# Patient Record
Sex: Male | Born: 1965
Health system: Southern US, Community
[De-identification: ages and names within clinical notes are randomized; demographics above are authoritative.]

## PROBLEM LIST (undated history)

## (undated) DIAGNOSIS — H7292 Unspecified perforation of tympanic membrane, left ear: Secondary | ICD-10-CM

## (undated) DIAGNOSIS — E8881 Metabolic syndrome: Secondary | ICD-10-CM

## (undated) DIAGNOSIS — E785 Hyperlipidemia, unspecified: Secondary | ICD-10-CM

## (undated) DIAGNOSIS — G4720 Circadian rhythm sleep disorder, unspecified type: Secondary | ICD-10-CM

## (undated) DIAGNOSIS — M1990 Unspecified osteoarthritis, unspecified site: Secondary | ICD-10-CM

## (undated) DIAGNOSIS — I10 Essential (primary) hypertension: Secondary | ICD-10-CM

## (undated) DIAGNOSIS — C4491 Basal cell carcinoma of skin, unspecified: Secondary | ICD-10-CM

## (undated) DIAGNOSIS — T7840XA Allergy, unspecified, initial encounter: Secondary | ICD-10-CM

## (undated) DIAGNOSIS — F419 Anxiety disorder, unspecified: Secondary | ICD-10-CM

## (undated) DIAGNOSIS — E669 Obesity, unspecified: Secondary | ICD-10-CM

## (undated) HISTORY — DX: Basal cell carcinoma of skin, unspecified: C44.91

## (undated) HISTORY — PX: PROSTATE BIOPSY: SHX241

## (undated) HISTORY — PX: TOTAL SHOULDER REPLACEMENT: SUR1217

## (undated) HISTORY — DX: Allergy, unspecified, initial encounter: T78.40XA

## (undated) HISTORY — DX: Metabolic syndrome: E88.810

## (undated) HISTORY — DX: Unspecified perforation of tympanic membrane, left ear: H72.92

## (undated) HISTORY — DX: Metabolic syndrome: E88.81

## (undated) HISTORY — DX: Essential (primary) hypertension: I10

## (undated) HISTORY — PX: SHOULDER ARTHROSCOPY: SHX128

## (undated) HISTORY — DX: Unspecified osteoarthritis, unspecified site: M19.90

## (undated) HISTORY — DX: Obesity, unspecified: E66.9

## (undated) HISTORY — DX: Hyperlipidemia, unspecified: E78.5

## (undated) HISTORY — DX: Circadian rhythm sleep disorder, unspecified type: G47.20

## (undated) HISTORY — DX: Anxiety disorder, unspecified: F41.9

---

## 2013-07-07 LAB — LIPID PANEL
CHOLESTEROL: 156 mg/dL (ref 0–200)
HDL: 32 mg/dL — AB (ref 35–70)
LDL CALC: 39 mg/dL
Triglycerides: 197 mg/dL — AB (ref 40–160)

## 2013-07-07 LAB — HEMOGLOBIN A1C: Hgb A1c MFr Bld: 5.8 % (ref 4.0–6.0)

## 2013-07-07 LAB — PSA: PSA: 1.8

## 2014-04-09 DIAGNOSIS — C4491 Basal cell carcinoma of skin, unspecified: Secondary | ICD-10-CM

## 2014-04-09 HISTORY — DX: Basal cell carcinoma of skin, unspecified: C44.91

## 2014-10-19 ENCOUNTER — Other Ambulatory Visit: Payer: Self-pay | Admitting: Family Medicine

## 2014-10-19 ENCOUNTER — Telehealth: Payer: Self-pay | Admitting: Family Medicine

## 2014-10-19 NOTE — Telephone Encounter (Signed)
Patient needs refills on Buspirone, Montelukast sodium, Lovastatin to be sent to [facility]. Patient is completely out and will be going out of town tomorrow for 3 wks.

## 2014-10-19 NOTE — Telephone Encounter (Signed)
Patient requesting refill. 

## 2014-10-19 NOTE — Addendum Note (Signed)
Addended by: Steele Sizer F on: 10/19/2014 10:12 PM   Modules accepted: Orders

## 2014-10-20 MED ORDER — MONTELUKAST SODIUM 10 MG PO TABS: 10.0000 mg | ORAL_TABLET | Freq: Every day | ORAL | Status: DC

## 2014-10-20 MED ORDER — ATORVASTATIN CALCIUM 40 MG PO TABS: 40.0000 mg | ORAL_TABLET | Freq: Every day | ORAL | Status: DC

## 2014-10-20 MED ORDER — BUSPIRONE HCL 15 MG PO TABS: 15.0000 mg | ORAL_TABLET | Freq: Two times a day (BID) | ORAL | Status: DC

## 2014-11-17 ENCOUNTER — Other Ambulatory Visit: Payer: Self-pay | Admitting: Family Medicine

## 2014-11-22 ENCOUNTER — Ambulatory Visit: Payer: Self-pay | Admitting: Family Medicine

## 2014-11-27 ENCOUNTER — Ambulatory Visit: Payer: Self-pay | Admitting: Family Medicine

## 2014-12-15 ENCOUNTER — Other Ambulatory Visit: Payer: Self-pay | Admitting: Family Medicine

## 2014-12-18 ENCOUNTER — Encounter: Payer: Self-pay | Admitting: Family Medicine

## 2014-12-18 DIAGNOSIS — H7290 Unspecified perforation of tympanic membrane, unspecified ear: Secondary | ICD-10-CM | POA: Insufficient documentation

## 2014-12-18 DIAGNOSIS — J302 Other seasonal allergic rhinitis: Secondary | ICD-10-CM | POA: Insufficient documentation

## 2014-12-18 DIAGNOSIS — C4491 Basal cell carcinoma of skin, unspecified: Secondary | ICD-10-CM | POA: Insufficient documentation

## 2014-12-18 DIAGNOSIS — T753XXA Motion sickness, initial encounter: Secondary | ICD-10-CM | POA: Insufficient documentation

## 2014-12-18 DIAGNOSIS — F411 Generalized anxiety disorder: Secondary | ICD-10-CM | POA: Insufficient documentation

## 2014-12-18 DIAGNOSIS — R739 Hyperglycemia, unspecified: Secondary | ICD-10-CM | POA: Insufficient documentation

## 2014-12-18 DIAGNOSIS — Z789 Other specified health status: Secondary | ICD-10-CM | POA: Insufficient documentation

## 2014-12-18 DIAGNOSIS — G4720 Circadian rhythm sleep disorder, unspecified type: Secondary | ICD-10-CM | POA: Insufficient documentation

## 2014-12-18 DIAGNOSIS — E785 Hyperlipidemia, unspecified: Secondary | ICD-10-CM | POA: Insufficient documentation

## 2014-12-18 DIAGNOSIS — Z9889 Other specified postprocedural states: Secondary | ICD-10-CM | POA: Insufficient documentation

## 2014-12-18 DIAGNOSIS — J3089 Other allergic rhinitis: Secondary | ICD-10-CM

## 2014-12-18 DIAGNOSIS — E669 Obesity, unspecified: Secondary | ICD-10-CM | POA: Insufficient documentation

## 2014-12-18 DIAGNOSIS — I10 Essential (primary) hypertension: Secondary | ICD-10-CM | POA: Insufficient documentation

## 2014-12-18 DIAGNOSIS — E8881 Metabolic syndrome: Secondary | ICD-10-CM | POA: Insufficient documentation

## 2014-12-22 ENCOUNTER — Other Ambulatory Visit: Payer: Self-pay | Admitting: Family Medicine

## 2014-12-26 LAB — LIPID PANEL
CHOLESTEROL: 150 mg/dL (ref 0–200)
HDL: 29 mg/dL — AB (ref 35–70)
LDL Cholesterol: 53 mg/dL
Triglycerides: 263 mg/dL — AB (ref 40–160)

## 2014-12-26 LAB — PSA: PSA: 2.3

## 2014-12-26 LAB — HEMOGLOBIN A1C: HEMOGLOBIN A1C: 6.1 % — AB (ref 4.0–6.0)

## 2015-01-04 ENCOUNTER — Encounter: Payer: Self-pay | Admitting: Family Medicine

## 2015-01-04 ENCOUNTER — Ambulatory Visit (INDEPENDENT_AMBULATORY_CARE_PROVIDER_SITE_OTHER): Payer: 59 | Admitting: Family Medicine

## 2015-01-04 VITALS — BP 128/68 | HR 69 | Temp 97.9°F | Resp 16 | Ht 74.0 in | Wt 238.5 lb

## 2015-01-04 DIAGNOSIS — E8881 Metabolic syndrome: Secondary | ICD-10-CM

## 2015-01-04 DIAGNOSIS — E785 Hyperlipidemia, unspecified: Secondary | ICD-10-CM | POA: Diagnosis not present

## 2015-01-04 DIAGNOSIS — R300 Dysuria: Secondary | ICD-10-CM

## 2015-01-04 DIAGNOSIS — F329 Major depressive disorder, single episode, unspecified: Secondary | ICD-10-CM

## 2015-01-04 DIAGNOSIS — Z23 Encounter for immunization: Secondary | ICD-10-CM | POA: Diagnosis not present

## 2015-01-04 DIAGNOSIS — F32A Depression, unspecified: Secondary | ICD-10-CM

## 2015-01-04 DIAGNOSIS — E669 Obesity, unspecified: Secondary | ICD-10-CM

## 2015-01-04 DIAGNOSIS — F520 Hypoactive sexual desire disorder: Secondary | ICD-10-CM | POA: Diagnosis not present

## 2015-01-04 DIAGNOSIS — F411 Generalized anxiety disorder: Secondary | ICD-10-CM | POA: Diagnosis not present

## 2015-01-04 DIAGNOSIS — J3089 Other allergic rhinitis: Secondary | ICD-10-CM

## 2015-01-04 DIAGNOSIS — I10 Essential (primary) hypertension: Secondary | ICD-10-CM | POA: Diagnosis not present

## 2015-01-04 DIAGNOSIS — J309 Allergic rhinitis, unspecified: Secondary | ICD-10-CM | POA: Diagnosis not present

## 2015-01-04 MED ORDER — BUSPIRONE HCL 15 MG PO TABS: 15.0000 mg | ORAL_TABLET | Freq: Two times a day (BID) | ORAL | Status: DC

## 2015-01-04 MED ORDER — ATORVASTATIN CALCIUM 40 MG PO TABS: 40.0000 mg | ORAL_TABLET | Freq: Every day | ORAL | Status: DC

## 2015-01-04 MED ORDER — MONTELUKAST SODIUM 10 MG PO TABS: 10.0000 mg | ORAL_TABLET | Freq: Every day | ORAL | Status: DC

## 2015-01-04 MED ORDER — TELMISARTAN-HCTZ 80-25 MG PO TABS
1.0000 | ORAL_TABLET | Freq: Every day | ORAL | Status: DC
Start: 2015-01-04 — End: 2015-08-19

## 2015-01-04 NOTE — Progress Notes (Signed)
Name: Gavin Scott   MRN: [id number]    DOB: Jul 01, 1965   Date:01/04/2015       Progress Note  Subjective  Chief Complaint  Chief Complaint  Patient presents with  . Medication Refill    3 month F/U  . Hypertension    Edema-bilateral feet and ankles(maybe due to flying for work), Headache, Checks BP at home and gets 120/80's  . Depression    Still taking Buspar, but could not tolerate Celexa due to making him extremely tired and sleepy-only could tolerate for a month and quit   . Hyperlipidemia  . Allergic Rhinitis     Still sniffing alot due to traveling and climate changes    HPI   HTN: taking medication , denies side effects of medication. BP has been at goal at home.  Depression/GAD: still very stressed, placing father in a long term memory care facility. He also has more responsibilities at work, son is going to [employer] and misses him. Worries about 56 yo daughter. He could not tolerate Celexa because it was causing him to get sleepy. Has occasional crying spells. Tolerating Buspar. He does not want to try any SSRI at this time. Denies suicidal thoughts or ideation  Dyslipidemia: LDL is at goal, HDL is very low.   AR: doing well on current medication   Dysuria: states occasionally has a burning sensation when he void. No hematuria, no groin pain, no flank pain  Obesity: travelling more for work, gaining weight, eating out on a regular basis  Decrease in Libido: he states he rather go to sleep than to have sex.    Patient Active Problem List   Diagnosis Date Noted  . Anxiety 12/18/2014  . Basal cell carcinoma 12/18/2014  . Benign essential HTN 12/18/2014  . Circadian rhythm disorder 12/18/2014  . History of prostate biopsy 12/18/2014  . Dyslipidemia 12/18/2014  . Blood glucose elevated 12/18/2014  . Obesity (BMI 30.0-34.9) 12/18/2014  . Allergic rhinitis 12/18/2014  . Ear drum perforation 12/18/2014  . Motion sickness 12/18/2014  . Engages in travel abroad  12/18/2014  . Dysmetabolic syndrome 96/22/2979    Past Surgical History  Procedure Laterality Date  . Prostate biopsy      [facility]-Negative   . Total shoulder replacement Right     Outpatient     Family History  Problem Relation Age of Onset  . Hyperlipidemia Mother   . Alcohol abuse Father   . Stroke Father   . Hyperlipidemia Father     Social History   Social History  . Marital Status: Married    Spouse Name: N/A  . Number of Children: N/A  . Years of Education: N/A   Occupational History  . Not on file.   Social History Main Topics  . Smoking status: Never Smoker   . Smokeless tobacco: Former Systems developer    Types: Snuff, Chew  . Alcohol Use: 0.0 oz/week    0 Standard drinks or equivalent per week     Comment: occasionally  . Drug Use: No  . Sexual Activity:    Partners: Female   Other Topics Concern  . Not on file   Social History Narrative     Current outpatient prescriptions:  .  aspirin 81 MG tablet, Take by mouth., Disp: , Rfl:  .  atorvastatin (LIPITOR) 40 MG tablet, Take 1 tablet (40 mg total) by mouth daily., Disp: 30 tablet, Rfl: 5 .  busPIRone (BUSPAR) 15 MG tablet, Take 1 tablet (15 mg  total) by mouth 2 (two) times daily., Disp: 60 tablet, Rfl: 5 .  fexofenadine (ALLEGRA ALLERGY) 180 MG tablet, Take by mouth., Disp: , Rfl:  .  fluticasone (FLONASE) 50 MCG/ACT nasal spray, Place into the nose., Disp: , Rfl:  .  montelukast (SINGULAIR) 10 MG tablet, Take 1 tablet (10 mg total) by mouth at bedtime., Disp: 30 tablet, Rfl: 5 .  telmisartan-hydrochlorothiazide (MICARDIS HCT) 80-25 MG per tablet, Take 1 tablet by mouth daily., Disp: 30 tablet, Rfl: 5 .  zolpidem (AMBIEN) 5 MG tablet, Take by mouth., Disp: , Rfl:   Allergies  Allergen Reactions  . Ace Inhibitors Cough  . Codeine Hives and Itching     ROS  Constitutional: Negative for fever or weight change.  Respiratory: Negative for cough and shortness of breath.   Cardiovascular: Negative  for chest pain or palpitations.  Gastrointestinal: Negative for abdominal pain, no bowel changes.  Musculoskeletal: Negative for gait problem or joint swelling.  Skin: Negative for rash.  Neurological: Negative for dizziness or headache. Occasional right hand tremors with movement, seldom - no balance problems or any other changes No other specific complaints in a complete review of systems (except as listed in HPI above).  Objective  Filed Vitals:   01/04/15 1109  BP: 128/68  Pulse: 69  Temp: 97.9 F (36.6 C)  TempSrc: Oral  Resp: 16  Height: 6\' 2"  (1.88 m)  Weight: 238 lb 8 oz (108.183 kg)  SpO2: 98%    Body mass index is 30.61 kg/(m^2).  Physical Exam  Constitutional: Patient appears well-developed and well-nourished. Obese  No distress.  HEENT: head atraumatic, normocephalic, pupils equal and reactive to light, neck supple, throat within normal limits Cardiovascular: Normal rate, regular rhythm and normal heart sounds.  No murmur heard. No BLE edema. Pulmonary/Chest: Effort normal and breath sounds normal. No respiratory distress. Abdominal: Soft.  There is no tenderness. Psychiatric: Patient has a normal mood and affect. behavior is normal. Judgment and thought content normal.  Recent Results (from the past 2160 hour(s))  Lipid panel     Status: Abnormal   Collection Time: 12/26/14 12:00 AM  Result Value Ref Range   Triglycerides 263 (A) 40 - 160 mg/dL   Cholesterol 150 0 - 200 mg/dL   HDL 29 (A) 35 - 70 mg/dL   LDL Cholesterol 53 mg/dL  Hemoglobin A1c     Status: Abnormal   Collection Time: 12/26/14 12:00 AM  Result Value Ref Range   Hgb A1c MFr Bld 6.1 (A) 4.0 - 6.0 %  PSA     Status: None   Collection Time: 12/26/14 12:00 AM  Result Value Ref Range   PSA 2.3     PHQ2/9: Depression screen PHQ 2/9 01/04/2015  Decreased Interest 2  Down, Depressed, Hopeless 2  PHQ - 2 Score 4  Altered sleeping 0  Tired, decreased energy 2  Change in appetite 2  Feeling  bad or failure about yourself  2  Trouble concentrating 0  Moving slowly or fidgety/restless 0  Suicidal thoughts 0  PHQ-9 Score 10  Difficult doing work/chores Somewhat difficult     Fall Risk: Fall Risk  01/04/2015  Falls in the past year? No      Functional Status Survey: Is the patient deaf or have difficulty hearing?: No Does the patient have difficulty seeing, even when wearing glasses/contacts?: No Does the patient have difficulty concentrating, remembering, or making decisions?: No Does the patient have difficulty walking or climbing stairs?: No Does the  patient have difficulty dressing or bathing?: No Does the patient have difficulty doing errands alone such as visiting a doctor's office or shopping?: No   Assessment & Plan  1. Benign essential HTN  continue medication  - telmisartan-hydrochlorothiazide (MICARDIS HCT) 80-25 MG per tablet; Take 1 tablet by mouth daily.  Dispense: 30 tablet; Refill: 5  2. Needs flu shot  - Flu Vaccine QUAD 36+ mos PF IM (Fluarix & Fluzone Quad PF)  3. Dysmetabolic syndrome  I will give him some dietary information   4. Obesity (BMI 30.0-34.9)  Discussed with the patient the risk posed by an increased BMI. Discussed importance of portion control, calorie counting and at least 150 minutes of physical activity weekly. Avoid sweet beverages and drink more water. Eat at least 6 servings of fruit and vegetables daily   5. GAD (generalized anxiety disorder)  - busPIRone (BUSPAR) 15 MG tablet; Take 1 tablet (15 mg total) by mouth 2 (two) times daily.  Dispense: 60 tablet; Refill: 5  6. Perennial allergic rhinitis  - montelukast (SINGULAIR) 10 MG tablet; Take 1 tablet (10 mg total) by mouth at bedtime.  Dispense: 30 tablet; Refill: 5  7. Dyslipidemia  Lipid panel shows low HDL : to improve HDL patient  needs to eat tree nuts ( pecans/pistachios/almonds ) four times weekly, eat fish two times weekly  and exercise  at least 150 minutes  per week - atorvastatin (LIPITOR) 40 MG tablet; Take 1 tablet (40 mg total) by mouth daily.  Dispense: 30 tablet; Refill: 5  8. Depression   likely the cause of lack of libido, but he wants hold off on medication. Advised a weekend get away with his wife to see if libido improves  9. Dysuria   advised to drink more water - Urine culture  10. Lack of libido  Likely from depression, we will hold off on labs

## 2015-01-05 LAB — URINE CULTURE: Organism ID, Bacteria: NO GROWTH

## 2015-01-07 NOTE — Progress Notes (Signed)
Pt.notified

## 2015-04-02 ENCOUNTER — Ambulatory Visit: Payer: 59 | Admitting: Family Medicine

## 2015-04-29 ENCOUNTER — Ambulatory Visit (INDEPENDENT_AMBULATORY_CARE_PROVIDER_SITE_OTHER): Payer: 59 | Admitting: Family Medicine

## 2015-04-29 ENCOUNTER — Encounter: Payer: Self-pay | Admitting: Family Medicine

## 2015-04-29 VITALS — BP 130/69 | HR 115 | Temp 99.8°F | Resp 19 | Ht 74.0 in | Wt 240.3 lb

## 2015-04-29 DIAGNOSIS — J0100 Acute maxillary sinusitis, unspecified: Secondary | ICD-10-CM

## 2015-04-29 DIAGNOSIS — H65116 Acute and subacute allergic otitis media (mucoid) (sanguinous) (serous), recurrent, bilateral: Secondary | ICD-10-CM

## 2015-04-29 DIAGNOSIS — R053 Chronic cough: Secondary | ICD-10-CM

## 2015-04-29 DIAGNOSIS — H6503 Acute serous otitis media, bilateral: Secondary | ICD-10-CM | POA: Diagnosis not present

## 2015-04-29 DIAGNOSIS — R05 Cough: Secondary | ICD-10-CM

## 2015-04-29 DIAGNOSIS — J3089 Other allergic rhinitis: Secondary | ICD-10-CM

## 2015-04-29 DIAGNOSIS — J40 Bronchitis, not specified as acute or chronic: Secondary | ICD-10-CM | POA: Diagnosis not present

## 2015-04-29 MED ORDER — DEXTROMETHORPHAN-GUAIFENESIN 5-100 MG/5ML PO LIQD: 2.0000 | Freq: Two times a day (BID) | ORAL | Status: DC

## 2015-04-29 MED ORDER — CEFTRIAXONE SODIUM 1 G IJ SOLR: 1.0000 g | Freq: Once | INTRAMUSCULAR | Status: DC

## 2015-04-29 MED ORDER — PREDNISONE 20 MG PO TABS: 20.0000 mg | ORAL_TABLET | Freq: Two times a day (BID) | ORAL | Status: DC

## 2015-04-29 MED ORDER — TRAMADOL HCL 50 MG PO TABS: 50.0000 mg | ORAL_TABLET | Freq: Three times a day (TID) | ORAL | Status: DC | PRN

## 2015-04-29 MED ORDER — LEVOFLOXACIN 500 MG PO TABS: 500.0000 mg | ORAL_TABLET | Freq: Every day | ORAL | Status: DC

## 2015-04-29 NOTE — Progress Notes (Signed)
Name: Gavin Scott   MRN: [id number]    DOB: 12/15/1965   Date:04/29/2015       Progress Note  Subjective  Chief Complaint  Chief Complaint  Patient presents with  . Acute Visit    Sinus    HPI  Sinusitis  Patient presents with greater than 7 day history of nasal congestion and drainage which is purulent in color. There is tenderness over the sinuses. There has been fever to 101 along with some associated chills on occasion. Usage of over-the-counter medications is not been affected. There is also accompanying cough productive of purulent sputum.  Bronchitis  Patient presents with a greater than 1 week history of cough productive of purulent sputum. The cough is irritating and keep the patient awake at night. There has no fever or chills.  Over-the-counter meds And completely effective.   Persistent cough. Patient has had a cough now for 3 or 4 weeks. It is productive of purulent sputum and has occasional skin blood tinge was very severe he's had chest wallpain associated.    Past Medical History  Diagnosis Date  . Hyperlipidemia   . Hypertension   . Obesity   . Perforation of left tympanic membrane   . Allergy   . Circadian rhythm disorder   . Metabolic syndrome   . Anxiety   . Basal cell carcinoma     Dr. Tyler Deis    Social History  Substance Use Topics  . Smoking status: Never Smoker   . Smokeless tobacco: Former Systems developer    Types: Snuff, Chew  . Alcohol Use: 0.0 oz/week    0 Standard drinks or equivalent per week     Comment: occasionally     Current outpatient prescriptions:  .  aspirin 81 MG tablet, Take by mouth., Disp: , Rfl:  .  atorvastatin (LIPITOR) 40 MG tablet, Take 1 tablet (40 mg total) by mouth daily., Disp: 30 tablet, Rfl: 5 .  busPIRone (BUSPAR) 15 MG tablet, Take 1 tablet (15 mg total) by mouth 2 (two) times daily., Disp: 60 tablet, Rfl: 5 .  fexofenadine (ALLEGRA ALLERGY) 180 MG tablet, Take by mouth., Disp: , Rfl:  .  fluticasone (FLONASE) 50  MCG/ACT nasal spray, Place into the nose., Disp: , Rfl:  .  telmisartan-hydrochlorothiazide (MICARDIS HCT) 80-25 MG per tablet, Take 1 tablet by mouth daily., Disp: 30 tablet, Rfl: 5 .  zolpidem (AMBIEN) 5 MG tablet, Take by mouth., Disp: , Rfl:   Allergies  Allergen Reactions  . Ace Inhibitors Cough  . Codeine Hives and Itching    Review of Systems  Constitutional: Positive for fever, chills and malaise/fatigue. Negative for weight loss.  HENT: Positive for congestion. Negative for hearing loss, sore throat and tinnitus.   Eyes: Negative for blurred vision, double vision and redness.  Respiratory: Positive for cough and sputum production. Negative for hemoptysis and shortness of breath.   Cardiovascular: Positive for chest pain. Negative for palpitations, orthopnea, claudication and leg swelling.  Gastrointestinal: Negative for heartburn, nausea, vomiting, diarrhea, constipation and blood in stool.  Genitourinary: Negative for dysuria, urgency, frequency and hematuria.  Musculoskeletal: Positive for myalgias. Negative for back pain, joint pain, falls and neck pain.  Skin: Negative for itching.  Neurological: Negative for dizziness, tingling, tremors, focal weakness, seizures, loss of consciousness, weakness and headaches.  Endo/Heme/Allergies: Does not bruise/bleed easily.  Psychiatric/Behavioral: Negative for depression and substance abuse. The patient is not nervous/anxious and does not have insomnia.      Objective  Filed Vitals:  04/29/15 1327  BP: 130/69  Pulse: 115  Temp: 99.8 F (37.7 C)  TempSrc: Oral  Resp: 19  Height: 6\' 2"  (1.88 m)  Weight: 240 lb 4.8 oz (108.999 kg)  SpO2: 93%     Physical Exam  Constitutional: He is oriented to person, place, and time and well-developed, well-nourished, and in no distress.  HENT:  Head: Normocephalic.  Eyes: EOM are normal. Pupils are equal, round, and reactive to light.  Bilateral frontal and maxillary sinus tenderness.  The nasal turbinates red swollen with mucopurulent discharge pharynx is injected with modest amount of discharge. Cervical no fluid on the left is tender. Ears show bilateral dullness and decreased mobility.  Neck: Normal range of motion. Neck supple. No thyromegaly present.  Cardiovascular: Normal rate, regular rhythm and normal heart sounds.   No murmur heard. Pulmonary/Chest: Effort normal and breath sounds normal. No respiratory distress. He has no wheezes.  Abdominal: Soft. Bowel sounds are normal.  Musculoskeletal: Normal range of motion. He exhibits no edema.  Lymphadenopathy:    He has no cervical adenopathy.  Neurological: He is alert and oriented to person, place, and time. No cranial nerve deficit. Gait normal. Coordination normal.  Skin: Skin is warm and dry. No rash noted.  Psychiatric: Affect and judgment normal.       Assessment & Plan   1. Acute maxillary sinusitis, recurrence not specified Persistent - predniSONE (DELTASONE) 20 MG tablet; Take 1 tablet (20 mg total) by mouth 2 (two) times daily with a meal.  Dispense: 10 tablet; Refill: 0 - levofloxacin (LEVAQUIN) 500 MG tablet; Take 1 tablet (500 mg total) by mouth daily.  Dispense: 10 tablet; Refill: 0 - cefTRIAXone (ROCEPHIN) injection 1 g; Inject 1 g into the muscle once.  2. Bronchitis Persistent - predniSONE (DELTASONE) 20 MG tablet; Take 1 tablet (20 mg total) by mouth 2 (two) times daily with a meal.  Dispense: 10 tablet; Refill: 0 - levofloxacin (LEVAQUIN) 500 MG tablet; Take 1 tablet (500 mg total) by mouth daily.  Dispense: 10 tablet; Refill: 0 - cefTRIAXone (ROCEPHIN) injection 1 g; Inject 1 g into the muscle once.  3. Recurrent acute allergic otitis media of both ears   4. Persistent cough Persistent for several months - traMADol (ULTRAM) 50 MG tablet; Take 1 tablet (50 mg total) by mouth every 8 (eight) hours as needed.  Dispense: 30 tablet; Refill: 0 - predniSONE (DELTASONE) 20 MG tablet; Take 1  tablet (20 mg total) by mouth 2 (two) times daily with a meal.  Dispense: 10 tablet; Refill: 0 - Dextromethorphan-Guaifenesin (DELSYM [facility]/CHEST CONG DM CHILD) 5-100 MG/5ML LIQD; Take 2 Doses by mouth 2 (two) times daily.  Dispense: 1 Bottle; Refill: 0 - cefTRIAXone (ROCEPHIN) injection 1 g; Inject 1 g into the muscle once.  5. Other allergic rhinitis Moderate  6. Bilateral acute serous otitis media, recurrence not specified Moderate in severity - cefTRIAXone (ROCEPHIN) injection 1 g; Inject 1 g into the muscle once.

## 2015-04-30 ENCOUNTER — Ambulatory Visit: Payer: 59 | Admitting: Family Medicine

## 2015-05-02 ENCOUNTER — Encounter: Payer: Self-pay | Admitting: Family Medicine

## 2015-05-02 ENCOUNTER — Ambulatory Visit (INDEPENDENT_AMBULATORY_CARE_PROVIDER_SITE_OTHER): Payer: 59 | Admitting: Family Medicine

## 2015-05-02 VITALS — BP 120/70 | HR 84 | Temp 98.9°F | Resp 18 | Ht 74.0 in | Wt 240.4 lb

## 2015-05-02 DIAGNOSIS — J40 Bronchitis, not specified as acute or chronic: Secondary | ICD-10-CM

## 2015-05-02 DIAGNOSIS — R053 Chronic cough: Secondary | ICD-10-CM

## 2015-05-02 DIAGNOSIS — R05 Cough: Secondary | ICD-10-CM

## 2015-05-02 DIAGNOSIS — J0100 Acute maxillary sinusitis, unspecified: Secondary | ICD-10-CM

## 2015-05-02 MED ORDER — CEFTRIAXONE SODIUM 1 G IJ SOLR: 1.0000 g | Freq: Once | INTRAMUSCULAR | Status: DC

## 2015-05-02 NOTE — Progress Notes (Signed)
Name: Gavin Scott   MRN: [id number]    DOB: 1965/11/01   Date:05/02/2015       Progress Note  Subjective  Chief Complaint  Chief Complaint  Patient presents with  . Bronchitis     follow up/ feeling better  . Otalgia    HPI  Sinusitis and bronchitis  Since being seen 2 days ago and given a shot of Rocephin and start placed on Levaquin patient's noticed some improvement of his symptomatology. He does the lower having chills and fever and cough is been less productive though he still. Phlegm in his tracheal area. Sinus drainage is improved no shaking chills. There  Past Medical History  Diagnosis Date  . Hyperlipidemia   . Hypertension   . Obesity   . Perforation of left tympanic membrane   . Allergy   . Circadian rhythm disorder   . Metabolic syndrome   . Anxiety   . Basal cell carcinoma     Dr. Tyler Deis    Social History  Substance Use Topics  . Smoking status: Never Smoker   . Smokeless tobacco: Former Systems developer    Types: Snuff, Chew  . Alcohol Use: 0.0 oz/week    0 Standard drinks or equivalent per week     Comment: occasionally     Current outpatient prescriptions:  .  aspirin 81 MG tablet, Take by mouth., Disp: , Rfl:  .  atorvastatin (LIPITOR) 40 MG tablet, Take 1 tablet (40 mg total) by mouth daily., Disp: 30 tablet, Rfl: 5 .  busPIRone (BUSPAR) 15 MG tablet, Take 1 tablet (15 mg total) by mouth 2 (two) times daily., Disp: 60 tablet, Rfl: 5 .  Dextromethorphan-Guaifenesin (DELSYM [facility]/CHEST CONG DM CHILD) 5-100 MG/5ML LIQD, Take 2 Doses by mouth 2 (two) times daily., Disp: 1 Bottle, Rfl: 0 .  fexofenadine (ALLEGRA ALLERGY) 180 MG tablet, Take by mouth., Disp: , Rfl:  .  fluticasone (FLONASE) 50 MCG/ACT nasal spray, Place into the nose., Disp: , Rfl:  .  levofloxacin (LEVAQUIN) 500 MG tablet, Take 1 tablet (500 mg total) by mouth daily., Disp: 10 tablet, Rfl: 0 .  predniSONE (DELTASONE) 20 MG tablet, Take 1 tablet (20 mg total) by mouth 2 (two) times daily with a  meal., Disp: 10 tablet, Rfl: 0 .  telmisartan-hydrochlorothiazide (MICARDIS HCT) 80-25 MG per tablet, Take 1 tablet by mouth daily., Disp: 30 tablet, Rfl: 5 .  traMADol (ULTRAM) 50 MG tablet, Take 1 tablet (50 mg total) by mouth every 8 (eight) hours as needed., Disp: 30 tablet, Rfl: 0 .  zolpidem (AMBIEN) 5 MG tablet, Take by mouth., Disp: , Rfl:   Current facility-administered medications:  .  cefTRIAXone (ROCEPHIN) injection 1 g, 1 g, Intramuscular, Once, Ashok Norris, MD .  cefTRIAXone (ROCEPHIN) injection 1 g, 1 g, Intramuscular, Once, Ashok Norris, MD  Allergies  Allergen Reactions  . Ace Inhibitors Cough  . Codeine Hives and Itching    Review of Systems  Constitutional: Positive for fever. Negative for chills and weight loss.  HENT: Positive for congestion. Negative for hearing loss, sore throat and tinnitus.   Eyes: Negative for blurred vision, double vision and redness.  Respiratory: Positive for cough and sputum production. Negative for hemoptysis and shortness of breath.   Cardiovascular: Negative for chest pain, palpitations, orthopnea, claudication and leg swelling.  Gastrointestinal: Negative for heartburn, nausea, vomiting, diarrhea, constipation and blood in stool.  Genitourinary: Negative for dysuria, urgency, frequency and hematuria.  Musculoskeletal: Negative for myalgias, back pain, joint pain, falls  and neck pain.  Skin: Negative for itching.  Neurological: Positive for headaches. Negative for dizziness, tingling, tremors, focal weakness, seizures, loss of consciousness and weakness.  Endo/Heme/Allergies: Does not bruise/bleed easily.  Psychiatric/Behavioral: Negative for depression and substance abuse. The patient is not nervous/anxious and does not have insomnia.      Objective  Filed Vitals:   05/02/15 0728  BP: 120/70  Pulse: 84  Temp: 98.9 F (37.2 C)  TempSrc: Oral  Resp: 18  Height: 6\' 2"  (1.88 m)  Weight: 240 lb 6.4 oz (109.045 kg)  SpO2:  96%     Physical Exam  Constitutional: He is oriented to person, place, and time and well-developed, well-nourished, and in no distress.  HENT:  Head: Normocephalic.  Decrease in nasal turbinate swelling and drainage and erythema  Eyes: EOM are normal. Pupils are equal, round, and reactive to light.  Neck: Normal range of motion. Neck supple. No thyromegaly present.  Cardiovascular: Normal rate, regular rhythm and normal heart sounds.   No murmur heard. Pulmonary/Chest: Effort normal and breath sounds normal. No respiratory distress. He has no wheezes.  Abdominal: Soft. Bowel sounds are normal.  Musculoskeletal: Normal range of motion. He exhibits no edema.  Lymphadenopathy:    He has no cervical adenopathy.  Neurological: He is alert and oriented to person, place, and time. No cranial nerve deficit. Gait normal. Coordination normal.  Skin: Skin is warm and dry. No rash noted.  Psychiatric: Affect and judgment normal.      Assessment & Plan  1. Bronchitis Improving but not resolved. We'll give another gram of Rocephin in view of his out of town travel in a few days - cefTRIAXone (ROCEPHIN) injection 1 g; Inject 1 g into the muscle once.  2. Acute maxillary sinusitis, recurrence not specified Improvement resolved  3. Persistent cough Continue with present regimen

## 2015-07-05 ENCOUNTER — Ambulatory Visit: Payer: 59 | Admitting: Family Medicine

## 2015-07-12 ENCOUNTER — Ambulatory Visit: Payer: 59 | Admitting: Family Medicine

## 2015-08-11 ENCOUNTER — Other Ambulatory Visit: Payer: Self-pay | Admitting: Family Medicine

## 2015-08-12 NOTE — Telephone Encounter (Signed)
Left voice message informing patient prescription has been sent to pharmacy & we need to schedule appt

## 2015-08-19 ENCOUNTER — Ambulatory Visit (INDEPENDENT_AMBULATORY_CARE_PROVIDER_SITE_OTHER): Payer: 59 | Admitting: Family Medicine

## 2015-08-19 ENCOUNTER — Encounter: Payer: Self-pay | Admitting: Family Medicine

## 2015-08-19 VITALS — BP 128/86 | HR 66 | Temp 98.6°F | Resp 16 | Ht 74.0 in | Wt 240.3 lb

## 2015-08-19 DIAGNOSIS — E669 Obesity, unspecified: Secondary | ICD-10-CM | POA: Diagnosis not present

## 2015-08-19 DIAGNOSIS — E785 Hyperlipidemia, unspecified: Secondary | ICD-10-CM

## 2015-08-19 DIAGNOSIS — F411 Generalized anxiety disorder: Secondary | ICD-10-CM

## 2015-08-19 DIAGNOSIS — Z23 Encounter for immunization: Secondary | ICD-10-CM

## 2015-08-19 DIAGNOSIS — J3089 Other allergic rhinitis: Secondary | ICD-10-CM

## 2015-08-19 DIAGNOSIS — E8881 Metabolic syndrome: Secondary | ICD-10-CM

## 2015-08-19 DIAGNOSIS — K219 Gastro-esophageal reflux disease without esophagitis: Secondary | ICD-10-CM | POA: Diagnosis not present

## 2015-08-19 DIAGNOSIS — R351 Nocturia: Secondary | ICD-10-CM

## 2015-08-19 DIAGNOSIS — J309 Allergic rhinitis, unspecified: Secondary | ICD-10-CM | POA: Diagnosis not present

## 2015-08-19 DIAGNOSIS — C4491 Basal cell carcinoma of skin, unspecified: Secondary | ICD-10-CM | POA: Diagnosis not present

## 2015-08-19 DIAGNOSIS — I10 Essential (primary) hypertension: Secondary | ICD-10-CM

## 2015-08-19 DIAGNOSIS — J302 Other seasonal allergic rhinitis: Secondary | ICD-10-CM

## 2015-08-19 MED ORDER — MONTELUKAST SODIUM 10 MG PO TABS: 10.0000 mg | ORAL_TABLET | Freq: Every day | ORAL | Status: DC

## 2015-08-19 MED ORDER — AZELASTINE HCL 0.1 % NA SOLN: 1.0000 | Freq: Two times a day (BID) | NASAL | Status: DC

## 2015-08-19 MED ORDER — ATORVASTATIN CALCIUM 40 MG PO TABS: 40.0000 mg | ORAL_TABLET | Freq: Every day | ORAL | Status: DC

## 2015-08-19 MED ORDER — AZELASTINE HCL 0.1 % NA SOLN: 2.0000 | Freq: Two times a day (BID) | NASAL | Status: DC

## 2015-08-19 MED ORDER — TELMISARTAN-HCTZ 80-25 MG PO TABS: 1.0000 | ORAL_TABLET | Freq: Every day | ORAL | Status: DC

## 2015-08-19 MED ORDER — BUSPIRONE HCL 15 MG PO TABS: 15.0000 mg | ORAL_TABLET | Freq: Two times a day (BID) | ORAL | Status: DC

## 2015-08-19 NOTE — Patient Instructions (Signed)
Food Choices for Gastroesophageal Reflux Disease, Adult When you have gastroesophageal reflux disease (GERD), the foods you eat and your eating habits are very important. Choosing the right foods can help ease the discomfort of GERD. WHAT GENERAL GUIDELINES DO I NEED TO FOLLOW?  Choose fruits, vegetables, whole grains, low-fat dairy products, and low-fat meat, fish, and poultry.  Limit fats such as oils, salad dressings, butter, nuts, and avocado.  Keep a food diary to identify foods that cause symptoms.  Avoid foods that cause reflux. These may be different for different people.  Eat frequent small meals instead of three large meals each day.  Eat your meals slowly, in a relaxed setting.  Limit fried foods.  Cook foods using methods other than frying.  Avoid drinking alcohol.  Avoid drinking large amounts of liquids with your meals.  Avoid bending over or lying down until 2-3 hours after eating. WHAT FOODS ARE NOT RECOMMENDED? The following are some foods and drinks that may worsen your symptoms: Vegetables Tomatoes. Tomato juice. Tomato and spaghetti sauce. Chili peppers. Onion and garlic. Horseradish. Fruits Oranges, grapefruit, and lemon (fruit and juice). Meats High-fat meats, fish, and poultry. This includes hot dogs, ribs, ham, sausage, salami, and bacon. Dairy Whole milk and chocolate milk. Sour cream. Cream. Butter. Ice cream. Cream cheese.  Beverages Coffee and tea, with or without caffeine. Carbonated beverages or energy drinks. Condiments Hot sauce. Barbecue sauce.  Sweets/Desserts Chocolate and cocoa. Donuts. Peppermint and spearmint. Fats and Oils High-fat foods, including French fries and potato chips. Other Vinegar. Strong spices, such as black pepper, white pepper, red pepper, cayenne, curry powder, cloves, ginger, and chili powder. The items listed above may not be a complete list of foods and beverages to avoid. Contact your dietitian for more  information.   This information is not intended to replace advice given to you by your health care provider. Make sure you discuss any questions you have with your health care provider.   Document Released: 03/30/2005 Document Revised: 04/20/2014 Document Reviewed: 02/01/2013 Elsevier Interactive Patient Education 2016 [employer]  

## 2015-08-19 NOTE — Progress Notes (Signed)
Name: Gavin Scott   MRN: [id number]    DOB: 1966-01-15   Date:08/19/2015       Progress Note  Subjective  Chief Complaint  Chief Complaint  Patient presents with  . Medication Refill    6 month F/U  . Hypertension    Checks BP outside office and runs about 125/80's  . Hyperlipidemia  . Allergic Rhinitis     Well controlled with nasal spray and medication  . Insomnia    Takes only when needed when traveling for work to [place].    HPI  HTN: taking medication, denies chest pain or palpitation.   Hyperlipidemia: low HDL, advised to change diet and exercise more, LDL was at goal, taking Lipitor  AR: symptoms are worse this time of the year, using otc nasal spray also. He states he has worsening of nasal congestion, no current sneezing or cough  Insomnia: only takes medication prn  Depression/GAD: he is doing much better, his step-parents and father are all in a nursing home. His mother is coming a few weeks to spend time with him. He states depression has resolved, doing well on Buspirone.   Nocturia: he has noticed nocturia for the past 6 months, no dysuria, no hematuria. Once of twice per night, but does not want medication  GERD: he states over the past 6 months he noticed nocturnal regurgitation, but resolved with use of a special pillow. No change in appetite or weight loss.    Patient Active Problem List   Diagnosis Date Noted  . Depression 01/04/2015  . Generalized anxiety disorder 12/18/2014  . Basal cell carcinoma 12/18/2014  . Benign essential HTN 12/18/2014  . Circadian rhythm disorder 12/18/2014  . History of prostate biopsy 12/18/2014  . Dyslipidemia 12/18/2014  . Blood glucose elevated 12/18/2014  . Obesity (BMI 30.0-34.9) 12/18/2014  . Perennial allergic rhinitis with seasonal variation 12/18/2014  . Ear drum perforation 12/18/2014  . Motion sickness 12/18/2014  . Engages in travel abroad 12/18/2014  . Dysmetabolic syndrome 99991111    Past Surgical  History  Procedure Laterality Date  . Prostate biopsy      [facility]-Negative   . Total shoulder replacement Right     Outpatient     Family History  Problem Relation Age of Onset  . Hyperlipidemia Mother   . Alcohol abuse Father   . Stroke Father   . Hyperlipidemia Father     Social History   Social History  . Marital Status: Married    Spouse Name: N/A  . Number of Children: N/A  . Years of Education: N/A   Occupational History  . Not on file.   Social History Main Topics  . Smoking status: Never Smoker   . Smokeless tobacco: Former Systems developer    Types: Snuff, Chew  . Alcohol Use: 0.0 oz/week    0 Standard drinks or equivalent per week     Comment: occasionally  . Drug Use: No  . Sexual Activity:    Partners: Female   Other Topics Concern  . Not on file   Social History Narrative     Current outpatient prescriptions:  .  atorvastatin (LIPITOR) 40 MG tablet, Take 1 tablet (40 mg total) by mouth daily., Disp: 30 tablet, Rfl: 5 .  busPIRone (BUSPAR) 15 MG tablet, Take 1 tablet (15 mg total) by mouth 2 (two) times daily., Disp: 60 tablet, Rfl: 5 .  fexofenadine (ALLEGRA ALLERGY) 180 MG tablet, Take by mouth., Disp: , Rfl:  .  fluticasone (  FLONASE) 50 MCG/ACT nasal spray, Place into the nose., Disp: , Rfl:  .  montelukast (SINGULAIR) 10 MG tablet, Take 1 tablet (10 mg total) by mouth at bedtime., Disp: 30 tablet, Rfl: 5 .  RA ASPIRIN EC 81 MG EC tablet, , Disp: , Rfl: 1 .  telmisartan-hydrochlorothiazide (MICARDIS HCT) 80-25 MG tablet, Take 1 tablet by mouth daily., Disp: 30 tablet, Rfl: 5 .  zolpidem (AMBIEN) 5 MG tablet, Take by mouth., Disp: , Rfl:  .  azelastine (ASTELIN) 0.1 % nasal spray, Place 2 sprays into both nostrils 2 (two) times daily. Use in each nostril as directed, Disp: 30 mL, Rfl: 5  Current facility-administered medications:  .  cefTRIAXone (ROCEPHIN) injection 1 g, 1 g, Intramuscular, Once, Ashok Norris, MD .  cefTRIAXone (ROCEPHIN)  injection 1 g, 1 g, Intramuscular, Once, Ashok Norris, MD  Allergies  Allergen Reactions  . Ace Inhibitors Cough  . Codeine Hives and Itching     ROS  Constitutional: Negative for fever or weight change.  Respiratory: Negative for cough and shortness of breath.   Cardiovascular: Negative for chest pain or palpitations.  Gastrointestinal: Negative for abdominal pain, no bowel changes.  Musculoskeletal: Negative for gait problem or joint swelling.  Skin: Negative for rash.  Neurological: Negative for dizziness or headache.  No other specific complaints in a complete review of systems (except as listed in HPI above).  Objective  Filed Vitals:   08/19/15 0941  BP: 128/86  Pulse: 66  Temp: 98.6 F (37 C)  TempSrc: Oral  Resp: 16  Height: 6\' 2"  (1.88 m)  Weight: 240 lb 4.8 oz (108.999 kg)  SpO2: 94%    Body mass index is 30.84 kg/(m^2).  Physical Exam  Constitutional: Patient appears well-developed and well-nourished. Obese No distress.  HEENT: head atraumatic, normocephalic, pupils equal and reactive to light,  neck supple, throat within normal limits Cardiovascular: Normal rate, regular rhythm and normal heart sounds.  No murmur heard. No BLE edema. Pulmonary/Chest: Effort normal and breath sounds normal. No respiratory distress. Abdominal: Soft.  There is no tenderness. Psychiatric: Patient has a normal mood and affect. behavior is normal. Judgment and thought content normal.  PHQ2/9: Depression screen [facility] 2/9 08/19/2015 05/02/2015 04/29/2015 01/04/2015  Decreased Interest 0 0 0 2  Down, Depressed, Hopeless 0 0 0 2  PHQ - 2 Score 0 0 0 4  Altered sleeping - - - 0  Tired, decreased energy - - - 2  Change in appetite - - - 2  Feeling bad or failure about yourself  - - - 2  Trouble concentrating - - - 0  Moving slowly or fidgety/restless - - - 0  Suicidal thoughts - - - 0  PHQ-9 Score - - - 10  Difficult doing work/chores - - - Somewhat difficult     Fall  Risk: Fall Risk  08/19/2015 05/02/2015 04/29/2015 01/04/2015  Falls in the past year? No No No No     Functional Status Survey: Is the patient deaf or have difficulty hearing?: No Does the patient have difficulty seeing, even when wearing glasses/contacts?: No Does the patient have difficulty concentrating, remembering, or making decisions?: No Does the patient have difficulty walking or climbing stairs?: No Does the patient have difficulty dressing or bathing?: No Does the patient have difficulty doing errands alone such as visiting a doctor's office or shopping?: No    Assessment & Plan  1. Benign essential HTN  - telmisartan-hydrochlorothiazide (MICARDIS HCT) 80-25 MG tablet; Take 1 tablet  by mouth daily.  Dispense: 30 tablet; Refill: 5 - Comprehensive metabolic panel  2. Dysmetabolic syndrome  - Hemoglobin A1c  3. GAD (generalized anxiety disorder)  - busPIRone (BUSPAR) 15 MG tablet; Take 1 tablet (15 mg total) by mouth 2 (two) times daily.  Dispense: 60 tablet; Refill: 5  4. Obesity (BMI 30.0-34.9)  Discussed with the patient the risk posed by an increased BMI. Discussed importance of portion control, calorie counting and at least 150 minutes of physical activity weekly. Avoid sweet beverages and drink more water. Eat at least 6 servings of fruit and vegetables daily   5. Dyslipidemia  - atorvastatin (LIPITOR) 40 MG tablet; Take 1 tablet (40 mg total) by mouth daily.  Dispense: 30 tablet; Refill: 5 - Lipid panel  6. Basal cell carcinoma  Continue   7. Perennial allergic rhinitis with seasonal variation  - montelukast (SINGULAIR) 10 MG tablet; Take 1 tablet (10 mg total) by mouth at bedtime.  Dispense: 30 tablet; Refill: 5 - azelastine (ASTELIN) 0.1 % nasal spray; Place 2 sprays into both nostrils 2 (two) times daily. Use in each nostril as directed  Dispense: 30 mL; Refill: 5  8. Need for hepatitis B vaccination  - Hepatitis B vaccine adult IM  9. Nocturia  -  Urine culture

## 2015-08-20 LAB — URINE CULTURE: ORGANISM ID, BACTERIA: NO GROWTH

## 2015-09-16 ENCOUNTER — Other Ambulatory Visit: Payer: Self-pay | Admitting: Family Medicine

## 2015-09-17 NOTE — Telephone Encounter (Signed)
Patient requesting refill. 

## 2016-01-01 ENCOUNTER — Other Ambulatory Visit: Payer: Self-pay

## 2016-01-01 ENCOUNTER — Encounter: Payer: Self-pay | Admitting: Family Medicine

## 2016-01-01 ENCOUNTER — Ambulatory Visit (INDEPENDENT_AMBULATORY_CARE_PROVIDER_SITE_OTHER): Payer: 59 | Admitting: Family Medicine

## 2016-01-01 VITALS — BP 124/84 | HR 74 | Temp 98.3°F | Resp 18 | Wt 241.4 lb

## 2016-01-01 DIAGNOSIS — Z789 Other specified health status: Secondary | ICD-10-CM | POA: Diagnosis not present

## 2016-01-01 DIAGNOSIS — F411 Generalized anxiety disorder: Secondary | ICD-10-CM | POA: Diagnosis not present

## 2016-01-01 DIAGNOSIS — I10 Essential (primary) hypertension: Secondary | ICD-10-CM | POA: Diagnosis not present

## 2016-01-01 DIAGNOSIS — J3089 Other allergic rhinitis: Secondary | ICD-10-CM

## 2016-01-01 DIAGNOSIS — J329 Chronic sinusitis, unspecified: Secondary | ICD-10-CM

## 2016-01-01 DIAGNOSIS — Z125 Encounter for screening for malignant neoplasm of prostate: Secondary | ICD-10-CM | POA: Diagnosis not present

## 2016-01-01 DIAGNOSIS — Z23 Encounter for immunization: Secondary | ICD-10-CM

## 2016-01-01 DIAGNOSIS — E8881 Metabolic syndrome: Secondary | ICD-10-CM | POA: Diagnosis not present

## 2016-01-01 DIAGNOSIS — B349 Viral infection, unspecified: Secondary | ICD-10-CM

## 2016-01-01 DIAGNOSIS — J309 Allergic rhinitis, unspecified: Secondary | ICD-10-CM

## 2016-01-01 DIAGNOSIS — J302 Other seasonal allergic rhinitis: Secondary | ICD-10-CM

## 2016-01-01 DIAGNOSIS — E785 Hyperlipidemia, unspecified: Secondary | ICD-10-CM

## 2016-01-01 DIAGNOSIS — B9789 Other viral agents as the cause of diseases classified elsewhere: Secondary | ICD-10-CM

## 2016-01-01 MED ORDER — BENZONATATE 100 MG PO CAPS: 100.0000 mg | ORAL_CAPSULE | Freq: Two times a day (BID) | ORAL | 0 refills | Status: DC | PRN

## 2016-01-01 MED ORDER — TELMISARTAN-HCTZ 80-25 MG PO TABS: 1.0000 | ORAL_TABLET | Freq: Every day | ORAL | 5 refills | Status: DC

## 2016-01-01 MED ORDER — MONTELUKAST SODIUM 10 MG PO TABS: 10.0000 mg | ORAL_TABLET | Freq: Every day | ORAL | 5 refills | Status: DC

## 2016-01-01 MED ORDER — AMOXICILLIN-POT CLAVULANATE 875-125 MG PO TABS: 1.0000 | ORAL_TABLET | Freq: Two times a day (BID) | ORAL | 0 refills | Status: DC

## 2016-01-01 MED ORDER — BUSPIRONE HCL 15 MG PO TABS: 15.0000 mg | ORAL_TABLET | Freq: Two times a day (BID) | ORAL | 5 refills | Status: DC

## 2016-01-01 MED ORDER — ATORVASTATIN CALCIUM 40 MG PO TABS: 40.0000 mg | ORAL_TABLET | Freq: Every day | ORAL | 5 refills | Status: DC

## 2016-01-01 NOTE — Progress Notes (Signed)
Name: Gavin Scott   MRN: [id number]    DOB: Aug 27, 1965   Date:01/01/2016       Progress Note  Subjective  Chief Complaint  Chief Complaint  Patient presents with  . Sinusitis    Sinus infection started monday, congestion, lot of drainge, and low grade fever    HPI  HTN: taking medication, denies chest pain or palpitation. , bp is at goal   Hyperlipidemia: low HDL, advised to change diet and exercise more, LDL was at goal, taking Lipitor. He is due for labs  Insomnia: only takes medication prn , mostly when he travels  Depression/GAD: he is doing much better, his step-parents and father are all in a nursing home. Marland Kitchen He states depression has resolved, doing well on Buspirone. He states worrying is under control,  No problems sleeping and appetite is good  Nocturia: he has noticed nocturia for the past 6 months, no dysuria, no hematuria. Once per night  but does not want medication, we will recheck PSA, needs a CPE  Sinusitis: he states that over the past 5 days he developed nasal congestion and a fever two days ago, watery eyes, post-nasal drainage, initially green drainage but now clear and feeling better, he is concerned because he is going to [place] next week, advised to continue otc medication and only take antibiotics if needed.    Patient Active Problem List   Diagnosis Date Noted  . GERD (gastroesophageal reflux disease) 08/19/2015  . Depression 01/04/2015  . Generalized anxiety disorder 12/18/2014  . Basal cell carcinoma 12/18/2014  . Benign essential HTN 12/18/2014  . Circadian rhythm disorder 12/18/2014  . History of prostate biopsy 12/18/2014  . Dyslipidemia 12/18/2014  . Blood glucose elevated 12/18/2014  . Obesity (BMI 30.0-34.9) 12/18/2014  . Perennial allergic rhinitis with seasonal variation 12/18/2014  . Ear drum perforation 12/18/2014  . Motion sickness 12/18/2014  . Engages in travel abroad 12/18/2014  . Dysmetabolic syndrome 99991111    Past  Surgical History:  Procedure Laterality Date  . PROSTATE BIOPSY     [facility]-Negative   . TOTAL SHOULDER REPLACEMENT Right    Outpatient     Family History  Problem Relation Age of Onset  . Hyperlipidemia Mother   . Alcohol abuse Father   . Stroke Father   . Hyperlipidemia Father     Social History   Social History  . Marital status: Married    Spouse name: N/A  . Number of children: N/A  . Years of education: N/A   Occupational History  . Not on file.   Social History Main Topics  . Smoking status: Never Smoker  . Smokeless tobacco: Former Systems developer    Types: Snuff, Chew  . Alcohol use 0.0 oz/week     Comment: occasionally  . Drug use: No  . Sexual activity: Yes    Partners: Female   Other Topics Concern  . Not on file   Social History Narrative  . No narrative on file     Current Outpatient Prescriptions:  .  amoxicillin-clavulanate (AUGMENTIN) 875-125 MG tablet, Take 1 tablet by mouth 2 (two) times daily., Disp: 20 tablet, Rfl: 0 .  atorvastatin (LIPITOR) 40 MG tablet, Take 1 tablet (40 mg total) by mouth daily., Disp: 30 tablet, Rfl: 5 .  azelastine (ASTELIN) 0.1 % nasal spray, Place 2 sprays into both nostrils 2 (two) times daily. Use in each nostril as directed, Disp: 30 mL, Rfl: 5 .  benzonatate (TESSALON) 100 MG capsule, Take 1-2  capsules (100-200 mg total) by mouth 2 (two) times daily as needed., Disp: 40 capsule, Rfl: 0 .  busPIRone (BUSPAR) 15 MG tablet, Take 1 tablet (15 mg total) by mouth 2 (two) times daily., Disp: 60 tablet, Rfl: 5 .  fexofenadine (ALLEGRA ALLERGY) 180 MG tablet, Take by mouth., Disp: , Rfl:  .  fluticasone (FLONASE) 50 MCG/ACT nasal spray, Place into the nose., Disp: , Rfl:  .  montelukast (SINGULAIR) 10 MG tablet, Take 1 tablet (10 mg total) by mouth at bedtime., Disp: 30 tablet, Rfl: 5 .  RA ASPIRIN EC 81 MG EC tablet, take 1 tablet by mouth once daily, Disp: 30 tablet, Rfl: 12 .  telmisartan-hydrochlorothiazide (MICARDIS  HCT) 80-25 MG tablet, Take 1 tablet by mouth daily., Disp: 30 tablet, Rfl: 5 .  zolpidem (AMBIEN) 5 MG tablet, Take by mouth., Disp: , Rfl:   Allergies  Allergen Reactions  . Ace Inhibitors Cough  . Codeine Hives and Itching     ROS  Constitutional: Negative for fever or weight change.  Respiratory: Positive  For mild  cough no  shortness of breath.   Cardiovascular: Negative for chest pain or palpitations.  Gastrointestinal: Negative for abdominal pain, no bowel changes.  Musculoskeletal: Negative for gait problem or joint swelling.  Skin: Negative for rash.  Neurological: Negative for dizziness or headache, except for mild facial pressure  No other specific complaints in a complete review of systems (except as listed in HPI above).  Objective  Vitals:   01/01/16 1420  BP: 124/84  Pulse: 74  Resp: 18  Temp: 98.3 F (36.8 C)  TempSrc: Oral  SpO2: 96%  Weight: 241 lb 6 oz (109.5 kg)    Body mass index is 30.99 kg/m.  Physical Exam  Constitutional: Patient appears well-developed and well-nourished. Obese  No distress.  HEENT: head atraumatic, normocephalic, pupils equal and reactive to light, ears normal TM, boggy turbinates, mild clear post-nasal drainage neck supple, throat within normal limits Cardiovascular: Normal rate, regular rhythm and normal heart sounds.  No murmur heard. No BLE edema. Pulmonary/Chest: Effort normal and breath sounds normal. No respiratory distress. Abdominal: Soft.  There is no tenderness. Psychiatric: Patient has a normal mood and affect. behavior is normal. Judgment and thought content normal.  PHQ2/9: Depression screen [facility] 2/9 01/01/2016 08/19/2015 05/02/2015 04/29/2015 01/04/2015  Decreased Interest 0 0 0 0 2  Down, Depressed, Hopeless 0 0 0 0 2  PHQ - 2 Score 0 0 0 0 4  Altered sleeping - - - - 0  Tired, decreased energy - - - - 2  Change in appetite - - - - 2  Feeling bad or failure about yourself  - - - - 2  Trouble concentrating - -  - - 0  Moving slowly or fidgety/restless - - - - 0  Suicidal thoughts - - - - 0  PHQ-9 Score - - - - 10  Difficult doing work/chores - - - - Somewhat difficult    Fall Risk: Fall Risk  01/01/2016 08/19/2015 05/02/2015 04/29/2015 01/04/2015  Falls in the past year? No No No No No      Functional Status Survey: Is the patient deaf or have difficulty hearing?: No Does the patient have difficulty seeing, even when wearing glasses/contacts?: Yes Does the patient have difficulty concentrating, remembering, or making decisions?: No Does the patient have difficulty walking or climbing stairs?: No Does the patient have difficulty dressing or bathing?: No Does the patient have difficulty doing errands alone such  as visiting a doctor's office or shopping?: No    Assessment & Plan  1. Benign essential HTN  - telmisartan-hydrochlorothiazide (MICARDIS HCT) 80-25 MG tablet; Take 1 tablet by mouth daily.  Dispense: 30 tablet; Refill: 5 - COMPLETE METABOLIC PANEL WITH GFR  2. Needs flu shot  - Flu Vaccine QUAD 36+ mos PF IM (Fluarix & Fluzone Quad PF)  3. Perennial allergic rhinitis with seasonal variation  - montelukast (SINGULAIR) 10 MG tablet; Take 1 tablet (10 mg total) by mouth at bedtime.  Dispense: 30 tablet; Refill: 5  4. GAD (generalized anxiety disorder)  - busPIRone (BUSPAR) 15 MG tablet; Take 1 tablet (15 mg total) by mouth 2 (two) times daily.  Dispense: 60 tablet; Refill: 5  5. Dyslipidemia  - atorvastatin (LIPITOR) 40 MG tablet; Take 1 tablet (40 mg total) by mouth daily.  Dispense: 30 tablet; Refill: 5 - Lipid panel  6. Dysmetabolic syndrome  - Hemoglobin A1c  7. Prostate cancer screening  - PSA  8. Engages in travel abroad  - amoxicillin-clavulanate (AUGMENTIN) 875-125 MG tablet; Take 1 tablet by mouth 2 (two) times daily.  Dispense: 20 tablet; Refill: 0  9. Viral sinusitis  - benzonatate (TESSALON) 100 MG capsule; Take 1-2 capsules (100-200 mg total) by mouth  2 (two) times daily as needed.  Dispense: 40 capsule; Refill: 0

## 2016-01-30 ENCOUNTER — Other Ambulatory Visit: Payer: Self-pay | Admitting: Family Medicine

## 2016-01-31 LAB — COMPREHENSIVE METABOLIC PANEL
A/G RATIO: 1.9 (ref 1.2–2.2)
ALK PHOS: 74 IU/L (ref 39–117)
ALT: 38 IU/L (ref 0–44)
AST: 20 IU/L (ref 0–40)
Albumin: 4.6 g/dL (ref 3.5–5.5)
BUN/Creatinine Ratio: 13 (ref 9–20)
BUN: 14 mg/dL (ref 6–24)
Bilirubin Total: 0.6 mg/dL (ref 0.0–1.2)
CO2: 27 mmol/L (ref 18–29)
Calcium: 9.5 mg/dL (ref 8.7–10.2)
Chloride: 96 mmol/L (ref 96–106)
Creatinine, Ser: 1.07 mg/dL (ref 0.76–1.27)
GFR calc Af Amer: 93 mL/min/{1.73_m2} (ref >59)
GFR calc non Af Amer: 81 mL/min/{1.73_m2} (ref >59)
GLOBULIN, TOTAL: 2.4 g/dL (ref 1.5–4.5)
Glucose: 102 mg/dL — ABNORMAL HIGH (ref 65–99)
POTASSIUM: 4.5 mmol/L (ref 3.5–5.2)
SODIUM: 138 mmol/L (ref 134–144)
Total Protein: 7 g/dL (ref 6.0–8.5)

## 2016-01-31 LAB — LIPID PANEL W/O CHOL/HDL RATIO
CHOLESTEROL TOTAL: 143 mg/dL (ref 100–199)
HDL: 30 mg/dL — ABNORMAL LOW (ref >39)
LDL Calculated: 80 mg/dL (ref 0–99)
TRIGLYCERIDES: 165 mg/dL — AB (ref 0–149)
VLDL Cholesterol Cal: 33 mg/dL (ref 5–40)

## 2016-01-31 LAB — PSA: Prostate Specific Ag, Serum: 4.1 ng/mL — ABNORMAL HIGH (ref 0.0–4.0)

## 2016-01-31 LAB — HGB A1C W/O EAG: HEMOGLOBIN A1C: 5.7 % — AB (ref 4.8–5.6)

## 2016-02-05 ENCOUNTER — Other Ambulatory Visit: Payer: Self-pay | Admitting: Family Medicine

## 2016-02-05 DIAGNOSIS — R972 Elevated prostate specific antigen [PSA]: Secondary | ICD-10-CM

## 2016-02-25 ENCOUNTER — Ambulatory Visit: Payer: 59

## 2016-03-04 ENCOUNTER — Ambulatory Visit: Payer: 59 | Admitting: Urology

## 2016-03-04 ENCOUNTER — Encounter: Payer: Self-pay | Admitting: Urology

## 2016-03-04 VITALS — BP 112/74 | HR 69 | Ht 74.0 in | Wt 235.0 lb

## 2016-03-04 DIAGNOSIS — R972 Elevated prostate specific antigen [PSA]: Secondary | ICD-10-CM | POA: Diagnosis not present

## 2016-03-04 DIAGNOSIS — N400 Enlarged prostate without lower urinary tract symptoms: Secondary | ICD-10-CM | POA: Diagnosis not present

## 2016-03-04 MED ORDER — TAMSULOSIN HCL 0.4 MG PO CAPS: 0.4000 mg | ORAL_CAPSULE | Freq: Every day | ORAL | 11 refills | Status: DC

## 2016-03-04 NOTE — Progress Notes (Signed)
03/04/2016 10:16 AM   Gavin Scott 1966-03-18 [id number]  Referring provider: Steele Sizer, MD [address] [place], [place] [address]  Chief Complaint  Patient presents with  . New Patient (Initial Visit)    elevated PSA    HPI: The patient is a 50 year old gentleman who presents today for an elevated PSA.  1. Elevated PSA  PSA was 4 in October 2017. It was 2.3 in September 2016.  He has no family history of prostate cancer. He underwent 2 prostate biopsies at the age of 31 for an abnormal digital rectal exam. His PSA was normal that time.  2. BPH The patient has nocturia 2-3. He also has urinary frequency during the day. He feels he empties his bladder has a good stream. He is very bothered by his nocturia.   PMH: Past Medical History:  Diagnosis Date  . Allergy   . Anxiety   . Basal cell carcinoma    Dr. Tyler Deis  . Circadian rhythm disorder   . Hyperlipidemia   . Hypertension   . Metabolic syndrome   . Obesity   . Perforation of left tympanic membrane     Surgical History: Past Surgical History:  Procedure Laterality Date  . PROSTATE BIOPSY     [facility]-Negative   . TOTAL SHOULDER REPLACEMENT Right    Outpatient     Home Medications:    Medication List       Accurate as of 03/04/16 10:16 AM. Always use your most recent med list.          ALLEGRA ALLERGY 180 MG tablet Generic drug:  fexofenadine Take by mouth.   amoxicillin-clavulanate 875-125 MG tablet Commonly known as:  AUGMENTIN Take 1 tablet by mouth 2 (two) times daily.   atorvastatin 40 MG tablet Commonly known as:  LIPITOR Take 1 tablet (40 mg total) by mouth daily.   busPIRone 15 MG tablet Commonly known as:  BUSPAR Take 1 tablet (15 mg total) by mouth 2 (two) times daily.   fluticasone 50 MCG/ACT nasal spray Commonly known as:  FLONASE Place into the nose.   montelukast 10 MG tablet Commonly known as:  SINGULAIR Take 1 tablet (10 mg total) by mouth at  bedtime.   RA ASPIRIN EC 81 MG EC tablet Generic drug:  aspirin take 1 tablet by mouth once daily   telmisartan-hydrochlorothiazide 80-25 MG tablet Commonly known as:  MICARDIS HCT Take 1 tablet by mouth daily.   zolpidem 5 MG tablet Commonly known as:  AMBIEN Take by mouth.       Allergies:  Allergies  Allergen Reactions  . Ace Inhibitors Cough  . Codeine Hives and Itching    Family History: Family History  Problem Relation Age of Onset  . Hyperlipidemia Mother   . Alcohol abuse Father   . Stroke Father   . Hyperlipidemia Father     Social History:  reports that he has never smoked. He has quit using smokeless tobacco. His smokeless tobacco use included Snuff and Chew. He reports that he drinks alcohol. He reports that he does not use drugs.  ROS: UROLOGY Frequent Urination?: Yes Hard to postpone urination?: No Burning/pain with urination?: No Get up at night to urinate?: Yes Leakage of urine?: No Urine stream starts and stops?: No Trouble starting stream?: No Do you have to strain to urinate?: No Blood in urine?: No Urinary tract infection?: No Sexually transmitted disease?: No Injury to kidneys or bladder?: No Painful intercourse?: No Weak stream?: No  Erection problems?: No Penile pain?: No  Gastrointestinal Nausea?: No Vomiting?: No Indigestion/heartburn?: No Diarrhea?: No Constipation?: No  Constitutional Fever: No Night sweats?: No Weight loss?: No Fatigue?: No  Skin Skin rash/lesions?: No Itching?: No  Eyes Blurred vision?: No Double vision?: No  Ears/Nose/Throat Sore throat?: No Sinus problems?: No  Hematologic/Lymphatic Swollen glands?: No Easy bruising?: No  Cardiovascular Leg swelling?: No Chest pain?: No  Respiratory Cough?: No Shortness of breath?: No  Endocrine Excessive thirst?: No  Musculoskeletal Back pain?: No Joint pain?: No  Neurological Headaches?: No Dizziness?: No  Psychologic Depression?:  No Anxiety?: Yes  Physical Exam: There were no vitals taken for this visit.  Constitutional:  Alert and oriented, No acute distress. HEENT: [place] AT, moist mucus membranes.  Trachea midline, no masses. Cardiovascular: No clubbing, cyanosis, or edema. Respiratory: Normal respiratory effort, no increased work of breathing. GI: Abdomen is soft, nontender, nondistended, no abdominal masses GU: No CVA tenderness. Normal phallus. Testicles are bilaterally. Benign. He does have a epididymal cyst on the right. Skin: No rashes, bruises or suspicious lesions. Lymph: No cervical or inguinal adenopathy. Neurologic: Grossly intact, no focal deficits, moving all 4 extremities. Psychiatric: Normal mood and affect.  Laboratory Data: No results found for: WBC, HGB, HCT, MCV, PLT  Lab Results  Component Value Date   CREATININE 1.07 01/30/2016    Lab Results  Component Value Date   PSA 2.3 12/26/2014   PSA 1.8 07/07/2013    No results found for: TESTOSTERONE  Lab Results  Component Value Date   HGBA1C 5.7 (H) 01/30/2016    Urinalysis No results found for: COLORURINE, APPEARANCEUR, LABSPEC, PHURINE, GLUCOSEU, HGBUR, BILIRUBINUR, KETONESUR, PROTEINUR, UROBILINOGEN, NITRITE, LEUKOCYTESUR   Assessment & Plan:    1. Elevated PSA I had a long discussion with the patient regarding the role of PSA testing and prostate cancer screening. We also discussed the natural history of prostate cancer. We discussed that the next step for some of elevated PSAs to confirm that this is a true elevation with a repeat PSA test. We also discussed with that we typically schedule prostate biopsy pending the results of this repeat PSA. He understands the risks, benefits, indications for prostate biopsy. He understands the risks include bleeding and infection. He understands that he will see blood in his urine, stool, and semen. The blood in his semen may persist for up to 6 weeks. He also understands the risk of  infection of about 1% would require inpatient hospitalization for IV antibiotics. All questions were answered. The patient has elected to proceed with prostate biopsy pending the results of his repeat PSA.  2. BPH We'll start on Flomax. The patient was warned of the risk of orthostatic hypotension and retrograde ejaculation.  Return for prostate biopsy.  Nickie Retort, MD  [facility] [address], [place], [place] [address] [phone]

## 2016-03-05 LAB — PSA TOTAL (REFLEX TO FREE): PROSTATE SPECIFIC AG, SERUM: 4.3 ng/mL — AB (ref 0.0–4.0)

## 2016-03-05 LAB — FPSA% REFLEX
% FREE PSA: 8.6 %
PSA, FREE: 0.37 ng/mL

## 2016-03-19 ENCOUNTER — Ambulatory Visit (INDEPENDENT_AMBULATORY_CARE_PROVIDER_SITE_OTHER): Payer: 59 | Admitting: Urology

## 2016-03-19 ENCOUNTER — Encounter: Payer: Self-pay | Admitting: Urology

## 2016-03-19 ENCOUNTER — Other Ambulatory Visit: Payer: Self-pay | Admitting: Urology

## 2016-03-19 VITALS — BP 124/85 | HR 78 | Ht 74.0 in | Wt 237.2 lb

## 2016-03-19 DIAGNOSIS — R972 Elevated prostate specific antigen [PSA]: Secondary | ICD-10-CM | POA: Diagnosis not present

## 2016-03-19 MED ORDER — GENTAMICIN SULFATE 40 MG/ML IJ SOLN
80.0000 mg | Freq: Once | INTRAMUSCULAR | Status: AC
  Administered 2016-03-19: 80 mg via INTRAMUSCULAR

## 2016-03-19 MED ORDER — LEVOFLOXACIN 500 MG PO TABS
500.0000 mg | ORAL_TABLET | Freq: Once | ORAL | Status: AC
  Administered 2016-03-19: 500 mg via ORAL

## 2016-03-19 NOTE — Progress Notes (Signed)
Prostate Biopsy Procedure   Informed consent was obtained after discussing risks/benefits of the procedure.  A time out was performed to ensure correct patient identity.  Pre-Procedure: - Last PSA Level: 4.3 and 4.1  - Gentamicin given prophylactically - Levaquin 500 mg administered PO -Transrectal Ultrasound performed revealing a 40.99 gm prostate -No significant hypoechoic or median lobe noted  Procedure: - Prostate block performed using 10 cc 1% lidocaine and biopsies taken from sextant areas, a total of 12 under ultrasound guidance.  Post-Procedure: - Patient tolerated the procedure well - He was counseled to seek immediate medical attention if experiences any severe pain, significant bleeding, or fevers - Return in one week to discuss biopsy results

## 2016-03-23 LAB — PATHOLOGY REPORT

## 2016-03-25 ENCOUNTER — Telehealth: Payer: Self-pay

## 2016-03-25 NOTE — Telephone Encounter (Signed)
The pt was notified. He wanted to know if he needed a f/u appt.? I told him he probably needed a f/u in 76mths with a PSA prior, but I would message you and contact him back.

## 2016-03-25 NOTE — Telephone Encounter (Signed)
Attempted to contact pt, per  Dr. Pilar Jarvis to inform him of his negative biopsy. Also to let him know he doesn't need to come in on Friday for his results.

## 2016-03-26 ENCOUNTER — Other Ambulatory Visit: Payer: Self-pay | Admitting: Urology

## 2016-03-27 ENCOUNTER — Ambulatory Visit: Payer: 59

## 2016-04-17 ENCOUNTER — Telehealth: Payer: Self-pay

## 2016-04-17 NOTE — Telephone Encounter (Signed)
I don't call in antibiotics, he needs to be seen or go to Urgent Care

## 2016-04-17 NOTE — Telephone Encounter (Signed)
Patient called and states his symptoms started Tuesday: Sinus Drainage, Low grade fever, chest congestion, post nasal drainage, off color mucus, and coughing. Patient states he gets these sinus infections twice a year and usually Dr. Ancil Boozer will call in Augmentin works well for patient. Can you please call in antibiotic due to not having any openings. Patient feels like it will be full blast this weekend without medication. Thanks.

## 2016-04-17 NOTE — Telephone Encounter (Signed)
Pt informed and would like to be on the cancellation list for Monday.

## 2016-05-14 ENCOUNTER — Encounter: Payer: Self-pay | Admitting: Family Medicine

## 2016-05-14 ENCOUNTER — Ambulatory Visit (INDEPENDENT_AMBULATORY_CARE_PROVIDER_SITE_OTHER): Payer: 59 | Admitting: Family Medicine

## 2016-05-14 DIAGNOSIS — R05 Cough: Secondary | ICD-10-CM | POA: Insufficient documentation

## 2016-05-14 DIAGNOSIS — J0100 Acute maxillary sinusitis, unspecified: Secondary | ICD-10-CM | POA: Diagnosis not present

## 2016-05-14 DIAGNOSIS — R058 Other specified cough: Secondary | ICD-10-CM

## 2016-05-14 MED ORDER — HYDROCOD POLST-CPM POLST ER 10-8 MG/5ML PO SUER: 5.0000 mL | Freq: Two times a day (BID) | ORAL | 0 refills | Status: DC | PRN

## 2016-05-14 MED ORDER — AZITHROMYCIN 250 MG PO TABS: ORAL_TABLET | ORAL | 0 refills | Status: DC

## 2016-05-14 NOTE — Progress Notes (Signed)
Name: Gavin Scott   MRN: [id number]    DOB: Aug 28, 1965   Date:05/14/2016       Progress Note  Subjective  Chief Complaint  Chief Complaint  Patient presents with  . Acute Visit    possible sinus infection   . Fever    low grade fever this morning with a temp of 100.1, pt has taken tylenol and fever broke     Sinusitis  This is a new problem. The current episode started more than 1 month ago (started with a cough, then post nasal drainage, now having thick greenish mucus out of the nose). The problem is unchanged. There has been no fever (elevated temperature of 100.58F this morning). The pain is moderate. Associated symptoms include congestion, coughing, headaches, sinus pressure and a sore throat. Past treatments include oral decongestants (Claritin D). The treatment provided mild relief.    Past Medical History:  Diagnosis Date  . Allergy   . Anxiety   . Basal cell carcinoma    Dr. Tyler Deis  . Circadian rhythm disorder   . Hyperlipidemia   . Hypertension   . Metabolic syndrome   . Obesity   . Perforation of left tympanic membrane     Past Surgical History:  Procedure Laterality Date  . PROSTATE BIOPSY     [facility]-Negative   . TOTAL SHOULDER REPLACEMENT Right    Outpatient     Family History  Problem Relation Age of Onset  . Hyperlipidemia Mother   . Alcohol abuse Father   . Stroke Father   . Hyperlipidemia Father     Social History   Social History  . Marital status: Married    Spouse name: N/A  . Number of children: N/A  . Years of education: N/A   Occupational History  . Not on file.   Social History Main Topics  . Smoking status: Never Smoker  . Smokeless tobacco: Former Systems developer    Types: Snuff, Chew  . Alcohol use 0.0 oz/week     Comment: occasionally  . Drug use: No  . Sexual activity: Yes    Partners: Female   Other Topics Concern  . Not on file   Social History Narrative  . No narrative on file     Current Outpatient  Prescriptions:  .  atorvastatin (LIPITOR) 40 MG tablet, Take 1 tablet (40 mg total) by mouth daily., Disp: 30 tablet, Rfl: 5 .  busPIRone (BUSPAR) 15 MG tablet, Take 1 tablet (15 mg total) by mouth 2 (two) times daily., Disp: 60 tablet, Rfl: 5 .  fexofenadine (ALLEGRA ALLERGY) 180 MG tablet, Take by mouth., Disp: , Rfl:  .  fluticasone (FLONASE) 50 MCG/ACT nasal spray, Place into the nose., Disp: , Rfl:  .  montelukast (SINGULAIR) 10 MG tablet, Take 1 tablet (10 mg total) by mouth at bedtime., Disp: 30 tablet, Rfl: 5 .  RA ASPIRIN EC 81 MG EC tablet, take 1 tablet by mouth once daily, Disp: 30 tablet, Rfl: 12 .  tamsulosin (FLOMAX) 0.4 MG CAPS capsule, Take 1 capsule (0.4 mg total) by mouth daily., Disp: 30 capsule, Rfl: 11 .  telmisartan-hydrochlorothiazide (MICARDIS HCT) 80-25 MG tablet, Take 1 tablet by mouth daily., Disp: 30 tablet, Rfl: 5 .  zolpidem (AMBIEN) 5 MG tablet, Take by mouth., Disp: , Rfl:  .  amoxicillin-clavulanate (AUGMENTIN) 875-125 MG tablet, Take 1 tablet by mouth 2 (two) times daily. (Patient not taking: Reported on 03/19/2016), Disp: 20 tablet, Rfl: 0  Allergies  Allergen Reactions  .  Ace Inhibitors Cough  . Codeine Hives and Itching     Review of Systems  HENT: Positive for congestion, sinus pressure and sore throat.   Respiratory: Positive for cough.   Neurological: Positive for headaches.    Objective  Vitals:   05/14/16 1441  BP: 126/68  Pulse: (!) 106  Resp: 17  Temp: 98.4 F (36.9 C)  TempSrc: Oral  SpO2: 97%  Weight: 242 lb 6.4 oz (110 kg)  Height: 6\' 2"  (1.88 m)    Physical Exam  Constitutional: He is well-developed, well-nourished, and in no distress.  HENT:  Head: Normocephalic and atraumatic.  Right Ear: Tympanic membrane and ear canal normal. No drainage, swelling or tenderness.  Left Ear: Tympanic membrane and ear canal normal. No drainage, swelling or tenderness.  Nose: Right sinus exhibits maxillary sinus tenderness. Left sinus  exhibits maxillary sinus tenderness.  Mouth/Throat: Posterior oropharyngeal erythema present. No oropharyngeal exudate.  Nasal turbinates hypertrophied, inflamed,  Cardiovascular: Normal rate, regular rhythm, S1 normal, S2 normal and normal heart sounds.   No murmur heard. Pulmonary/Chest: Effort normal and breath sounds normal. He has no wheezes.  Nursing note and vitals reviewed.    Assessment & Plan  1. Acute non-recurrent maxillary sinusitis  - azithromycin (ZITHROMAX) 250 MG tablet; 2 tabs po day 1, then 1 tab po q day x 4 days  Dispense: 6 each; Refill: 0  2. Productive cough  - chlorpheniramine-HYDROcodone (TUSSIONEX PENNKINETIC ER) 10-8 MG/5ML SUER; Take 5 mLs by mouth every 12 (twelve) hours as needed for cough.  Dispense: 140 mL; Refill: 0   Clemmie Buelna [facility] 05/14/2016 2:58 PM

## 2016-05-15 ENCOUNTER — Other Ambulatory Visit: Payer: Self-pay | Admitting: Family Medicine

## 2016-05-15 DIAGNOSIS — R05 Cough: Secondary | ICD-10-CM

## 2016-05-15 DIAGNOSIS — R058 Other specified cough: Secondary | ICD-10-CM

## 2016-05-15 MED ORDER — BENZONATATE 200 MG PO CAPS: 200.0000 mg | ORAL_CAPSULE | Freq: Two times a day (BID) | ORAL | 0 refills | Status: DC | PRN

## 2016-07-14 ENCOUNTER — Other Ambulatory Visit: Payer: Self-pay | Admitting: Family Medicine

## 2016-07-14 DIAGNOSIS — R972 Elevated prostate specific antigen [PSA]: Secondary | ICD-10-CM

## 2016-07-14 DIAGNOSIS — F411 Generalized anxiety disorder: Secondary | ICD-10-CM

## 2016-07-14 NOTE — Telephone Encounter (Signed)
Refills have been sent.  

## 2016-07-14 NOTE — Telephone Encounter (Signed)
Pt has not seen Sowles since 12/2015 he must make an appointment and we can send enough to last until appointment but nothing more

## 2016-07-14 NOTE — Telephone Encounter (Signed)
Pt verbally informed and thanks you

## 2016-07-14 NOTE — Telephone Encounter (Signed)
He has scheduled an appointment with Dr Ancil Boozer for 08-12-16

## 2016-07-14 NOTE — Telephone Encounter (Signed)
Pt has appointment with Dr Manuella Ghazi on this coming Monday for sinus infection (due to Dr Ancil Boozer being completely booked). He is going out of the country (on Tuesday) and is asking for a refill on flomax and buspar. Asking that you [facility] [phone]

## 2016-07-17 ENCOUNTER — Other Ambulatory Visit: Payer: Self-pay | Admitting: Family Medicine

## 2016-07-17 DIAGNOSIS — F411 Generalized anxiety disorder: Secondary | ICD-10-CM

## 2016-07-20 ENCOUNTER — Ambulatory Visit: Payer: 59 | Admitting: Family Medicine

## 2016-07-20 NOTE — Telephone Encounter (Signed)
Patient requesting refill of Buspar to [facility].

## 2016-07-22 NOTE — Telephone Encounter (Signed)
Please close

## 2016-07-23 MED ORDER — BUSPIRONE HCL 15 MG PO TABS: 15.0000 mg | ORAL_TABLET | Freq: Two times a day (BID) | ORAL | 0 refills | Status: DC

## 2016-08-08 ENCOUNTER — Other Ambulatory Visit: Payer: Self-pay | Admitting: Family Medicine

## 2016-08-08 DIAGNOSIS — E785 Hyperlipidemia, unspecified: Secondary | ICD-10-CM

## 2016-08-08 DIAGNOSIS — I10 Essential (primary) hypertension: Secondary | ICD-10-CM

## 2016-08-12 ENCOUNTER — Ambulatory Visit (INDEPENDENT_AMBULATORY_CARE_PROVIDER_SITE_OTHER): Payer: 59 | Admitting: Family Medicine

## 2016-08-12 ENCOUNTER — Encounter: Payer: Self-pay | Admitting: Family Medicine

## 2016-08-12 VITALS — BP 116/78 | HR 87 | Temp 98.2°F | Resp 16 | Ht 74.0 in | Wt 229.1 lb

## 2016-08-12 DIAGNOSIS — R61 Generalized hyperhidrosis: Secondary | ICD-10-CM | POA: Diagnosis not present

## 2016-08-12 DIAGNOSIS — J302 Other seasonal allergic rhinitis: Secondary | ICD-10-CM

## 2016-08-12 DIAGNOSIS — F411 Generalized anxiety disorder: Secondary | ICD-10-CM

## 2016-08-12 DIAGNOSIS — E785 Hyperlipidemia, unspecified: Secondary | ICD-10-CM | POA: Diagnosis not present

## 2016-08-12 DIAGNOSIS — J3089 Other allergic rhinitis: Secondary | ICD-10-CM | POA: Diagnosis not present

## 2016-08-12 DIAGNOSIS — I10 Essential (primary) hypertension: Secondary | ICD-10-CM

## 2016-08-12 DIAGNOSIS — Z1211 Encounter for screening for malignant neoplasm of colon: Secondary | ICD-10-CM | POA: Diagnosis not present

## 2016-08-12 MED ORDER — MONTELUKAST SODIUM 10 MG PO TABS: 10.0000 mg | ORAL_TABLET | Freq: Every day | ORAL | 5 refills | Status: DC

## 2016-08-12 MED ORDER — ASPIRIN 81 MG PO TBEC: 81.0000 mg | DELAYED_RELEASE_TABLET | Freq: Every day | ORAL | 12 refills | Status: DC

## 2016-08-12 MED ORDER — ATORVASTATIN CALCIUM 40 MG PO TABS: 40.0000 mg | ORAL_TABLET | Freq: Every day | ORAL | 5 refills | Status: DC

## 2016-08-12 MED ORDER — BUSPIRONE HCL 15 MG PO TABS: 15.0000 mg | ORAL_TABLET | Freq: Two times a day (BID) | ORAL | 5 refills | Status: DC

## 2016-08-12 MED ORDER — TELMISARTAN-HCTZ 80-12.5 MG PO TABS: 1.0000 | ORAL_TABLET | Freq: Every day | ORAL | 5 refills | Status: DC

## 2016-08-12 NOTE — Progress Notes (Signed)
Name: Gavin Scott   MRN: [id number]    DOB: 01/04/1966   Date:08/12/2016       Progress Note  Subjective  Chief Complaint  Chief Complaint  Patient presents with  . Medication Refill  . Hypertension  . Hyperlipidemia  . Depression  . Anxiety    HPI  HTN: taking medication, denies chest pain or palpitation. , bp is towards to low end of normal, since he has worsening of nocturia we will decrease dose of HCTZ from 25 to 12.5 mg  Hyperlipidemia: LDL was at goal, taking Lipitor. Last HDL was 30, but he has increase tree nuts and more fruit and vegetables on his diet  Obesity: he lost 16 lbs during recent trip to [place] by eating healthier, explained now BMI is below 30 and it would be great if he can keep it there.   Insomnia: only takes medication prn , mostly when he travels, but has been taking Melatonin   Depression/GAD: he is doing much better, his step-parents and father are all in a nursing home. Marland Kitchen He states depression has resolved, doing well on Buspirone. He states worrying is under control,  No problems sleeping and appetite is good  Nocturia: seen by Urologist and on Flomax , but still getting up 3 times per night, he will contact them again  Diaphoresis: he states he had a few episodes over the past 5 years, most recent happened 3 weeks ago while on a business trip to [place]. He states he woke up with difficulty catching his breath, had breakfast and within 30 minutes broke out in a sweat, felt dizzy, some chest tightness, no SOB or nausea. Symptoms resolved within 30 minutes and felt well after that. He did not seek medication attention. Explained it may have been a panic attack, hypoglycemia , but because of risk factors we can refer him to Cardiologist for possible stress test. He denies calf pain or SOB since.     Patient Active Problem List   Diagnosis Date Noted  . Acute non-recurrent maxillary sinusitis 05/14/2016  . GERD (gastroesophageal reflux disease) 08/19/2015   . Depression 01/04/2015  . Generalized anxiety disorder 12/18/2014  . Basal cell carcinoma 12/18/2014  . Benign essential HTN 12/18/2014  . Circadian rhythm disorder 12/18/2014  . History of prostate biopsy 12/18/2014  . Dyslipidemia 12/18/2014  . Blood glucose elevated 12/18/2014  . Obesity (BMI 30.0-34.9) 12/18/2014  . Perennial allergic rhinitis with seasonal variation 12/18/2014  . Ear drum perforation 12/18/2014  . Motion sickness 12/18/2014  . Engages in travel abroad 12/18/2014  . Dysmetabolic syndrome 71/09/2692    Past Surgical History:  Procedure Laterality Date  . PROSTATE BIOPSY     [facility]-Negative   . TOTAL SHOULDER REPLACEMENT Right    Outpatient     Family History  Problem Relation Age of Onset  . Hyperlipidemia Mother   . Alcohol abuse Father   . Stroke Father   . Hyperlipidemia Father     Social History   Social History  . Marital status: Married    Spouse name: N/A  . Number of children: N/A  . Years of education: N/A   Occupational History  . Not on file.   Social History Main Topics  . Smoking status: Never Smoker  . Smokeless tobacco: Former Systems developer    Types: Snuff, Chew  . Alcohol use 0.0 oz/week     Comment: occasionally  . Drug use: No  . Sexual activity: Yes    Partners: Female  Other Topics Concern  . Not on file   Social History Narrative  . No narrative on file     Current Outpatient Prescriptions:  .  aspirin (RA ASPIRIN EC) 81 MG EC tablet, Take 1 tablet (81 mg total) by mouth daily. Swallow whole., Disp: 30 tablet, Rfl: 12 .  atorvastatin (LIPITOR) 40 MG tablet, Take 1 tablet (40 mg total) by mouth daily., Disp: 30 tablet, Rfl: 5 .  azelastine (ASTELIN) 0.1 % nasal spray, , Disp: , Rfl: 0 .  busPIRone (BUSPAR) 15 MG tablet, Take 1 tablet (15 mg total) by mouth 2 (two) times daily., Disp: 60 tablet, Rfl: 5 .  fexofenadine (ALLEGRA ALLERGY) 180 MG tablet, Take by mouth., Disp: , Rfl:  .  fluticasone (FLONASE) 50  MCG/ACT nasal spray, Place into the nose., Disp: , Rfl:  .  montelukast (SINGULAIR) 10 MG tablet, Take 1 tablet (10 mg total) by mouth at bedtime., Disp: 30 tablet, Rfl: 5 .  tamsulosin (FLOMAX) 0.4 MG CAPS capsule, Take 1 capsule (0.4 mg total) by mouth daily., Disp: 30 capsule, Rfl: 11 .  telmisartan-hydrochlorothiazide (MICARDIS HCT) 80-12.5 MG tablet, Take 1 tablet by mouth daily., Disp: 30 tablet, Rfl: 5  Allergies  Allergen Reactions  . Ace Inhibitors Cough  . Codeine Hives and Itching     ROS  Constitutional: Negative for fever or weight change.  Respiratory: Negative for cough and shortness of breath.   Cardiovascular: Negative for chest pain or palpitations.  Gastrointestinal: Negative for abdominal pain, no bowel changes.  Musculoskeletal: Negative for gait problem or joint swelling.  Skin: Negative for rash.  Neurological: Negative for dizziness or headache.  No other specific complaints in a complete review of systems (except as listed in HPI above).   Objective  Vitals:   08/12/16 1146  BP: 116/78  Pulse: 87  Resp: 16  Temp: 98.2 F (36.8 C)  SpO2: 97%  Weight: 229 lb 2 oz (103.9 kg)  Height: 6\' 2"  (1.88 m)    Body mass index is 29.42 kg/m.  Physical Exam  Constitutional: Patient appears well-developed and well-nourished. Overweight. No distress.  HEENT: head atraumatic, normocephalic, pupils equal and reactive to light, neck supple, throat within normal limits Cardiovascular: Normal rate, regular rhythm and normal heart sounds.  No murmur heard. No BLE edema. Pulmonary/Chest: Effort normal and breath sounds normal. No respiratory distress. Abdominal: Soft.  There is no tenderness. Psychiatric: Patient has a normal mood and affect. behavior is normal. Judgment and thought content normal.  PHQ2/9: Depression screen [facility] 2/9 05/14/2016 01/01/2016 08/19/2015 05/02/2015 04/29/2015  Decreased Interest 0 0 0 0 0  Down, Depressed, Hopeless 0 0 0 0 0  PHQ - 2 Score  0 0 0 0 0  Altered sleeping - - - - -  Tired, decreased energy - - - - -  Change in appetite - - - - -  Feeling bad or failure about yourself  - - - - -  Trouble concentrating - - - - -  Moving slowly or fidgety/restless - - - - -  Suicidal thoughts - - - - -  PHQ-9 Score - - - - -  Difficult doing work/chores - - - - -     Fall Risk: Fall Risk  05/14/2016 01/01/2016 08/19/2015 05/02/2015 04/29/2015  Falls in the past year? No No No No No    GAD 7 : Generalized Anxiety Score 08/12/2016  Nervous, Anxious, on Edge 1  Control/stop worrying 1  Worry too much -  different things 3  Trouble relaxing 0  Restless 0  Easily annoyed or irritable 1  Afraid - awful might happen 1  Total GAD 7 Score 7  Anxiety Difficulty Somewhat difficult     Assessment & Plan  1. Dyslipidemia  - aspirin (RA ASPIRIN EC) 81 MG EC tablet; Take 1 tablet (81 mg total) by mouth daily. Swallow whole.  Dispense: 30 tablet; Refill: 12 - atorvastatin (LIPITOR) 40 MG tablet; Take 1 tablet (40 mg total) by mouth daily.  Dispense: 30 tablet; Refill: 5  2. Perennial allergic rhinitis with seasonal variation  - montelukast (SINGULAIR) 10 MG tablet; Take 1 tablet (10 mg total) by mouth at bedtime.  Dispense: 30 tablet; Refill: 5  3. GAD (generalized anxiety disorder)  Under more stress because trying to sale his father's house - busPIRone (BUSPAR) 15 MG tablet; Take 1 tablet (15 mg total) by mouth 2 (two) times daily.  Dispense: 60 tablet; Refill: 5  4. Benign essential HTN  - aspirin (RA ASPIRIN EC) 81 MG EC tablet; Take 1 tablet (81 mg total) by mouth daily. Swallow whole.  Dispense: 30 tablet; Refill: 12 - telmisartan-hydrochlorothiazide (MICARDIS HCT) 80-12.5 MG tablet; Take 1 tablet by mouth daily.  Dispense: 30 tablet; Refill: 5   5. Diaphoresis  He wants to hold off on referral at this time, feeling well, no decrease in exercise tolerance  6. Colon cancer screening  - Ambulatory referral to  Gastroenterology

## 2016-08-13 ENCOUNTER — Telehealth: Payer: Self-pay | Admitting: Gastroenterology

## 2016-08-13 ENCOUNTER — Telehealth: Payer: Self-pay

## 2016-08-13 ENCOUNTER — Other Ambulatory Visit: Payer: Self-pay

## 2016-08-13 DIAGNOSIS — Z1211 Encounter for screening for malignant neoplasm of colon: Secondary | ICD-10-CM

## 2016-08-13 NOTE — Telephone Encounter (Signed)
Gastroenterology Pre-Procedure Review  Request Date: 09/08/16 Tue Requesting Physician: Dr. Allen Norris  PATIENT REVIEW QUESTIONS: The patient responded to the following health history questions as indicated:    1. Are you having any GI issues? no 2. Do you have a personal history of Polyps? no 3. Do you have a family history of Colon Cancer or Polyps? no 4. Diabetes Mellitus? no 5. Joint replacements in the past 12 months?no 6. Major health problems in the past 3 months?no 7. Any artificial heart valves, MVP, or defibrillator?no    MEDICATIONS & ALLERGIES:    Patient reports the following regarding taking any anticoagulation/antiplatelet therapy:   Plavix, Coumadin, Eliquis, Xarelto, Lovenox, Pradaxa, Brilinta, or Effient? no Aspirin? no  Patient confirms/reports the following medications:  Current Outpatient Prescriptions  Medication Sig Dispense Refill  . aspirin (RA ASPIRIN EC) 81 MG EC tablet Take 1 tablet (81 mg total) by mouth daily. Swallow whole. 30 tablet 12  . atorvastatin (LIPITOR) 40 MG tablet Take 1 tablet (40 mg total) by mouth daily. 30 tablet 5  . azelastine (ASTELIN) 0.1 % nasal spray   0  . busPIRone (BUSPAR) 15 MG tablet Take 1 tablet (15 mg total) by mouth 2 (two) times daily. 60 tablet 5  . fexofenadine (ALLEGRA ALLERGY) 180 MG tablet Take by mouth.    . fluticasone (FLONASE) 50 MCG/ACT nasal spray Place into the nose.    . montelukast (SINGULAIR) 10 MG tablet Take 1 tablet (10 mg total) by mouth at bedtime. 30 tablet 5  . tamsulosin (FLOMAX) 0.4 MG CAPS capsule Take 1 capsule (0.4 mg total) by mouth daily. 30 capsule 11  . telmisartan-hydrochlorothiazide (MICARDIS HCT) 80-12.5 MG tablet Take 1 tablet by mouth daily. 30 tablet 5   No current facility-administered medications for this visit.     Patient confirms/reports the following allergies:  Allergies  Allergen Reactions  . Ace Inhibitors Cough  . Codeine Hives and Itching    No orders of the defined types  were placed in this encounter.   AUTHORIZATION INFORMATION Primary Insurance: 1D#: Group #:  Secondary Insurance: 1D#: Group #:  SCHEDULE INFORMATION: Date:  Time: Location:

## 2016-08-13 NOTE — Telephone Encounter (Signed)
08/13/16 [employer] website Authorization Ref #: [id number] for Screening Colonoscopy 35825 / Z12.11

## 2016-08-24 ENCOUNTER — Telehealth: Payer: Self-pay | Admitting: Gastroenterology

## 2016-08-24 NOTE — Telephone Encounter (Signed)
Patient left a voice message that he needs to reschedule his procedure. Please call

## 2016-08-25 NOTE — Telephone Encounter (Signed)
Pt has requested reschedule colonoscopy from May 29h to 09/29/16 due to his wifes schedule.  Mark in Endo has moved appt as requested.

## 2016-09-29 ENCOUNTER — Ambulatory Visit
Admission: RE | Admit: 2016-09-29 | Discharge: 2016-09-29 | Disposition: A | Discharge status: Home / Self Care | Payer: 59 | Source: Ambulatory Visit | Attending: Gastroenterology | Admitting: Gastroenterology

## 2016-09-29 ENCOUNTER — Ambulatory Visit: Payer: 59 | Admitting: Registered Nurse

## 2016-09-29 ENCOUNTER — Encounter
Admission: RE | Disposition: A | Discharge status: Home / Self Care | Payer: Self-pay | Source: Ambulatory Visit | Attending: Gastroenterology

## 2016-09-29 DIAGNOSIS — I10 Essential (primary) hypertension: Secondary | ICD-10-CM | POA: Diagnosis not present

## 2016-09-29 DIAGNOSIS — F419 Anxiety disorder, unspecified: Secondary | ICD-10-CM | POA: Insufficient documentation

## 2016-09-29 DIAGNOSIS — E669 Obesity, unspecified: Secondary | ICD-10-CM | POA: Insufficient documentation

## 2016-09-29 DIAGNOSIS — K621 Rectal polyp: Secondary | ICD-10-CM | POA: Diagnosis not present

## 2016-09-29 DIAGNOSIS — Z7951 Long term (current) use of inhaled steroids: Secondary | ICD-10-CM | POA: Diagnosis not present

## 2016-09-29 DIAGNOSIS — Z96611 Presence of right artificial shoulder joint: Secondary | ICD-10-CM | POA: Insufficient documentation

## 2016-09-29 DIAGNOSIS — Z6829 Body mass index (BMI) 29.0-29.9, adult: Secondary | ICD-10-CM | POA: Diagnosis not present

## 2016-09-29 DIAGNOSIS — D122 Benign neoplasm of ascending colon: Secondary | ICD-10-CM | POA: Diagnosis not present

## 2016-09-29 DIAGNOSIS — Z85828 Personal history of other malignant neoplasm of skin: Secondary | ICD-10-CM | POA: Diagnosis not present

## 2016-09-29 DIAGNOSIS — D128 Benign neoplasm of rectum: Secondary | ICD-10-CM | POA: Diagnosis not present

## 2016-09-29 DIAGNOSIS — K635 Polyp of colon: Secondary | ICD-10-CM | POA: Diagnosis not present

## 2016-09-29 DIAGNOSIS — Z7982 Long term (current) use of aspirin: Secondary | ICD-10-CM | POA: Diagnosis not present

## 2016-09-29 DIAGNOSIS — Z1211 Encounter for screening for malignant neoplasm of colon: Secondary | ICD-10-CM | POA: Insufficient documentation

## 2016-09-29 DIAGNOSIS — E785 Hyperlipidemia, unspecified: Secondary | ICD-10-CM | POA: Insufficient documentation

## 2016-09-29 DIAGNOSIS — K640 First degree hemorrhoids: Secondary | ICD-10-CM | POA: Diagnosis not present

## 2016-09-29 HISTORY — PX: COLONOSCOPY WITH PROPOFOL: SHX5780

## 2016-09-29 SURGERY — COLONOSCOPY WITH PROPOFOL
Anesthesia: General

## 2016-09-29 MED ORDER — PROPOFOL 10 MG/ML IV BOLUS
INTRAVENOUS | Status: DC | PRN
  Administered 2016-09-29: 70 mg via INTRAVENOUS

## 2016-09-29 MED ORDER — MIDAZOLAM HCL 2 MG/2ML IJ SOLN
INTRAMUSCULAR | Status: DC | PRN
  Administered 2016-09-29: 1 mg via INTRAVENOUS

## 2016-09-29 MED ORDER — PROPOFOL 500 MG/50ML IV EMUL
INTRAVENOUS | Status: DC | PRN
  Administered 2016-09-29: 160 ug/kg/min via INTRAVENOUS

## 2016-09-29 MED ORDER — PROPOFOL 500 MG/50ML IV EMUL
INTRAVENOUS | Status: AC
  Filled 2016-09-29: qty 50

## 2016-09-29 MED ORDER — MIDAZOLAM HCL 2 MG/2ML IJ SOLN
INTRAMUSCULAR | Status: AC
  Filled 2016-09-29: qty 2

## 2016-09-29 MED ORDER — SODIUM CHLORIDE 0.9 % IV SOLN
INTRAVENOUS | Status: DC
  Administered 2016-09-29: 08:00:00 via INTRAVENOUS

## 2016-09-29 NOTE — Transfer of Care (Signed)
Immediate Anesthesia Transfer of Care Note  Patient: Gavin Scott  Procedure(s) Performed: Procedure(s): COLONOSCOPY WITH PROPOFOL (N/A)  Patient Location: PACU  Anesthesia Type:General  Level of Consciousness: awake, alert  and oriented  Airway & Oxygen Therapy: Patient Spontanous Breathing and Patient connected to nasal cannula oxygen  Post-op Assessment: Report given to RN and Post -op Vital signs reviewed and stable  Post vital signs: Reviewed and stable  Last Vitals:  Vitals:   09/29/16 0803 09/29/16 0907  BP: (!) 146/85 91/64  Pulse: 66 67  Resp: 16 14  Temp: 36.4 C (!) 35.8 C    Last Pain:  Vitals:   09/29/16 0907  TempSrc: Tympanic         Complications: No apparent anesthesia complications

## 2016-09-29 NOTE — Anesthesia Preprocedure Evaluation (Signed)
Anesthesia Evaluation  Patient identified by MRN, date of birth, ID band Patient awake    Reviewed: Allergy & Precautions, H&P , NPO status , Patient's Chart, lab work & pertinent test results, reviewed documented beta blocker date and time   Airway Mallampati: III  TM Distance: >3 FB Neck ROM: full    Dental  (+) Caps, Dental Advidsory Given, Teeth Intact   Pulmonary neg pulmonary ROS,           Cardiovascular Exercise Tolerance: Good hypertension, (-) angina(-) CAD, (-) Past MI, (-) Cardiac Stents and (-) CABG (-) dysrhythmias (-) Valvular Problems/Murmurs     Neuro/Psych PSYCHIATRIC DISORDERS (Anxiety and depression) negative neurological ROS     GI/Hepatic Neg liver ROS, GERD  ,  Endo/Other  negative endocrine ROS  Renal/GU negative Renal ROS  negative genitourinary   Musculoskeletal   Abdominal   Peds  Hematology negative hematology ROS (+)   Anesthesia Other Findings Past Medical History: No date: Allergy No date: Anxiety No date: Basal cell carcinoma     Comment: Dr. Tyler Deis No date: Circadian rhythm disorder No date: Hyperlipidemia No date: Hypertension No date: Metabolic syndrome No date: Obesity No date: Perforation of left tympanic membrane   Reproductive/Obstetrics negative OB ROS                             Anesthesia Physical Anesthesia Plan  ASA: II  Anesthesia Plan: General   Post-op Pain Management:    Induction:   PONV Risk Score and Plan: 2 and Propofol  Airway Management Planned: Natural Airway  Additional Equipment:   Intra-op Plan:   Post-operative Plan:   Informed Consent: I have reviewed the patients History and Physical, chart, labs and discussed the procedure including the risks, benefits and alternatives for the proposed anesthesia with the patient or authorized representative who has indicated his/her understanding and acceptance.   Dental  Advisory Given  Plan Discussed with: Anesthesiologist, CRNA and Surgeon  Anesthesia Plan Comments:         Anesthesia Quick Evaluation

## 2016-09-29 NOTE — Anesthesia Post-op Follow-up Note (Cosign Needed)
Anesthesia QCDR form completed.        

## 2016-09-29 NOTE — Op Note (Addendum)
[facility] Gastroenterology Patient Name: Gavin Scott Procedure Date: 09/29/2016 8:41 AM MRN: [id number] Account #: [id number] Date of Birth: 06-24-1965 Admit Type: Outpatient Age: 51 Room: [facility] ENDO ROOM 4 Gender: Male Note Status: Finalized Procedure:            Colonoscopy Indications:          Screening for colorectal malignant neoplasm Providers:            [name] MD, MD Referring MD:         Bethena Roys. Sowles, MD (Referring MD) Medicines:            Propofol per Anesthesia Complications:        No immediate complications. Procedure:            Pre-Anesthesia Assessment:                       - Prior to the procedure, a History and Physical was                        performed, and patient medications and allergies were                        reviewed. The patient's tolerance of previous                        anesthesia was also reviewed. The risks and benefits of                        the procedure and the sedation options and risks were                        discussed with the patient. All questions were                        answered, and informed consent was obtained. Prior                        Anticoagulants: The patient has taken no previous                        anticoagulant or antiplatelet agents. ASA Grade                        Assessment: II - A patient with mild systemic disease.                        After reviewing the risks and benefits, the patient was                        deemed in satisfactory condition to undergo the                        procedure.                       After obtaining informed consent, the colonoscope was                        passed under direct vision. Throughout the procedure,  the patient's blood pressure, pulse, and oxygen                        saturations were monitored continuously. The                        Colonoscope was introduced through the anus and        advanced to the the cecum, identified by appendiceal                        orifice and ileocecal valve. The colonoscopy was                        performed without difficulty. The patient tolerated the                        procedure well. The quality of the bowel preparation                        was excellent. Findings:      The perianal and digital rectal examinations were normal.      A 2 mm polyp was found in the rectum. The polyp was sessile. The polyp       was removed with a cold biopsy forceps. Resection and retrieval were       complete.      Two sessile polyps were found in the ascending colon. The polyps were 2       to 3 mm in size. These polyps were removed with a cold biopsy forceps.       Resection and retrieval were complete.      Non-bleeding internal hemorrhoids were found during retroflexion. The       hemorrhoids were Grade I (internal hemorrhoids that do not prolapse). Impression:           - One 2 mm polyp in the rectum, removed with a cold                        biopsy forceps. Resected and retrieved.                       - Two 2 to 3 mm polyps in the ascending colon, removed                        with a cold biopsy forceps. Resected and retrieved.                       - Non-bleeding internal hemorrhoids. Recommendation:       - Discharge patient to home.                       - Resume previous diet.                       - Continue present medications.                       - Await pathology results.                       - Repeat colonoscopy in 5 years if polyp adenoma and 10  years if hyperplastic Procedure Code(s):    --- Professional ---                       [phone], Colonoscopy, flexible; with biopsy, single or                        multiple Diagnosis Code(s):    --- Professional ---                       Z12.11, Encounter for screening for malignant neoplasm                        of colon                       K62.1,  Rectal polyp                       D12.2, Benign neoplasm of ascending colon CPT copyright 2016 [employer]. All rights reserved. The codes documented in this report are preliminary and upon coder review may  be revised to meet current compliance requirements. [name] MD, MD 09/29/2016 9:05:37 AM This report has been signed electronically. Number of Addenda: 0 Note Initiated On: 09/29/2016 8:41 AM Scope Withdrawal Time: 0 hours 7 minutes 29 seconds  Total Procedure Duration: 0 hours 11 minutes 1 second       [facility]

## 2016-09-29 NOTE — Anesthesia Postprocedure Evaluation (Signed)
Anesthesia Post Note  Patient: Gavin Scott  Procedure(s) Performed: Procedure(s) (LRB): COLONOSCOPY WITH PROPOFOL (N/A)  Patient location during evaluation: Endoscopy Anesthesia Type: General Level of consciousness: awake and alert Pain management: pain level controlled Vital Signs Assessment: post-procedure vital signs reviewed and stable Respiratory status: spontaneous breathing, nonlabored ventilation, respiratory function stable and patient connected to nasal cannula oxygen Cardiovascular status: blood pressure returned to baseline and stable Postop Assessment: no signs of nausea or vomiting Anesthetic complications: no     Last Vitals:  Vitals:   09/29/16 0907 09/29/16 0930  BP: 91/64 125/86  Pulse: 67 62  Resp: 14 (!) 21  Temp: (!) 35.8 C     Last Pain:  Vitals:   09/29/16 0907  TempSrc: Tympanic                 Martha Clan

## 2016-09-29 NOTE — H&P (Signed)
Gavin Lame, MD [address] [place],  [address] Phone: [phone] Fax : [phone]  Primary Care Physician:  Steele Sizer, MD Primary Gastroenterologist:  Dr. Allen Norris  Pre-Procedure History & Physical: HPI:  Gavin Scott is a 51 y.o. male is here for a screening colonoscopy.   Past Medical History:  Diagnosis Date  . Allergy   . Anxiety   . Basal cell carcinoma    Dr. Tyler Deis  . Circadian rhythm disorder   . Hyperlipidemia   . Hypertension   . Metabolic syndrome   . Obesity   . Perforation of left tympanic membrane     Past Surgical History:  Procedure Laterality Date  . PROSTATE BIOPSY     [facility]-Negative   . TOTAL SHOULDER REPLACEMENT Right    Outpatient     Prior to Admission medications   Medication Sig Start Date End Date Taking? Authorizing Provider  azelastine (ASTELIN) 0.1 % nasal spray  06/21/16  Yes [provider]  busPIRone (BUSPAR) 15 MG tablet Take 1 tablet (15 mg total) by mouth 2 (two) times daily. 08/12/16  Yes Sowles, Drue Stager, MD  fexofenadine [facility] ALLERGY) 180 MG tablet Take by mouth.   Yes [provider]  fluticasone (FLONASE) 50 MCG/ACT nasal spray Place into the nose. 07/16/14  Yes [provider]  tamsulosin (FLOMAX) 0.4 MG CAPS capsule Take 1 capsule (0.4 mg total) by mouth daily. 03/04/16  Yes Nickie Retort, MD  telmisartan-hydrochlorothiazide (MICARDIS HCT) 80-12.5 MG tablet Take 1 tablet by mouth daily. 08/12/16  Yes Sowles, Drue Stager, MD  aspirin (RA ASPIRIN EC) 81 MG EC tablet Take 1 tablet (81 mg total) by mouth daily. Swallow whole. 08/12/16   Steele Sizer, MD  atorvastatin (LIPITOR) 40 MG tablet Take 1 tablet (40 mg total) by mouth daily. 08/12/16   Steele Sizer, MD  montelukast (SINGULAIR) 10 MG tablet Take 1 tablet (10 mg total) by mouth at bedtime. 08/12/16   Steele Sizer, MD    Allergies as of 08/13/2016 - Review Complete 08/12/2016  Allergen Reaction Noted  . Ace  inhibitors Cough 11/07/2014  . Codeine Hives and Itching 01/04/2015    Family History  Problem Relation Age of Onset  . Hyperlipidemia Mother   . Alcohol abuse Father   . Stroke Father   . Hyperlipidemia Father     Social History   Social History  . Marital status: Married    Spouse name: N/A  . Number of children: N/A  . Years of education: N/A   Occupational History  . Not on file.   Social History Main Topics  . Smoking status: Never Smoker  . Smokeless tobacco: Former Systems developer    Types: Snuff, Chew  . Alcohol use 0.0 oz/week     Comment: occasionally  . Drug use: No  . Sexual activity: Yes    Partners: Female   Other Topics Concern  . Not on file   Social History Narrative  . No narrative on file    Review of Systems: See HPI, otherwise negative ROS  Physical Exam: BP (!) 146/85   Pulse 66   Temp 97.5 F (36.4 C) (Tympanic)   Resp 16   Ht 6\' 1"  (1.854 m)   Wt 225 lb (102.1 kg)   SpO2 97%   BMI 29.69 kg/m  General:   Alert,  pleasant and cooperative in NAD Head:  Normocephalic and atraumatic. Neck:  Supple; no masses or thyromegaly. Lungs:  Clear throughout to auscultation.  Heart:  Regular rate and rhythm. Abdomen:  Soft, nontender and nondistended. Normal bowel sounds, without guarding, and without rebound.   Neurologic:  Alert and  oriented x4;  grossly normal neurologically.  Impression/Plan: Gavin Scott is now here to undergo a screening colonoscopy.  Risks, benefits, and alternatives regarding colonoscopy have been reviewed with the patient.  Questions have been answered.  All parties agreeable.

## 2016-09-30 ENCOUNTER — Encounter: Payer: Self-pay | Admitting: Gastroenterology

## 2016-09-30 LAB — SURGICAL PATHOLOGY

## 2017-01-09 DIAGNOSIS — Z23 Encounter for immunization: Secondary | ICD-10-CM | POA: Diagnosis not present

## 2017-03-04 ENCOUNTER — Other Ambulatory Visit: Payer: Self-pay | Admitting: Family Medicine

## 2017-03-04 DIAGNOSIS — E785 Hyperlipidemia, unspecified: Secondary | ICD-10-CM

## 2017-03-04 DIAGNOSIS — I10 Essential (primary) hypertension: Secondary | ICD-10-CM

## 2017-03-05 ENCOUNTER — Other Ambulatory Visit: Payer: Self-pay | Admitting: Family Medicine

## 2017-03-05 NOTE — Progress Notes (Unsigned)
Please schedule a follow up, sent a 30 day supply

## 2017-03-29 ENCOUNTER — Encounter: Payer: Self-pay | Admitting: Family Medicine

## 2017-03-29 ENCOUNTER — Ambulatory Visit: Payer: 59 | Admitting: Family Medicine

## 2017-03-29 VITALS — BP 110/70 | HR 80 | Resp 14 | Ht 73.0 in | Wt 224.7 lb

## 2017-03-29 DIAGNOSIS — R739 Hyperglycemia, unspecified: Secondary | ICD-10-CM | POA: Diagnosis not present

## 2017-03-29 DIAGNOSIS — R2991 Unspecified symptoms and signs involving the musculoskeletal system: Secondary | ICD-10-CM | POA: Diagnosis not present

## 2017-03-29 DIAGNOSIS — G4720 Circadian rhythm sleep disorder, unspecified type: Secondary | ICD-10-CM | POA: Diagnosis not present

## 2017-03-29 DIAGNOSIS — E785 Hyperlipidemia, unspecified: Secondary | ICD-10-CM

## 2017-03-29 DIAGNOSIS — J3089 Other allergic rhinitis: Secondary | ICD-10-CM

## 2017-03-29 DIAGNOSIS — F411 Generalized anxiety disorder: Secondary | ICD-10-CM | POA: Diagnosis not present

## 2017-03-29 DIAGNOSIS — J302 Other seasonal allergic rhinitis: Secondary | ICD-10-CM

## 2017-03-29 DIAGNOSIS — I10 Essential (primary) hypertension: Secondary | ICD-10-CM | POA: Diagnosis not present

## 2017-03-29 MED ORDER — BUSPIRONE HCL 15 MG PO TABS: 15.0000 mg | ORAL_TABLET | Freq: Two times a day (BID) | ORAL | 5 refills | Status: DC

## 2017-03-29 MED ORDER — ATORVASTATIN CALCIUM 40 MG PO TABS: 40.0000 mg | ORAL_TABLET | Freq: Every day | ORAL | 5 refills | Status: DC

## 2017-03-29 MED ORDER — ZOLPIDEM TARTRATE 10 MG PO TABS: 10.0000 mg | ORAL_TABLET | Freq: Every evening | ORAL | 0 refills | Status: DC | PRN

## 2017-03-29 MED ORDER — AZELASTINE HCL 0.1 % NA SOLN: 1.0000 | Freq: Two times a day (BID) | NASAL | 5 refills | Status: DC

## 2017-03-29 MED ORDER — MONTELUKAST SODIUM 10 MG PO TABS: 10.0000 mg | ORAL_TABLET | Freq: Every day | ORAL | 5 refills | Status: DC

## 2017-03-29 MED ORDER — TELMISARTAN 80 MG PO TABS: 80.0000 mg | ORAL_TABLET | Freq: Every day | ORAL | 5 refills | Status: DC

## 2017-03-29 NOTE — Progress Notes (Signed)
Name: Gavin Scott   MRN: [id number]    DOB: [birth year]   Date:03/29/2017       Progress Note  Subjective  Chief Complaint  Chief Complaint  Patient presents with  . Hypertension  . Hyperlipidemia  . Obesity  . Depression    HPI  HTN: taking medication, denies chest pain or palpitation. , bp is towards to low end of normal, we will stop HCTZ.   Hyperlipidemia: LDL was at goal, taking Lipitor. Last HDL was 30, but he has increase tree nuts and more fruit and vegetables on his diet.  Obesity: he had lost 16 lbs during a trip to [place] April 2018 but since than he has maintained his weight, not eating once he feels full. He is feeling well, losing inches.   Insomnia: only takes medication Ambien prn , mostly when he travels, but has been taking Melatonin   Depression/GAD: he is doing much better, his step-parents and father are all in a nursing home. Marland Kitchen He states depression has resolved, doing well on Buspirone. He states worrying is under control, No problems sleeping and appetite is good.   Nocturia: seen by Urologist and was taking Flomax but did not work, we will stop hctz. Prostate biopsies negative dec 2017   Hyperglycemia: he denies polyphagia, polydipsia or polyuria.   Joint swelling: noticed over the past 6 months that dIP joints are swollen , red and sometimes painful, no stiffness, he uses hands a lot of work  Patient Active Problem List   Diagnosis Date Noted  . Colon cancer screening   . Rectal polyp   . Benign neoplasm of ascending colon   . Acute non-recurrent maxillary sinusitis 05/14/2016  . GERD (gastroesophageal reflux disease) 08/19/2015  . Depression 01/04/2015  . Generalized anxiety disorder 12/18/2014  . Basal cell carcinoma 12/18/2014  . Benign essential HTN 12/18/2014  . Circadian rhythm disorder 12/18/2014  . History of prostate biopsy 12/18/2014  . Dyslipidemia 12/18/2014  . Blood glucose elevated 12/18/2014  . Obesity (BMI 30.0-34.9)  12/18/2014  . Perennial allergic rhinitis with seasonal variation 12/18/2014  . Ear drum perforation 12/18/2014  . Motion sickness 12/18/2014  . Engages in travel abroad 12/18/2014  . Dysmetabolic syndrome 75/64/3329    Past Surgical History:  Procedure Laterality Date  . COLONOSCOPY WITH PROPOFOL N/A 09/29/2016   Procedure: COLONOSCOPY WITH PROPOFOL;  Surgeon: [name], MD;  Location: [facility] ENDOSCOPY;  Service: Endoscopy;  Laterality: N/A;  . PROSTATE BIOPSY     [facility]-Negative   . TOTAL SHOULDER REPLACEMENT Right    Outpatient     Family History  Problem Relation Age of Onset  . Hyperlipidemia Mother   . Alcohol abuse Father   . Stroke Father   . Hyperlipidemia Father     Social History   Socioeconomic History  . Marital status: Married    Spouse name: Not on file  . Number of children: Not on file  . Years of education: Not on file  . Highest education level: Not on file  Social Needs  . Financial resource strain: Not on file  . Food insecurity - worry: Not on file  . Food insecurity - inability: Not on file  . Transportation needs - medical: Not on file  . Transportation needs - non-medical: Not on file  Occupational History  . Not on file  Tobacco Use  . Smoking status: Never Smoker  . Smokeless tobacco: Former Systems developer    Types: Snuff, Chew  Substance and Sexual Activity  .  Alcohol use: Yes    Alcohol/week: 0.0 oz    Comment: occasionally  . Drug use: No  . Sexual activity: Yes    Partners: Female  Other Topics Concern  . Not on file  Social History Narrative  . Not on file     Current Outpatient Medications:  .  aspirin (RA ASPIRIN EC) 81 MG EC tablet, Take 1 tablet (81 mg total) by mouth daily. Swallow whole., Disp: 30 tablet, Rfl: 12 .  atorvastatin (LIPITOR) 40 MG tablet, Take 1 tablet (40 mg total) by mouth daily., Disp: 30 tablet, Rfl: 5 .  azelastine (ASTELIN) 0.1 % nasal spray, Place 1 spray into both nostrils 2 (two) times daily.,  Disp: 30 mL, Rfl: 5 .  busPIRone (BUSPAR) 15 MG tablet, Take 1 tablet (15 mg total) by mouth 2 (two) times daily., Disp: 60 tablet, Rfl: 5 .  fexofenadine (ALLEGRA ALLERGY) 180 MG tablet, Take by mouth., Disp: , Rfl:  .  montelukast (SINGULAIR) 10 MG tablet, Take 1 tablet (10 mg total) by mouth at bedtime., Disp: 30 tablet, Rfl: 5 .  telmisartan (MICARDIS) 80 MG tablet, Take 1 tablet (80 mg total) by mouth daily., Disp: 30 tablet, Rfl: 5 .  zolpidem (AMBIEN) 10 MG tablet, Take 1 tablet (10 mg total) by mouth at bedtime as needed for sleep., Disp: 30 tablet, Rfl: 0  Allergies  Allergen Reactions  . Ace Inhibitors Cough  . Codeine Hives and Itching     ROS  Constitutional: Negative for fever or weight change.  Respiratory: Negative for cough and shortness of breath.   Cardiovascular: Negative for chest pain or palpitations.  Gastrointestinal: Negative for abdominal pain, no bowel changes.  Musculoskeletal: Negative for gait problem or joint swelling.  Skin: Negative for rash.  Neurological: Negative for dizziness or headache.  No other specific complaints in a complete review of systems (except as listed in HPI above).  Objective  Vitals:   03/29/17 1114  BP: 110/70  Pulse: 80  Resp: 14  SpO2: 97%  Weight: 224 lb 11.2 oz (101.9 kg)  Height: 6\' 1"  (1.854 m)    Body mass index is 29.65 kg/m.  Physical Exam  Constitutional: Patient appears well-developed and well-nourished. Obese No distress.  HEENT: head atraumatic, normocephalic, pupils equal and reactive to light,neck supple, throat within normal limits Cardiovascular: Normal rate, regular rhythm and normal heart sounds.  No murmur heard. No BLE edema. Pulmonary/Chest: Effort normal and breath sounds normal. No respiratory distress. Abdominal: Soft.  There is no tenderness. Psychiatric: Patient has a normal mood and affect. behavior is normal. Judgment and thought content normal. Muscular Skeletal: thick distal  interphalangeal joints some erythema, no increase in warmth, no change in function   PHQ2/9: Depression screen [facility] 2/9 05/14/2016 01/01/2016 08/19/2015 05/02/2015 04/29/2015  Decreased Interest 0 0 0 0 0  Down, Depressed, Hopeless 0 0 0 0 0  PHQ - 2 Score 0 0 0 0 0  Altered sleeping - - - - -  Tired, decreased energy - - - - -  Change in appetite - - - - -  Feeling bad or failure about yourself  - - - - -  Trouble concentrating - - - - -  Moving slowly or fidgety/restless - - - - -  Suicidal thoughts - - - - -  PHQ-9 Score - - - - -  Difficult doing work/chores - - - - -     Fall Risk: Fall Risk  03/29/2017 05/14/2016 01/01/2016 08/19/2015  05/02/2015  Falls in the past year? No No No No No      Functional Status Survey: Is the patient deaf or have difficulty hearing?: No Does the patient have difficulty seeing, even when wearing glasses/contacts?: No Does the patient have difficulty concentrating, remembering, or making decisions?: No Does the patient have difficulty walking or climbing stairs?: No Does the patient have difficulty dressing or bathing?: No Does the patient have difficulty doing errands alone such as visiting a doctor's office or shopping?: No    Assessment & Plan  1. Dyslipidemia  - atorvastatin (LIPITOR) 40 MG tablet; Take 1 tablet (40 mg total) by mouth daily.  Dispense: 30 tablet; Refill: 5 - EKG 12-Lead - Lipid panel  2. GAD (generalized anxiety disorder)  - busPIRone (BUSPAR) 15 MG tablet; Take 1 tablet (15 mg total) by mouth 2 (two) times daily.  Dispense: 60 tablet; Refill: 5  3. Perennial allergic rhinitis with seasonal variation  - montelukast (SINGULAIR) 10 MG tablet; Take 1 tablet (10 mg total) by mouth at bedtime.  Dispense: 30 tablet; Refill: 5  4. Hypertension, benign  Stop HCTZ, monitor bp at home - telmisartan (MICARDIS) 80 MG tablet; Take 1 tablet (80 mg total) by mouth daily.  Dispense: 30 tablet; Refill: 5 - EKG 12-Lead - normal  - CBC  with Differential/Platelet - COMPLETE METABOLIC PANEL WITH GFR  5. Circadian rhythm sleep disorder  - zolpidem (AMBIEN) 10 MG tablet; Take 1 tablet (10 mg total) by mouth at bedtime as needed for sleep.  Dispense: 30 tablet; Refill: 0  6. Hyperglycemia  - Hemoglobin A1c  7. Joint symptoms of hand  - Rheumatoid Factor - Sedimentation rate - C-reactive protein

## 2017-04-08 LAB — COMPLETE METABOLIC PANEL WITH GFR
AG Ratio: 2.3 (calc) (ref 1.0–2.5)
ALKALINE PHOSPHATASE (APISO): 65 U/L (ref 40–115)
ALT: 37 U/L (ref 9–46)
AST: 23 U/L (ref 10–35)
Albumin: 4.6 g/dL (ref 3.6–5.1)
BUN: 14 mg/dL (ref 7–25)
CALCIUM: 9.7 mg/dL (ref 8.6–10.3)
CO2: 30 mmol/L (ref 20–32)
CREATININE: 0.87 mg/dL (ref 0.70–1.33)
Chloride: 102 mmol/L (ref 98–110)
GFR, EST NON AFRICAN AMERICAN: 100 mL/min/{1.73_m2} (ref >=60)
GFR, Est African American: 116 mL/min/{1.73_m2} (ref >=60)
GLUCOSE: 99 mg/dL (ref 65–99)
Globulin: 2 g/dL (calc) (ref 1.9–3.7)
Potassium: 4.9 mmol/L (ref 3.5–5.3)
Sodium: 139 mmol/L (ref 135–146)
Total Bilirubin: 0.7 mg/dL (ref 0.2–1.2)
Total Protein: 6.6 g/dL (ref 6.1–8.1)

## 2017-04-08 LAB — CBC WITH DIFFERENTIAL/PLATELET
BASOS ABS: 31 {cells}/uL (ref 0–200)
Basophils Relative: 0.5 %
EOS PCT: 1.5 %
Eosinophils Absolute: 92 cells/uL (ref 15–500)
HCT: 47.2 % (ref 38.5–50.0)
Hemoglobin: 16 g/dL (ref 13.2–17.1)
LYMPHS ABS: 2092 {cells}/uL (ref 850–3900)
MCH: 29 pg (ref 27.0–33.0)
MCHC: 33.9 g/dL (ref 32.0–36.0)
MCV: 85.5 fL (ref 80.0–100.0)
MONOS PCT: 8.3 %
MPV: 10.6 fL (ref 7.5–12.5)
Neutro Abs: 3379 cells/uL (ref 1500–7800)
Neutrophils Relative %: 55.4 %
Platelets: 251 10*3/uL (ref 140–400)
RBC: 5.52 10*6/uL (ref 4.20–5.80)
RDW: 12.5 % (ref 11.0–15.0)
Total Lymphocyte: 34.3 %
WBC mixed population: 506 cells/uL (ref 200–950)
WBC: 6.1 10*3/uL (ref 3.8–10.8)

## 2017-04-08 LAB — C-REACTIVE PROTEIN: CRP: 2 mg/L (ref <8.0)

## 2017-04-08 LAB — SEDIMENTATION RATE: Sed Rate: 2 mm/h (ref 0–20)

## 2017-04-08 LAB — LIPID PANEL
CHOL/HDL RATIO: 3.8 (calc) (ref <5.0)
CHOLESTEROL: 127 mg/dL (ref <200)
HDL: 33 mg/dL — AB (ref >40)
LDL CHOLESTEROL (CALC): 69 mg/dL
Non-HDL Cholesterol (Calc): 94 mg/dL (calc) (ref <130)
TRIGLYCERIDES: 173 mg/dL — AB (ref <150)

## 2017-04-08 LAB — HEMOGLOBIN A1C
EAG (MMOL/L): 6.2 (calc)
Hgb A1c MFr Bld: 5.5 % of total Hgb (ref <5.7)
MEAN PLASMA GLUCOSE: 111 (calc)

## 2017-04-08 LAB — RHEUMATOID FACTOR: Rhuematoid fact SerPl-aCnc: <14 IU/mL (ref <14)

## 2017-08-03 DIAGNOSIS — Z1283 Encounter for screening for malignant neoplasm of skin: Secondary | ICD-10-CM | POA: Diagnosis not present

## 2017-08-03 DIAGNOSIS — L570 Actinic keratosis: Secondary | ICD-10-CM | POA: Diagnosis not present

## 2017-08-03 DIAGNOSIS — D225 Melanocytic nevi of trunk: Secondary | ICD-10-CM | POA: Diagnosis not present

## 2017-08-03 DIAGNOSIS — Z85828 Personal history of other malignant neoplasm of skin: Secondary | ICD-10-CM | POA: Diagnosis not present

## 2017-09-13 DIAGNOSIS — J0191 Acute recurrent sinusitis, unspecified: Secondary | ICD-10-CM | POA: Diagnosis not present

## 2017-09-13 DIAGNOSIS — R05 Cough: Secondary | ICD-10-CM | POA: Diagnosis not present

## 2017-09-14 ENCOUNTER — Ambulatory Visit: Payer: 59 | Admitting: Nurse Practitioner

## 2017-09-17 DIAGNOSIS — R05 Cough: Secondary | ICD-10-CM | POA: Diagnosis not present

## 2017-09-17 DIAGNOSIS — J40 Bronchitis, not specified as acute or chronic: Secondary | ICD-10-CM | POA: Diagnosis not present

## 2017-09-21 ENCOUNTER — Other Ambulatory Visit: Payer: Self-pay | Admitting: Family Medicine

## 2017-09-21 DIAGNOSIS — F411 Generalized anxiety disorder: Secondary | ICD-10-CM

## 2017-09-22 NOTE — Telephone Encounter (Signed)
Spoke with patient and he is not able to come in this Friday due to his daughter graduating. He will be in town for the rest of the month. Would like to know where else I can work him in because you are completely booked. He did state that he will be completely out of this medication by tomorrow. Please advise

## 2017-09-22 NOTE — Telephone Encounter (Signed)
Left voice message on [phone] @ 4:38 informing pt that a 30 day supply of his prescription has been sent to his pharmacy and that he will have to be seen in order to receive any more refills. If Dr Ancil Boozer is completely booked she did give the okay to put him in on a Thursday at 1pm to see her.

## 2017-09-22 NOTE — Telephone Encounter (Signed)
Must be seen Friday

## 2017-09-28 ENCOUNTER — Ambulatory Visit: Payer: 59 | Admitting: Family Medicine

## 2017-09-28 ENCOUNTER — Encounter: Payer: Self-pay | Admitting: Family Medicine

## 2017-09-28 ENCOUNTER — Other Ambulatory Visit: Payer: Self-pay | Admitting: Family Medicine

## 2017-09-28 VITALS — BP 110/90 | HR 53 | Resp 16 | Ht 73.0 in | Wt 220.7 lb

## 2017-09-28 DIAGNOSIS — I10 Essential (primary) hypertension: Secondary | ICD-10-CM | POA: Diagnosis not present

## 2017-09-28 DIAGNOSIS — F411 Generalized anxiety disorder: Secondary | ICD-10-CM | POA: Diagnosis not present

## 2017-09-28 DIAGNOSIS — G4720 Circadian rhythm sleep disorder, unspecified type: Secondary | ICD-10-CM

## 2017-09-28 DIAGNOSIS — J3089 Other allergic rhinitis: Secondary | ICD-10-CM | POA: Diagnosis not present

## 2017-09-28 DIAGNOSIS — J209 Acute bronchitis, unspecified: Secondary | ICD-10-CM | POA: Diagnosis not present

## 2017-09-28 DIAGNOSIS — E785 Hyperlipidemia, unspecified: Secondary | ICD-10-CM

## 2017-09-28 DIAGNOSIS — J302 Other seasonal allergic rhinitis: Secondary | ICD-10-CM | POA: Diagnosis not present

## 2017-09-28 MED ORDER — BUSPIRONE HCL 15 MG PO TABS: 15.0000 mg | ORAL_TABLET | Freq: Two times a day (BID) | ORAL | 1 refills | Status: DC

## 2017-09-28 MED ORDER — ZOLPIDEM TARTRATE 10 MG PO TABS: 10.0000 mg | ORAL_TABLET | Freq: Every evening | ORAL | 0 refills | Status: DC | PRN

## 2017-09-28 MED ORDER — ATORVASTATIN CALCIUM 40 MG PO TABS: 40.0000 mg | ORAL_TABLET | Freq: Every day | ORAL | 5 refills | Status: DC

## 2017-09-28 MED ORDER — MONTELUKAST SODIUM 10 MG PO TABS: 10.0000 mg | ORAL_TABLET | Freq: Every day | ORAL | 5 refills | Status: DC

## 2017-09-28 MED ORDER — TELMISARTAN 80 MG PO TABS: 80.0000 mg | ORAL_TABLET | Freq: Every day | ORAL | 5 refills | Status: DC

## 2017-09-28 MED ORDER — BENZONATATE 100 MG PO CAPS: 100.0000 mg | ORAL_CAPSULE | Freq: Three times a day (TID) | ORAL | 0 refills | Status: DC | PRN

## 2017-09-28 NOTE — Progress Notes (Signed)
Name: Gavin Scott   MRN: [id number]    DOB: 09/13/1965   Date:09/28/2017       Progress Note  Subjective  Chief Complaint  Chief Complaint  Patient presents with  . Hypertension  . Depression  . Anxiety  . Cough    He has had sinusitis and bronchits in the past. He reports that his symptoms have resolved but he has drainage that causes him to cough. He needs something to help with cough.    HPI   HTN: taking medication, denies chest pain or palpitation. , DBP today is slightly up at 90. Usually well controlled at home, and is tolerating micardis 80 without HcTZ we will continue to monitor.   Hyperlipidemia: LDL was at goal, taking Lipitor.Last HDL was 33, but he has  increased fish, tree nuts and more fruit and vegetables on his diet. Recheck next visit.   Overweight: he had lost 16 lbs during a trip to [place] April 2018 but since than he has maintained his weight, not eating once he feels full. He is feeling well, lhe is down 4 lbs in the past 6 months and feels good about it. He is no longer obese, just overweight.   Insomnia: only takes medication Ambien prn , mostly when he travels, but has been taking Melatonin. Needs a refill.   Depression/GAD: he has been doing well, his step-parents and father are all in a nursing home, now having to place mother in a nursing home also ( they all live in [place]), but coping well, feeling more tired and problems sleeping for the past two weeks because of recent sinusitis and bronchitis, but otherwise feels good.  Marland Kitchen He states depression has resolved, doing well on Buspirone. He states worrying is under control.  Nocturia:seen by Urologist and was taking Flomax but did not work, he is off HCTZ and he states nocturia improved since. Prostate biopsies negative dec 2017   Hyperglycemia: he denies polyphagia, polydipsia or polyuria.  Last HbgA1C improved, continue life style modification  Joint swelling: doing much better now, usually  triggered by activity. Tumeric has helped   Cough: treated with Zpack and prednisone and also cough suppressant. He is still has a cough, and severe post-nasal drainage  He took some mucinex but not currently , we will give him some samples.    Patient Active Problem List   Diagnosis Date Noted  . Colon cancer screening   . Rectal polyp   . Benign neoplasm of ascending colon   . Acute non-recurrent maxillary sinusitis 05/14/2016  . GERD (gastroesophageal reflux disease) 08/19/2015  . Depression 01/04/2015  . Generalized anxiety disorder 12/18/2014  . Basal cell carcinoma 12/18/2014  . Benign essential HTN 12/18/2014  . Circadian rhythm disorder 12/18/2014  . History of prostate biopsy 12/18/2014  . Dyslipidemia 12/18/2014  . Blood glucose elevated 12/18/2014  . Obesity (BMI 30.0-34.9) 12/18/2014  . Perennial allergic rhinitis with seasonal variation 12/18/2014  . Ear drum perforation 12/18/2014  . Motion sickness 12/18/2014  . Engages in travel abroad 12/18/2014  . Dysmetabolic syndrome 81/19/1478    Past Surgical History:  Procedure Laterality Date  . COLONOSCOPY WITH PROPOFOL N/A 09/29/2016   Procedure: COLONOSCOPY WITH PROPOFOL;  Surgeon: [name], MD;  Location: [facility] ENDOSCOPY;  Service: Endoscopy;  Laterality: N/A;  . PROSTATE BIOPSY     [facility]-Negative   . TOTAL SHOULDER REPLACEMENT Right    Outpatient     Family History  Problem Relation Age of Onset  .  Hyperlipidemia Mother   . Alcohol abuse Father   . Stroke Father   . Hyperlipidemia Father     Social History   Socioeconomic History  . Marital status: Married    Spouse name: Not on file  . Number of children: 2  . Years of education: Not on file  . Highest education level: Bachelor's degree (e.g., BA, AB, BS)  Occupational History  . Occupation: Customer service manager   Social Needs  . Financial resource strain: Not on file  . Food insecurity:    Worry: Not on file    Inability: Not  on file  . Transportation needs:    Medical: Not on file    Non-medical: Not on file  Tobacco Use  . Smoking status: Never Smoker  . Smokeless tobacco: Former Systems developer    Types: Snuff, Chew  Substance and Sexual Activity  . Alcohol use: Yes    Alcohol/week: 0.6 oz    Types: 1 Cans of beer per week    Comment: occasionally  . Drug use: No  . Sexual activity: Yes    Partners: Female  Lifestyle  . Physical activity:    Days per week: Not on file    Minutes per session: Not on file  . Stress: Not on file  Relationships  . Social connections:    Talks on phone: Not on file    Gets together: Not on file    Attends religious service: Not on file    Active member of club or organization: Not on file    Attends meetings of clubs or organizations: Not on file    Relationship status: Not on file  . Intimate partner violence:    Fear of current or ex partner: Not on file    Emotionally abused: Not on file    Physically abused: Not on file    Forced sexual activity: Not on file  Other Topics Concern  . Not on file  Social History Narrative  . Not on file     Current Outpatient Medications:  .  aspirin (RA ASPIRIN EC) 81 MG EC tablet, Take 1 tablet (81 mg total) by mouth daily. Swallow whole., Disp: 30 tablet, Rfl: 12 .  atorvastatin (LIPITOR) 40 MG tablet, Take 1 tablet (40 mg total) by mouth daily., Disp: 30 tablet, Rfl: 5 .  azelastine (ASTELIN) 0.1 % nasal spray, Place 1 spray into both nostrils 2 (two) times daily., Disp: 30 mL, Rfl: 5 .  busPIRone (BUSPAR) 15 MG tablet, Take 1 tablet (15 mg total) by mouth 2 (two) times daily., Disp: 60 tablet, Rfl: 1 .  fexofenadine (ALLEGRA ALLERGY) 180 MG tablet, Take by mouth., Disp: , Rfl:  .  montelukast (SINGULAIR) 10 MG tablet, Take 1 tablet (10 mg total) by mouth at bedtime., Disp: 30 tablet, Rfl: 5 .  telmisartan (MICARDIS) 80 MG tablet, Take 1 tablet (80 mg total) by mouth daily., Disp: 30 tablet, Rfl: 5 .  albuterol (PROVENTIL  HFA;VENTOLIN HFA) 108 (90 Base) MCG/ACT inhaler, Inhale 2 puffs into the lungs daily., Disp: , Rfl: 1 .  Ascorbic Acid (VITAMIN C) 1000 MG tablet, Take 1 tablet by mouth., Disp: , Rfl:  .  benzonatate (TESSALON) 100 MG capsule, Take 1-2 capsules (100-200 mg total) by mouth 3 (three) times daily as needed., Disp: 40 capsule, Rfl: 0 .  Omega-3 Fatty Acids (RA FISH OIL) 1000 MG CAPS, Take 1 capsule by mouth 2 (two) times daily., Disp: , Rfl:  .  zolpidem (AMBIEN)  10 MG tablet, Take 1 tablet (10 mg total) by mouth at bedtime as needed for sleep., Disp: 30 tablet, Rfl: 0  Allergies  Allergen Reactions  . Ace Inhibitors Cough  . Codeine Hives and Itching     ROS  Constitutional: Negative for fever or weight change.  Respiratory:positive for cough but no current  shortness of breath.   Cardiovascular: Negative for chest pain or palpitations.  Gastrointestinal: Negative for abdominal pain, no bowel changes.  Musculoskeletal: Negative for gait problem or joint swelling.  Skin: Negative for rash.  Neurological: Negative for dizziness or headache.  No other specific complaints in a complete review of systems (except as listed in HPI above).  Objective  Vitals:   09/28/17 1054  BP: 110/90  Pulse: (!) 53  Resp: 16  SpO2: 97%  Weight: 220 lb 11.2 oz (100.1 kg)  Height: 6\' 1"  (1.854 m)    Body mass index is 29.12 kg/m.  Physical Exam  Constitutional: Patient appears well-developed and well-nourished. Overweight.  No distress.  HEENT: head atraumatic, normocephalic, pupils equal and reactive to light, neck supple, throat within normal limits Cardiovascular: Normal rate, regular rhythm and normal heart sounds.  No murmur heard. No BLE edema. Pulmonary/Chest: Effort normal and breath sounds normal. No respiratory distress. Abdominal: Soft.  There is no tenderness. Psychiatric: Patient has a normal mood and affect. behavior is normal. Judgment and thought content  normal.  PHQ2/9: Depression screen [facility] 2/9 09/28/2017 05/14/2016 01/01/2016 08/19/2015 05/02/2015  Decreased Interest 0 0 0 0 0  Down, Depressed, Hopeless 1 0 0 0 0  PHQ - 2 Score 1 0 0 0 0  Altered sleeping 3 - - - -  Tired, decreased energy 2 - - - -  Change in appetite 0 - - - -  Feeling bad or failure about yourself  0 - - - -  Trouble concentrating 0 - - - -  Moving slowly or fidgety/restless 0 - - - -  Suicidal thoughts 0 - - - -  PHQ-9 Score 6 - - - -  Difficult doing work/chores Somewhat difficult - - - -   He has been sick over the past 2 weeks   GAD 7 : Generalized Anxiety Score 09/28/2017 08/12/2016  Nervous, Anxious, on Edge 0 1  Control/stop worrying 1 1  Worry too much - different things 1 3  Trouble relaxing 2 0  Restless 1 0  Easily annoyed or irritable 2 1  Afraid - awful might happen 1 1  Total GAD 7 Score 8 7  Anxiety Difficulty Somewhat difficult Somewhat difficult     Fall Risk: Fall Risk  09/28/2017 03/29/2017 05/14/2016 01/01/2016 08/19/2015  Falls in the past year? No No No No No     Functional Status Survey: Is the patient deaf or have difficulty hearing?: No Does the patient have difficulty seeing, even when wearing glasses/contacts?: No Does the patient have difficulty concentrating, remembering, or making decisions?: No Does the patient have difficulty walking or climbing stairs?: No Does the patient have difficulty dressing or bathing?: No Does the patient have difficulty doing errands alone such as visiting a doctor's office or shopping?: No    Assessment & Plan  1. GAD (generalized anxiety disorder)  - busPIRone (BUSPAR) 15 MG tablet; Take 1 tablet (15 mg total) by mouth 2 (two) times daily.  Dispense: 60 tablet; Refill: 1  2. Dyslipidemia  - atorvastatin (LIPITOR) 40 MG tablet; Take 1 tablet (40 mg total) by mouth daily.  Dispense: 30 tablet; Refill: 5  3. Perennial allergic rhinitis with seasonal variation  - montelukast (SINGULAIR) 10 MG  tablet; Take 1 tablet (10 mg total) by mouth at bedtime.  Dispense: 30 tablet; Refill: 5  4. Hypertension, benign  - telmisartan (MICARDIS) 80 MG tablet; Take 1 tablet (80 mg total) by mouth daily.  Dispense: 30 tablet; Refill: 5  5. Circadian rhythm sleep disorder  - zolpidem (AMBIEN) 10 MG tablet; Take 1 tablet (10 mg total) by mouth at bedtime as needed for sleep.  Dispense: 30 tablet; Refill: 0  6. Acute bronchitis, unspecified organism  - benzonatate (TESSALON) 100 MG capsule; Take 1-2 capsules (100-200 mg total) by mouth 3 (three) times daily as needed.  Dispense: 40 capsule; Refill: 0

## 2017-11-14 ENCOUNTER — Other Ambulatory Visit: Payer: Self-pay | Admitting: Family Medicine

## 2017-11-14 DIAGNOSIS — F411 Generalized anxiety disorder: Secondary | ICD-10-CM

## 2018-01-11 DIAGNOSIS — J0140 Acute pansinusitis, unspecified: Secondary | ICD-10-CM | POA: Diagnosis not present

## 2018-04-14 ENCOUNTER — Other Ambulatory Visit: Payer: Self-pay | Admitting: Family Medicine

## 2018-04-14 DIAGNOSIS — J3089 Other allergic rhinitis: Principal | ICD-10-CM

## 2018-04-14 DIAGNOSIS — J302 Other seasonal allergic rhinitis: Secondary | ICD-10-CM

## 2018-04-14 NOTE — Telephone Encounter (Signed)
Refill request for general medication: Singulair 10 mg  Last office visit: 09/28/2017  Last physical exam: None indicated  Follow-ups on file. 05/18/18

## 2018-05-14 ENCOUNTER — Other Ambulatory Visit: Payer: Self-pay | Admitting: Family Medicine

## 2018-05-14 DIAGNOSIS — I10 Essential (primary) hypertension: Secondary | ICD-10-CM

## 2018-05-14 DIAGNOSIS — E785 Hyperlipidemia, unspecified: Secondary | ICD-10-CM

## 2018-05-18 ENCOUNTER — Encounter: Payer: Self-pay | Admitting: Family Medicine

## 2018-05-18 ENCOUNTER — Ambulatory Visit: Payer: 59 | Admitting: Family Medicine

## 2018-05-18 VITALS — BP 100/70 | HR 92 | Temp 98.1°F | Resp 16 | Ht 73.0 in | Wt 222.1 lb

## 2018-05-18 DIAGNOSIS — N400 Enlarged prostate without lower urinary tract symptoms: Secondary | ICD-10-CM

## 2018-05-18 DIAGNOSIS — E785 Hyperlipidemia, unspecified: Secondary | ICD-10-CM

## 2018-05-18 DIAGNOSIS — J0101 Acute recurrent maxillary sinusitis: Secondary | ICD-10-CM

## 2018-05-18 DIAGNOSIS — I10 Essential (primary) hypertension: Secondary | ICD-10-CM

## 2018-05-18 DIAGNOSIS — J3089 Other allergic rhinitis: Secondary | ICD-10-CM | POA: Diagnosis not present

## 2018-05-18 DIAGNOSIS — G4720 Circadian rhythm sleep disorder, unspecified type: Secondary | ICD-10-CM

## 2018-05-18 DIAGNOSIS — Z1159 Encounter for screening for other viral diseases: Secondary | ICD-10-CM | POA: Diagnosis not present

## 2018-05-18 DIAGNOSIS — J302 Other seasonal allergic rhinitis: Secondary | ICD-10-CM

## 2018-05-18 DIAGNOSIS — R972 Elevated prostate specific antigen [PSA]: Secondary | ICD-10-CM

## 2018-05-18 DIAGNOSIS — M19041 Primary osteoarthritis, right hand: Secondary | ICD-10-CM

## 2018-05-18 DIAGNOSIS — F411 Generalized anxiety disorder: Secondary | ICD-10-CM

## 2018-05-18 DIAGNOSIS — R739 Hyperglycemia, unspecified: Secondary | ICD-10-CM

## 2018-05-18 DIAGNOSIS — M19042 Primary osteoarthritis, left hand: Secondary | ICD-10-CM

## 2018-05-18 LAB — HM HEPATITIS C SCREENING LAB: HM Hepatitis Screen: NEGATIVE

## 2018-05-18 MED ORDER — TELMISARTAN 80 MG PO TABS: 80.0000 mg | ORAL_TABLET | Freq: Every day | ORAL | 5 refills | Status: DC

## 2018-05-18 MED ORDER — CEFDINIR 300 MG PO CAPS: 300.0000 mg | ORAL_CAPSULE | Freq: Two times a day (BID) | ORAL | 0 refills | Status: DC

## 2018-05-18 MED ORDER — MELOXICAM 15 MG PO TABS: 15.0000 mg | ORAL_TABLET | Freq: Every day | ORAL | 1 refills | Status: DC

## 2018-05-18 MED ORDER — MONTELUKAST SODIUM 10 MG PO TABS: 10.0000 mg | ORAL_TABLET | Freq: Every day | ORAL | 5 refills | Status: DC

## 2018-05-18 MED ORDER — BUSPIRONE HCL 15 MG PO TABS: 15.0000 mg | ORAL_TABLET | Freq: Two times a day (BID) | ORAL | 5 refills | Status: DC

## 2018-05-18 NOTE — Progress Notes (Signed)
Name: Gavin Scott   MRN: [id number]    DOB: Aug 25, 1965   Date:05/18/2018       Progress Note  Subjective  Chief Complaint  Chief Complaint  Patient presents with  . Depression  . Anxiety  . Sinusitis  . Medication Refill    HPI  HTN: taking medication, denies chest pain or palpitation, SBP is slightly low. Compliant with medication. Denies dizziness.   Hyperlipidemia: LDL was at goal, taking Lipitor.Last HDL was 33, but he has  increased fish, tree nuts and more fruit and vegetables on his diet. He is due for repeat in labs   Insomnia: only takes medicationAmbienprn , mostly when he travels, but has been taking Melatonin. He still has medication at home   Depression/GAD: he recently lost his father 05/2018. Taking Buspar and is happy with results, feeling guilty because it was a relieve that his father died, he was in memory care and had multiple trips to the hospital before he died.   Nocturia:seen by Urologist andwas taking Flomax but did not work, he is off HCTZ and he states nocturia improved since. Prostate biopsies negative dec 2017 , but he lost to follow up, he is due for PSA and rectal exam, advised to return for CPE  Hyperglycemia: he denies polyphagia, polydipsia or polyuria.  We will recheck A1C today   He has pain and swelling on hands, , sometimes it gets red and swelling, he uses his hands at work, worse at the end of the day, he states otc medication does not work, but has tried meloxicam, discussed long term use and risk of nsaid's , advised to take prn   Recurrent sinusitis: he states felt sick 5 days ago, initially body aches, subjective fever, headache, nasal congestion, and yellow drainage. He states rhinorrhea improved, but having more post-nasal drainage, facial pressure and is flying for his father's funeral this weekend and would like to be treated now.    Patient Active Problem List   Diagnosis Date Noted  . Colon cancer screening   . Rectal  polyp   . Benign neoplasm of ascending colon   . Acute non-recurrent maxillary sinusitis 05/14/2016  . GERD (gastroesophageal reflux disease) 08/19/2015  . Depression 01/04/2015  . Generalized anxiety disorder 12/18/2014  . Basal cell carcinoma 12/18/2014  . Benign essential HTN 12/18/2014  . Circadian rhythm disorder 12/18/2014  . History of prostate biopsy 12/18/2014  . Dyslipidemia 12/18/2014  . Blood glucose elevated 12/18/2014  . Obesity (BMI 30.0-34.9) 12/18/2014  . Perennial allergic rhinitis with seasonal variation 12/18/2014  . Ear drum perforation 12/18/2014  . Motion sickness 12/18/2014  . Engages in travel abroad 12/18/2014  . Dysmetabolic syndrome 66/44/0347    Past Surgical History:  Procedure Laterality Date  . COLONOSCOPY WITH PROPOFOL N/A 09/29/2016   Procedure: COLONOSCOPY WITH PROPOFOL;  Surgeon: [name], MD;  Location: [facility] ENDOSCOPY;  Service: Endoscopy;  Laterality: N/A;  . PROSTATE BIOPSY     [facility]-Negative   . TOTAL SHOULDER REPLACEMENT Right    Outpatient     Family History  Problem Relation Age of Onset  . Hyperlipidemia Mother   . Alcohol abuse Father   . Stroke Father   . Hyperlipidemia Father   . Dementia Father     Social History   Socioeconomic History  . Marital status: Married    Spouse name: Not on file  . Number of children: 2  . Years of education: Not on file  . Highest education level: Bachelor's  degree (e.g., BA, AB, BS)  Occupational History  . Occupation: Customer service manager   Social Needs  . Financial resource strain: Not on file  . Food insecurity:    Worry: Not on file    Inability: Not on file  . Transportation needs:    Medical: Not on file    Non-medical: Not on file  Tobacco Use  . Smoking status: Never Smoker  . Smokeless tobacco: Former Systems developer    Types: Snuff, Chew  Substance and Sexual Activity  . Alcohol use: Yes    Alcohol/week: 1.0 standard drinks    Types: 1 Cans of beer per week     Comment: occasionally  . Drug use: No  . Sexual activity: Yes    Partners: Female  Lifestyle  . Physical activity:    Days per week: Not on file    Minutes per session: Not on file  . Stress: Not on file  Relationships  . Social connections:    Talks on phone: Not on file    Gets together: Not on file    Attends religious service: Not on file    Active member of club or organization: Not on file    Attends meetings of clubs or organizations: Not on file    Relationship status: Not on file  . Intimate partner violence:    Fear of current or ex partner: Not on file    Emotionally abused: Not on file    Physically abused: Not on file    Forced sexual activity: Not on file  Other Topics Concern  . Not on file  Social History Narrative  . Not on file     Current Outpatient Medications:  .  albuterol (PROVENTIL HFA;VENTOLIN HFA) 108 (90 Base) MCG/ACT inhaler, Inhale 2 puffs into the lungs daily., Disp: , Rfl: 1 .  Ascorbic Acid (VITAMIN C) 1000 MG tablet, Take 1 tablet by mouth., Disp: , Rfl:  .  aspirin (RA ASPIRIN EC) 81 MG EC tablet, Take 1 tablet (81 mg total) by mouth daily. Swallow whole., Disp: 30 tablet, Rfl: 12 .  atorvastatin (LIPITOR) 40 MG tablet, TAKE 1 TABLET(40 MG) BY MOUTH DAILY, Disp: 30 tablet, Rfl: 0 .  azelastine (ASTELIN) 0.1 % nasal spray, Place 1 spray into both nostrils 2 (two) times daily., Disp: 30 mL, Rfl: 5 .  busPIRone (BUSPAR) 15 MG tablet, Take 1 tablet (15 mg total) by mouth 2 (two) times daily., Disp: 60 tablet, Rfl: 5 .  fexofenadine (ALLEGRA ALLERGY) 180 MG tablet, Take by mouth., Disp: , Rfl:  .  montelukast (SINGULAIR) 10 MG tablet, Take 1 tablet (10 mg total) by mouth at bedtime., Disp: 30 tablet, Rfl: 5 .  Omega-3 Fatty Acids (RA FISH OIL) 1000 MG CAPS, Take 1 capsule by mouth 2 (two) times daily., Disp: , Rfl:  .  telmisartan (MICARDIS) 80 MG tablet, Take 1 tablet (80 mg total) by mouth daily., Disp: 30 tablet, Rfl: 5 .  meloxicam (MOBIC)  15 MG tablet, Take 1 tablet (15 mg total) by mouth daily., Disp: 30 tablet, Rfl: 1 .  zolpidem (AMBIEN) 10 MG tablet, Take 1 tablet (10 mg total) by mouth at bedtime as needed for sleep., Disp: 30 tablet, Rfl: 0  Allergies  Allergen Reactions  . Ace Inhibitors Cough  . Codeine Hives and Itching    I personally reviewed active problem list, medication list, allergies, family history with the patient/caregiver today.   ROS  Constitutional: Negative for fever or weight change.  Respiratory: Negative for cough and shortness of breath.   Cardiovascular: Negative for chest pain or palpitations.  Gastrointestinal: Negative for abdominal pain, no bowel changes.  Musculoskeletal: Negative for gait problem or joint swelling.  Skin: Negative for rash.  Neurological: Negative for dizziness or headache.  No other specific complaints in a complete review of systems (except as listed in HPI above).  Objective  Vitals:   05/18/18 1018  BP: 100/70  Pulse: 92  Resp: 16  Temp: 98.1 F (36.7 C)  TempSrc: Oral  SpO2: 96%  Weight: 222 lb 1.6 oz (100.7 kg)  Height: 6\' 1"  (1.854 m)    Body mass index is 29.3 kg/m.  Physical Exam  Constitutional: Patient appears well-developed and well-nourished. Overweight.  No distress.  HEENT: head atraumatic, normocephalic, pupils equal and reactive to light, ears normal, tender maxillary sinus neck supple, throat within normal limits Cardiovascular: Normal rate, regular rhythm and normal heart sounds.  No murmur heard. No BLE edema. Pulmonary/Chest: Effort normal and breath sounds normal. No respiratory distress. Abdominal: Soft.  There is no tenderness. Psychiatric: Patient has a normal mood and affect. behavior is normal. Judgment and thought content normal.  PHQ2/9: Depression screen [facility] 2/9 05/18/2018 09/28/2017 05/14/2016 01/01/2016 08/19/2015  Decreased Interest 0 0 0 0 0  Down, Depressed, Hopeless 0 1 0 0 0  PHQ - 2 Score 0 1 0 0 0  Altered sleeping  1 3 - - -  Tired, decreased energy 2 2 - - -  Change in appetite 1 0 - - -  Feeling bad or failure about yourself  0 0 - - -  Trouble concentrating 0 0 - - -  Moving slowly or fidgety/restless 0 0 - - -  Suicidal thoughts 0 0 - - -  PHQ-9 Score 4 6 - - -  Difficult doing work/chores Somewhat difficult Somewhat difficult - - -     Fall Risk: Fall Risk  05/18/2018 09/28/2017 03/29/2017 05/14/2016 01/01/2016  Falls in the past year? 0 No No No No  Number falls in past yr: 0 - - - -  Injury with Fall? 0 - - - -     Assessment & Plan  1. Hypertension, benign  - COMPLETE METABOLIC PANEL WITH GFR - telmisartan (MICARDIS) 80 MG tablet; Take 1 tablet (80 mg total) by mouth daily.  Dispense: 30 tablet; Refill: 5  2. Hyperglycemia  - Hemoglobin A1c  3. Dyslipidemia  - Lipid panel  4. Perennial allergic rhinitis with seasonal variation  - montelukast (SINGULAIR) 10 MG tablet; Take 1 tablet (10 mg total) by mouth at bedtime.  Dispense: 30 tablet; Refill: 5  5. GAD (generalized anxiety disorder)  - busPIRone (BUSPAR) 15 MG tablet; Take 1 tablet (15 mg total) by mouth 2 (two) times daily.  Dispense: 60 tablet; Refill: 5  6. Circadian rhythm sleep disorder  Taking medication prn   7. Acute recurrent maxillary sinusitis  Sending omnicef   8. BPH with elevated PSA  - PSA  9. Need for hepatitis C screening test  - Hepatitis C antibody  10. Osteoarthritis of fingers of both hands  - meloxicam (MOBIC) 15 MG tablet; Take 1 tablet (15 mg total) by mouth daily.  Dispense: 30 tablet; Refill: 1

## 2018-05-19 LAB — COMPLETE METABOLIC PANEL WITH GFR
AG Ratio: 1.8 (calc) (ref 1.0–2.5)
ALT: 38 U/L (ref 9–46)
AST: 23 U/L (ref 10–35)
Albumin: 4.2 g/dL (ref 3.6–5.1)
Alkaline phosphatase (APISO): 62 U/L (ref 35–144)
BUN: 20 mg/dL (ref 7–25)
CO2: 28 mmol/L (ref 20–32)
Calcium: 9 mg/dL (ref 8.6–10.3)
Chloride: 104 mmol/L (ref 98–110)
Creat: 0.83 mg/dL (ref 0.70–1.33)
GFR, Est African American: 117 mL/min/{1.73_m2} (ref >=60)
GFR, Est Non African American: 101 mL/min/{1.73_m2} (ref >=60)
Globulin: 2.3 g/dL (calc) (ref 1.9–3.7)
Glucose, Bld: 85 mg/dL (ref 65–99)
Potassium: 4.8 mmol/L (ref 3.5–5.3)
Sodium: 141 mmol/L (ref 135–146)
Total Bilirubin: 0.4 mg/dL (ref 0.2–1.2)
Total Protein: 6.5 g/dL (ref 6.1–8.1)

## 2018-05-19 LAB — HEMOGLOBIN A1C
Hgb A1c MFr Bld: 5.5 % of total Hgb (ref <5.7)
Mean Plasma Glucose: 111 (calc)
eAG (mmol/L): 6.2 (calc)

## 2018-05-19 LAB — HEPATITIS C ANTIBODY
Hepatitis C Ab: NONREACTIVE
SIGNAL TO CUT-OFF: 0.01 (ref <1.00)

## 2018-05-19 LAB — LIPID PANEL
Cholesterol: 96 mg/dL (ref <200)
HDL: 26 mg/dL — ABNORMAL LOW (ref >=40)
LDL Cholesterol (Calc): 41 mg/dL (calc)
NON-HDL CHOLESTEROL (CALC): 70 mg/dL (ref <130)
Total CHOL/HDL Ratio: 3.7 (calc) (ref <5.0)
Triglycerides: 217 mg/dL — ABNORMAL HIGH (ref <150)

## 2018-05-19 LAB — PSA: PSA: 5.5 ng/mL — AB (ref <=4.0)

## 2018-05-23 ENCOUNTER — Encounter: Payer: Self-pay | Admitting: Family Medicine

## 2018-05-23 ENCOUNTER — Other Ambulatory Visit: Payer: Self-pay | Admitting: Family Medicine

## 2018-05-23 DIAGNOSIS — N400 Enlarged prostate without lower urinary tract symptoms: Secondary | ICD-10-CM

## 2018-05-23 DIAGNOSIS — R972 Elevated prostate specific antigen [PSA]: Secondary | ICD-10-CM

## 2018-06-14 ENCOUNTER — Other Ambulatory Visit: Payer: Self-pay | Admitting: Family Medicine

## 2018-06-14 DIAGNOSIS — E785 Hyperlipidemia, unspecified: Secondary | ICD-10-CM

## 2018-06-17 ENCOUNTER — Ambulatory Visit: Payer: 59 | Admitting: Urology

## 2018-06-17 ENCOUNTER — Encounter: Payer: Self-pay | Admitting: Urology

## 2018-06-17 VITALS — BP 120/81 | HR 84 | Ht 73.0 in | Wt 226.0 lb

## 2018-06-17 DIAGNOSIS — R351 Nocturia: Secondary | ICD-10-CM

## 2018-06-17 DIAGNOSIS — N402 Nodular prostate without lower urinary tract symptoms: Secondary | ICD-10-CM | POA: Diagnosis not present

## 2018-06-17 DIAGNOSIS — N401 Enlarged prostate with lower urinary tract symptoms: Secondary | ICD-10-CM

## 2018-06-17 DIAGNOSIS — R972 Elevated prostate specific antigen [PSA]: Secondary | ICD-10-CM

## 2018-06-17 DIAGNOSIS — N138 Other obstructive and reflux uropathy: Secondary | ICD-10-CM

## 2018-06-17 DIAGNOSIS — N5201 Erectile dysfunction due to arterial insufficiency: Secondary | ICD-10-CM | POA: Diagnosis not present

## 2018-06-17 MED ORDER — TADALAFIL 5 MG PO TABS: 5.0000 mg | ORAL_TABLET | Freq: Every day | ORAL | 3 refills | Status: DC

## 2018-06-17 NOTE — Patient Instructions (Signed)

## 2018-06-17 NOTE — Progress Notes (Signed)
06/17/2018 8:43 AM   Gavin Scott 05-18-1965 [id number]  Referring provider: Steele Sizer, MD [address] [place], [place] [address]  Chief Complaint  Patient presents with  . Elevated PSA    HPI: Gavin Scott is a 53 yo WM referred for an elevated PSA.  PSA was 5.5 in February 2020.  PSA is rising: February 2016 PSA 1.22 May 2015 PSA 2.14 Feb 2016 PSA 4.3, prostate bx negative  February 2020 PSA 5.5  He has a h/o "BPH" at age 42. He recalls two prostate biopsies. He lived in [place]. Then in 2017 he underwent prostate biopsy with Dr. Ivory Broad for a PSA of 4.3 which was negative.  We could not find the procedure note or exam on the old system.    His AUASS = 6. He has noc x 2-3. He recalls use of Proscar in the past. He tried tamsulosin. No change. He doesn't snore.   He has trouble maintaining erection. He gets a pretty good erection.   Modifying factors: There are no other modifying factors  Associated signs and symptoms: There are no other associated signs and symptoms Aggravating and relieving factors: There are no other aggravating or relieving factors Severity: Moderate Duration: Persistent    PMH: Past Medical History:  Diagnosis Date  . Allergy   . Anxiety   . Basal cell carcinoma    Dr. Tyler Deis  . Circadian rhythm disorder   . Hyperlipidemia   . Hypertension   . Metabolic syndrome   . Obesity   . Perforation of left tympanic membrane     Surgical History: Past Surgical History:  Procedure Laterality Date  . COLONOSCOPY WITH PROPOFOL N/A 09/29/2016   Procedure: COLONOSCOPY WITH PROPOFOL;  Surgeon: [name], MD;  Location: [facility] ENDOSCOPY;  Service: Endoscopy;  Laterality: N/A;  . PROSTATE BIOPSY     [facility]-Negative   . TOTAL SHOULDER REPLACEMENT Right    Outpatient     Home Medications:  Allergies as of 06/17/2018      Reactions   Ace Inhibitors Cough   Codeine Hives, Itching      Medication List       Accurate as of  June 17, 2018  8:43 AM. Always use your most recent med list.        albuterol 108 (90 Base) MCG/ACT inhaler Commonly known as:  PROVENTIL HFA;VENTOLIN HFA Inhale 2 puffs into the lungs daily.   Allegra Allergy 180 MG tablet Generic drug:  fexofenadine Take by mouth.   aspirin 81 MG EC tablet Commonly known as:  RA Aspirin EC Take 1 tablet (81 mg total) by mouth daily. Swallow whole.   atorvastatin 40 MG tablet Commonly known as:  LIPITOR TAKE 1 TABLET(40 MG) BY MOUTH DAILY   azelastine 0.1 % nasal spray Commonly known as:  ASTELIN Place 1 spray into both nostrils 2 (two) times daily.   busPIRone 15 MG tablet Commonly known as:  BUSPAR Take 1 tablet (15 mg total) by mouth 2 (two) times daily.   meloxicam 15 MG tablet Commonly known as:  MOBIC Take 1 tablet (15 mg total) by mouth daily.   montelukast 10 MG tablet Commonly known as:  SINGULAIR Take 1 tablet (10 mg total) by mouth at bedtime.   RA Fish Oil 1000 MG Caps Take 1 capsule by mouth 2 (two) times daily.   telmisartan 80 MG tablet Commonly known as:  MICARDIS Take 1 tablet (80 mg total) by mouth daily.   vitamin C  1000 MG tablet Take 1 tablet by mouth.   zolpidem 10 MG tablet Commonly known as:  AMBIEN Take 1 tablet (10 mg total) by mouth at bedtime as needed for sleep.       Allergies:  Allergies  Allergen Reactions  . Ace Inhibitors Cough  . Codeine Hives and Itching    Family History: Family History  Problem Relation Age of Onset  . Hyperlipidemia Mother   . Alcohol abuse Father   . Stroke Father   . Hyperlipidemia Father   . Dementia Father     Social History:  reports that he has never smoked. He has quit using smokeless tobacco.  His smokeless tobacco use included snuff and chew. He reports current alcohol use of about 1.0 standard drinks of alcohol per week. He reports that he does not use drugs.  ROS: UROLOGY Frequent Urination?: Yes Hard to postpone urination?: No Burning/pain  with urination?: No Get up at night to urinate?: Yes Leakage of urine?: No Urine stream starts and stops?: No Trouble starting stream?: No Do you have to strain to urinate?: No Blood in urine?: No Urinary tract infection?: No Sexually transmitted disease?: No Injury to kidneys or bladder?: No Painful intercourse?: No Weak stream?: No Erection problems?: Yes Penile pain?: No  Gastrointestinal Nausea?: No Vomiting?: No Indigestion/heartburn?: No Diarrhea?: No Constipation?: No  Constitutional Fever: No Night sweats?: No Weight loss?: No Fatigue?: No  Skin Skin rash/lesions?: No Itching?: No  Eyes Blurred vision?: No Double vision?: No  Ears/Nose/Throat Sore throat?: No Sinus problems?: No  Hematologic/Lymphatic Swollen glands?: No Easy bruising?: No  Cardiovascular Leg swelling?: No Chest pain?: No  Respiratory Cough?: No Shortness of breath?: No  Endocrine Excessive thirst?: No  Musculoskeletal Back pain?: No Joint pain?: No  Neurological Headaches?: No Dizziness?: No  Psychologic Depression?: No Anxiety?: No  Physical Exam: BP 120/81 (BP Location: Left Arm, Patient Position: Sitting, Cuff Size: Normal)   Pulse 84   Ht 6\' 1"  (1.854 m)   Wt 102.5 kg   BMI 29.82 kg/m   Constitutional:  Alert and oriented, No acute distress. HEENT: [place] AT, moist mucus membranes.  Trachea midline, no masses. Cardiovascular: No clubbing, cyanosis, or edema. Respiratory: Normal respiratory effort, no increased work of breathing. GI: Abdomen is soft, nontender, nondistended, no abdominal masses GU: No CVA tenderness Lymph: No cervical or inguinal lymphadenopathy. Skin: No rashes, bruises or suspicious lesions. Neurologic: Grossly intact, no focal deficits, moving all 4 extremities. Psychiatric: Normal mood and affect. DRE: prostate ~30 g, 10 mm left apical nodule - smooth - all landmarks preserved.   Laboratory Data: Lab Results  Component Value Date    WBC 6.1 04/07/2017   HGB 16.0 04/07/2017   HCT 47.2 04/07/2017   MCV 85.5 04/07/2017   PLT 251 04/07/2017    Lab Results  Component Value Date   CREATININE 0.83 05/18/2018    Lab Results  Component Value Date   PSA 5.5 (H) 05/18/2018   PSA 2.3 12/26/2014   PSA 1.8 07/07/2013    No results found for: TESTOSTERONE  Lab Results  Component Value Date   HGBA1C 5.5 05/18/2018    Urinalysis No results found for: COLORURINE, APPEARANCEUR, LABSPEC, PHURINE, GLUCOSEU, HGBUR, BILIRUBINUR, KETONESUR, PROTEINUR, UROBILINOGEN, NITRITE, LEUKOCYTESUR  No results found for: LABMICR, [place], RBCUA, LABEPIT, MUCUS, BACTERIA  No results found for this or any previous visit. No results found for this or any previous visit. No results found for this or any previous visit. No results found  for this or any previous visit. No results found for this or any previous visit. No results found for this or any previous visit. No results found for this or any previous visit. No results found for this or any previous visit.  Assessment & Plan:    1) PSA elevation, prostate nodule - check MRI. Looking back I suspect the prostate nodule triggered his biopsies.   2) BPH, luts - he doesn't snore. Discussed tamsulosin and daily pde5i. 5ari would be counterproductive. He will start daily tadalafil.   3) ED - discussed the nature r/b of pde5i.   No follow-ups on file.  Festus Aloe, MD  [facility] [address] [place], [place] [address] [phone]

## 2018-06-23 ENCOUNTER — Telehealth: Payer: Self-pay | Admitting: Family Medicine

## 2018-06-23 NOTE — Telephone Encounter (Signed)
Tadalifil approved [id number]  06/23/2018-06/23/2019

## 2018-07-18 ENCOUNTER — Ambulatory Visit: Payer: 59 | Admitting: Urology

## 2018-07-20 ENCOUNTER — Other Ambulatory Visit: Payer: Self-pay | Admitting: Family Medicine

## 2018-07-20 DIAGNOSIS — I10 Essential (primary) hypertension: Secondary | ICD-10-CM

## 2018-07-20 DIAGNOSIS — F411 Generalized anxiety disorder: Secondary | ICD-10-CM

## 2018-08-03 ENCOUNTER — Ambulatory Visit: Payer: 59 | Admitting: Urology

## 2018-08-04 ENCOUNTER — Encounter: Payer: Self-pay | Admitting: Nurse Practitioner

## 2018-08-04 ENCOUNTER — Encounter: Payer: Self-pay | Admitting: Family Medicine

## 2018-08-05 ENCOUNTER — Other Ambulatory Visit: Payer: Self-pay

## 2018-08-05 ENCOUNTER — Ambulatory Visit
Admission: RE | Admit: 2018-08-05 | Discharge: 2018-08-05 | Disposition: A | Discharge status: Home / Self Care | Payer: 59 | Source: Ambulatory Visit | Attending: Urology | Admitting: Urology

## 2018-08-05 DIAGNOSIS — R972 Elevated prostate specific antigen [PSA]: Secondary | ICD-10-CM | POA: Diagnosis not present

## 2018-08-05 DIAGNOSIS — N402 Nodular prostate without lower urinary tract symptoms: Secondary | ICD-10-CM | POA: Diagnosis not present

## 2018-08-05 IMAGING — MR MR PROSTATE WO/W CM
56 series · 56 of 56 positions shown · IV contrast (Gadavist)
Comparison: None.

CLINICAL DATA: 62-year-old male with elevated PSA. Negative biopsy
[XE]. PSA equal [DATE].

EXAM:
MR PROSTATE WITHOUT AND WITH CONTRAST
TECHNIQUE: Multiplanar multisequence MRI images were obtained of the pelvis
centered about the prostate. Pre and post contrast images were
obtained.
CONTRAST:  10 mL Gadavist

[Series 3: T1 · axial · 8.0mm · 0.74mm/px · 1 of 25 slices shown (1 of 47)]
[im 1/25]
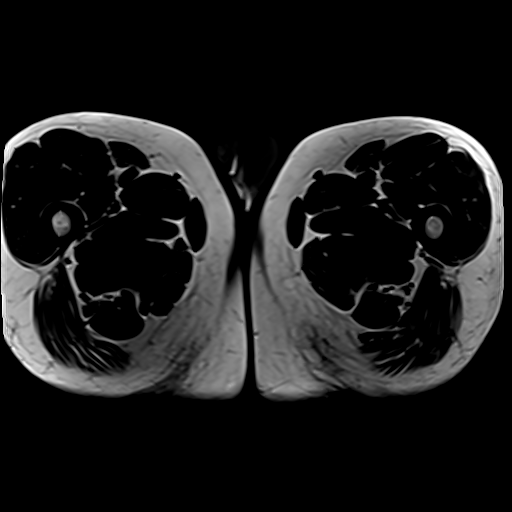

[Series 4: bSSFP fat-sat · axial · 8.0mm · 0.74mm/px · 1 of 25 slices shown]
[im 1/25]
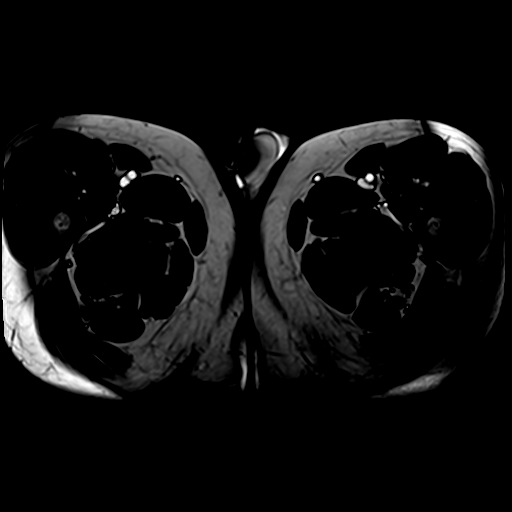

[Series 5: T2 · coronal · 3.0mm · 0.70mm/px · 1 of 30 slices shown (1 of 3)]
[im 1/30]
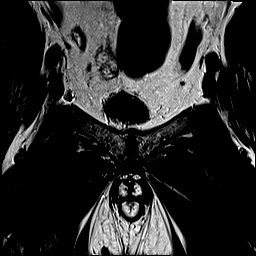

[Series 6: T2 · sagittal · 3.5mm · 0.62mm/px · 1 of 35 slices shown (2 of 3)]
[im 1/35]
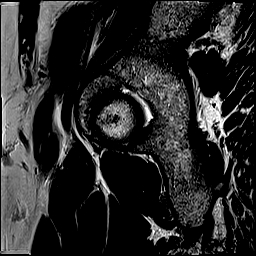

[Series 7: T1 · axial · 3.0mm · 0.35mm/px · 1 of 30 slices shown (2 of 47)]
[im 1/30]
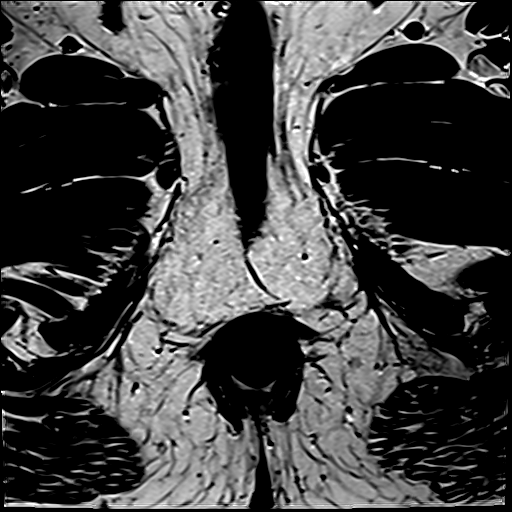

[Series 8: T2 · axial · 3.5mm · 0.56mm/px · 1 of 23 slices shown (3 of 3)]
[im 1/23]
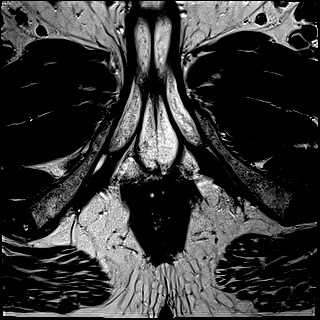

[Series 9: t2_space_tra (id) · axial · 1.0mm · 1.04mm/px · 1 of 80 slices shown]
[im 1/80]
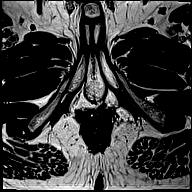

[Series 10: ax dwi_tracew · axial · 3.0mm · 0.78mm/px · 1 of 24 slices shown (1 of 3)]
[im 1/24]
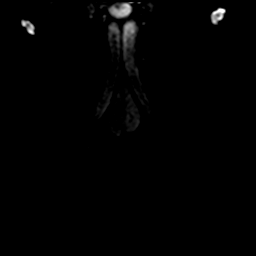

[Series 10: ax dwi_tracew · axial · 3.0mm · 0.78mm/px · 1 of 25 slices shown (2 of 3)]
[im 1/25]
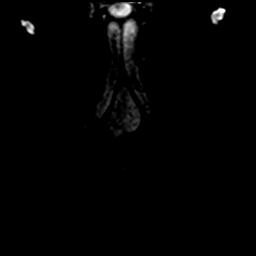

[Series 10: ax dwi_tracew · axial · 3.0mm · 0.78mm/px · 1 of 25 slices shown (3 of 3)]
[im 1/25]
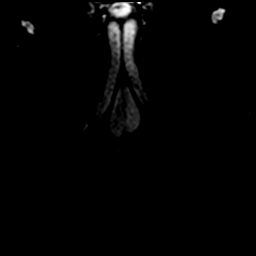

[Series 11: ax dwi_adc · axial · 3.0mm · 0.78mm/px · 1 of 25 slices shown]
[im 1/25]
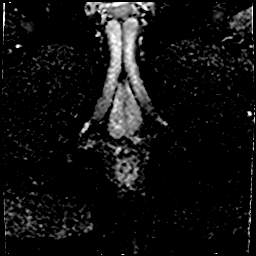

[Series 13: T1 · axial · 3.0mm · 1.15mm/px · 1 of 28 slices shown (3 of 47)]
[im 1/28]
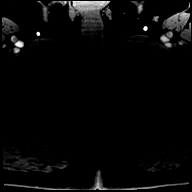

[Series 14: T1 · axial · 3.0mm · 1.15mm/px · 1 of 28 slices shown (4 of 47)]
[im 1/28]
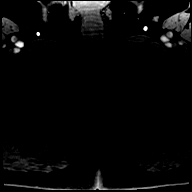

[Series 15: T1 · axial · 3.0mm · 1.15mm/px · 1 of 28 slices shown (5 of 47)]
[im 1/28]
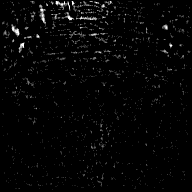

[Series 16: T1 · axial · 3.0mm · 1.15mm/px · 1 of 28 slices shown (6 of 47)]
[im 1/28]
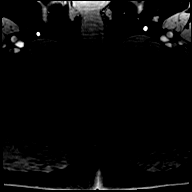

[Series 17: T1 · axial · 3.0mm · 1.15mm/px · 1 of 28 slices shown (7 of 47)]
[im 1/28]
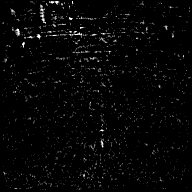

[Series 18: T1 · axial · 3.0mm · 1.15mm/px · 1 of 28 slices shown (8 of 47)]
[im 1/28]
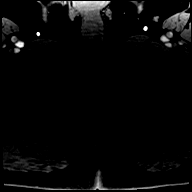

[Series 19: T1 · axial · 3.0mm · 1.15mm/px · 1 of 28 slices shown (9 of 47)]
[im 1/28]
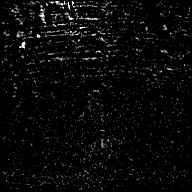

[Series 20: T1 · axial · 3.0mm · 1.15mm/px · 1 of 28 slices shown (10 of 47)]
[im 1/28]
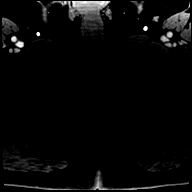

[Series 21: T1 · axial · 3.0mm · 1.15mm/px · 1 of 28 slices shown (11 of 47)]
[im 1/28]
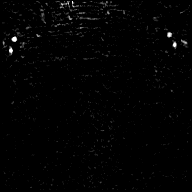

[Series 22: T1 · axial · 3.0mm · 1.15mm/px · 1 of 28 slices shown (12 of 47)]
[im 1/28]
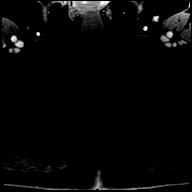

[Series 23: T1 · axial · 3.0mm · 1.15mm/px · 1 of 28 slices shown (13 of 47)]
[im 1/28]
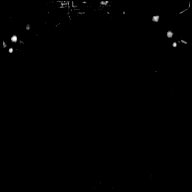

[Series 24: T1 · axial · 3.0mm · 1.15mm/px · 1 of 28 slices shown (14 of 47)]
[im 1/28]
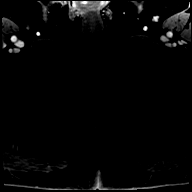

[Series 25: T1 · axial · 3.0mm · 1.15mm/px · 1 of 28 slices shown (15 of 47)]
[im 1/28]
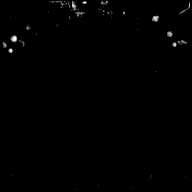

[Series 26: T1 · axial · 3.0mm · 1.15mm/px · 1 of 28 slices shown (16 of 47)]
[im 1/28]
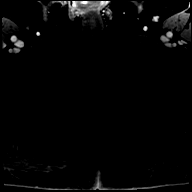

[Series 27: T1 · axial · 3.0mm · 1.15mm/px · 1 of 28 slices shown (17 of 47)]
[im 1/28]
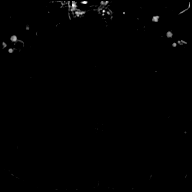

[Series 28: T1 · axial · 3.0mm · 1.15mm/px · 1 of 28 slices shown (18 of 47)]
[im 1/28]
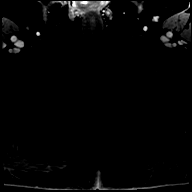

[Series 29: T1 · axial · 3.0mm · 1.15mm/px · 1 of 28 slices shown (19 of 47)]
[im 1/28]
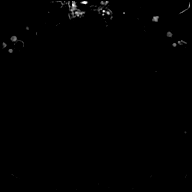

[Series 30: T1 · axial · 3.0mm · 1.15mm/px · 1 of 28 slices shown (20 of 47)]
[im 1/28]
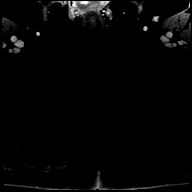

[Series 31: T1 · axial · 3.0mm · 1.15mm/px · 1 of 28 slices shown (21 of 47)]
[im 1/28]
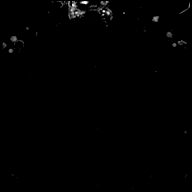

[Series 32: T1 · axial · 3.0mm · 1.15mm/px · 1 of 28 slices shown (22 of 47)]
[im 1/28]
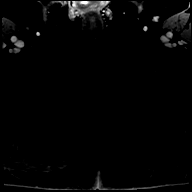

[Series 33: T1 · axial · 3.0mm · 1.15mm/px · 1 of 28 slices shown (23 of 47)]
[im 1/28]
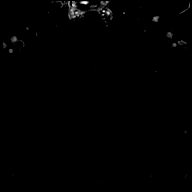

[Series 34: T1 · axial · 3.0mm · 1.15mm/px · 1 of 28 slices shown (24 of 47)]
[im 1/28]
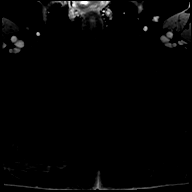

[Series 35: T1 · axial · 3.0mm · 1.15mm/px · 1 of 28 slices shown (25 of 47)]
[im 1/28]
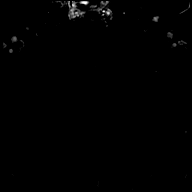

[Series 36: T1 · axial · 3.0mm · 1.15mm/px · 1 of 28 slices shown (26 of 47)]
[im 1/28]
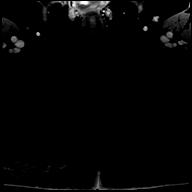

[Series 37: T1 · axial · 3.0mm · 1.15mm/px · 1 of 28 slices shown (27 of 47)]
[im 1/28]
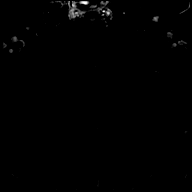

[Series 38: T1 · axial · 3.0mm · 1.15mm/px · 1 of 28 slices shown (28 of 47)]
[im 1/28]
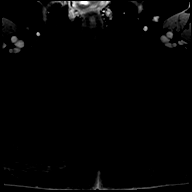

[Series 39: T1 · axial · 3.0mm · 1.15mm/px · 1 of 28 slices shown (29 of 47)]
[im 1/28]
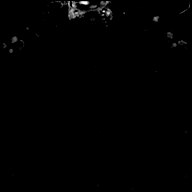

[Series 40: T1 · axial · 3.0mm · 1.15mm/px · 1 of 28 slices shown (30 of 47)]
[im 1/28]
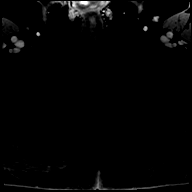

[Series 41: T1 · axial · 3.0mm · 1.15mm/px · 1 of 28 slices shown (31 of 47)]
[im 1/28]
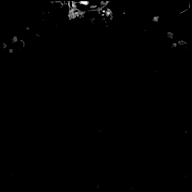

[Series 42: T1 · axial · 3.0mm · 1.15mm/px · 1 of 28 slices shown (32 of 47)]
[im 1/28]
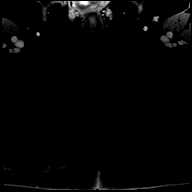

[Series 43: T1 · axial · 3.0mm · 1.15mm/px · 1 of 28 slices shown (33 of 47)]
[im 1/28]
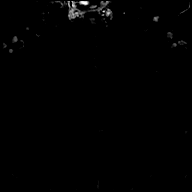

[Series 44: T1 · axial · 3.0mm · 1.15mm/px · 1 of 28 slices shown (34 of 47)]
[im 1/28]
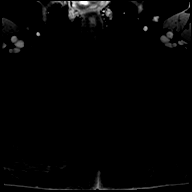

[Series 45: T1 · axial · 3.0mm · 1.15mm/px · 1 of 28 slices shown (35 of 47)]
[im 1/28]
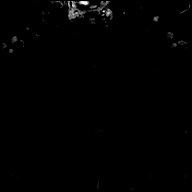

[Series 46: T1 · axial · 3.0mm · 1.15mm/px · 1 of 28 slices shown (36 of 47)]
[im 1/28]
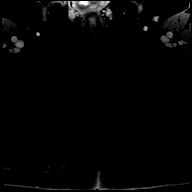

[Series 47: T1 · axial · 3.0mm · 1.15mm/px · 1 of 28 slices shown (37 of 47)]
[im 1/28]
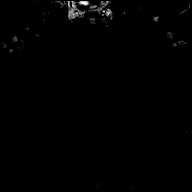

[Series 48: T1 · axial · 3.0mm · 1.15mm/px · 1 of 28 slices shown (38 of 47)]
[im 1/28]
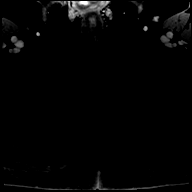

[Series 49: T1 · axial · 3.0mm · 1.15mm/px · 1 of 28 slices shown (39 of 47)]
[im 1/28]
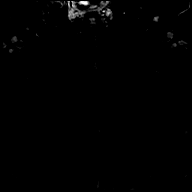

[Series 50: T1 · axial · 3.0mm · 1.15mm/px · 1 of 28 slices shown (40 of 47)]
[im 1/28]
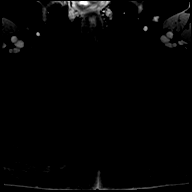

[Series 51: T1 · axial · 3.0mm · 1.15mm/px · 1 of 28 slices shown (41 of 47)]
[im 1/28]
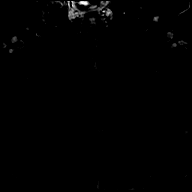

[Series 52: T1 · axial · 3.0mm · 1.15mm/px · 1 of 28 slices shown (42 of 47)]
[im 1/28]
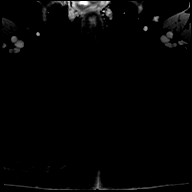

[Series 53: T1 · axial · 3.0mm · 1.15mm/px · 1 of 28 slices shown (43 of 47)]
[im 1/28]
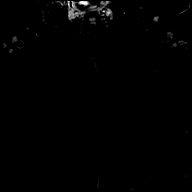

[Series 54: T1 · axial · 3.0mm · 1.15mm/px · 1 of 28 slices shown (44 of 47)]
[im 1/28]
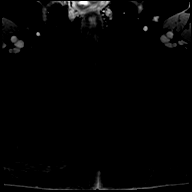

[Series 55: T1 · axial · 3.0mm · 1.15mm/px · 1 of 28 slices shown (45 of 47)]
[im 1/28]
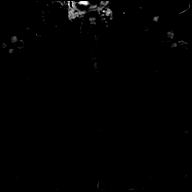

[Series 56: T1 · axial · 3.0mm · 1.15mm/px · 1 of 28 slices shown (46 of 47)]
[im 1/28]
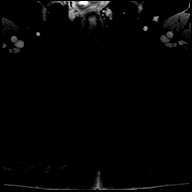

[Series 57: T1 · axial · 3.0mm · 1.15mm/px · 1 of 28 slices shown (47 of 47)]
[im 1/28]
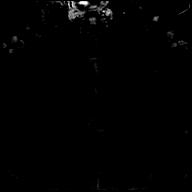

[56 of 56 positions shown; findings below may reference images not displayed]

FINDINGS: Prostate: Region of low signal intensity in the posterior aspect of
the LEFT apex on T2 weighted imaging noted on image [DATE]. This
leison corresponds to a focus of restricted diffusion measuring 15
mm x 13 mm (image [DATE]). Early post-contrast enhancement through
this same region (series 24).

The prostate capsule is intact.  Neurovascular bundles are normal.

The transitional zone is relatively small without suspicious lesion.

Volume: 4.7 by 3.6 by 3.1 cm (volume = 27 cm^3)

Transcapsular spread:  Absent

Seminal vesicle involvement: Absent

Neurovascular bundle involvement: Absent

Pelvic adenopathy: Absent

Bone metastasis: Absent

Other findings: None
IMPRESSION: 1. Concern for high-grade carcinoma in the posterior aspect of the
LEFT apex (PI-RADS 4). 3D post processing (Dynacad) performed.
2. Prostatic capsule intact. Normal seminal vesicles. No
lymphadenopathy.

## 2018-08-05 MED ORDER — GADOBUTROL 1 MMOL/ML IV SOLN
10.0000 mL | Freq: Once | INTRAVENOUS | Status: AC | PRN
  Administered 2018-08-05: 19:00:00 10 mL via INTRAVENOUS

## 2018-08-06 LAB — POCT I-STAT CREATININE: Creatinine, Ser: 0.9 mg/dL (ref 0.61–1.24)

## 2018-08-09 ENCOUNTER — Other Ambulatory Visit: Payer: Self-pay

## 2018-08-09 DIAGNOSIS — N402 Nodular prostate without lower urinary tract symptoms: Secondary | ICD-10-CM

## 2018-08-09 DIAGNOSIS — R972 Elevated prostate specific antigen [PSA]: Secondary | ICD-10-CM

## 2018-08-17 ENCOUNTER — Telehealth: Payer: Self-pay | Admitting: Family Medicine

## 2018-08-17 DIAGNOSIS — M19041 Primary osteoarthritis, right hand: Secondary | ICD-10-CM

## 2018-08-17 DIAGNOSIS — M19042 Primary osteoarthritis, left hand: Principal | ICD-10-CM

## 2018-08-17 NOTE — Telephone Encounter (Signed)
Refill request for general medication. Meloxicam to [facility].   Last office visit 05/18/2018   Follow up on 11/24/2018

## 2018-08-19 DIAGNOSIS — R972 Elevated prostate specific antigen [PSA]: Secondary | ICD-10-CM | POA: Diagnosis not present

## 2018-08-24 ENCOUNTER — Other Ambulatory Visit: Payer: Self-pay | Admitting: Urology

## 2018-08-25 NOTE — Telephone Encounter (Signed)
Pt called to follow up on denied rx for Meloxicam. He would like to know if Dr. Ancil Boozer plans to refill rx or if she needs to have a virtual appt first. Please reach out to pt. He was put on rx two months ago.

## 2018-08-26 DIAGNOSIS — C61 Malignant neoplasm of prostate: Secondary | ICD-10-CM | POA: Diagnosis not present

## 2018-08-26 MED ORDER — MELOXICAM 15 MG PO TABS: 15.0000 mg | ORAL_TABLET | Freq: Every day | ORAL | 0 refills | Status: DC

## 2018-08-26 NOTE — Addendum Note (Signed)
Addended by: Steele Sizer F on: 08/26/2018 04:51 PM   Modules accepted: Orders

## 2018-08-29 NOTE — Telephone Encounter (Signed)
Patient notified of Meloxicam being sent in and the warning of taking it more than absolutely needed to due to kidney and BP issues. Patient verbalized understanding.

## 2018-08-30 ENCOUNTER — Telehealth: Payer: Self-pay | Admitting: Urology

## 2018-08-30 NOTE — Telephone Encounter (Signed)
CHART UPDATE -- The patient was seen in [place] for fusion biopsy.  Pathology revealed Gleason 3+4 disease at the palpable and visible nodule on ultrasound and MRI at the left apex.    Intermediate Risk PCa  PSA 5.5  T2a - left apical nodule  Gleason 3+3 = 6  Gleason 3+4=7, five cores involving the ROI at left apex and left apex  Prostate 36 grams   I had a long discussion with him on treatment options and then a Telehealth with the patient and his wife. I recommended they see Dr. Erlene Quan. They had done some research and a neighbor had seen Dr. Voncille Lo at [facility] and they wanted to be seen there. I made a referral to Dr. Voncille Lo also through the [place] office.

## 2018-08-31 ENCOUNTER — Ambulatory Visit: Payer: 59 | Admitting: Urology

## 2018-09-24 ENCOUNTER — Other Ambulatory Visit: Payer: Self-pay | Admitting: Family Medicine

## 2018-10-05 ENCOUNTER — Other Ambulatory Visit: Payer: Self-pay | Admitting: Family Medicine

## 2018-10-05 MED ORDER — TADALAFIL 5 MG PO TABS: 5.0000 mg | ORAL_TABLET | Freq: Every day | ORAL | 3 refills | Status: DC

## 2018-10-17 HISTORY — PX: PROSTATECTOMY: SHX69

## 2018-10-18 MED ORDER — OXYBUTYNIN CHLORIDE 5 MG PO TABS
5.00 | ORAL_TABLET | ORAL | Status: DC
Start: ? — End: 2018-10-18

## 2018-10-18 MED ORDER — LACTATED RINGERS IV SOLN
10.00 | INTRAVENOUS | Status: DC
Start: ? — End: 2018-10-18

## 2018-10-18 MED ORDER — Medication
2.00 | Status: DC
Start: 2018-10-18 — End: 2018-10-18

## 2018-10-18 MED ORDER — KETOROLAC TROMETHAMINE 15 MG/ML IJ SOLN
15.00 | INTRAMUSCULAR | Status: DC
Start: 2018-10-18 — End: 2018-10-18

## 2018-10-18 MED ORDER — DOCUSATE SODIUM 100 MG PO CAPS
200.00 | ORAL_CAPSULE | ORAL | Status: DC
Start: 2018-10-18 — End: 2018-10-18

## 2018-10-18 MED ORDER — PCCA BIOPEPTIDE BASE EX CREA
300.00 | TOPICAL_CREAM | CUTANEOUS | Status: DC
Start: ? — End: 2018-10-18

## 2018-10-18 MED ORDER — Medication
5.00 | Status: DC
Start: ? — End: 2018-10-18

## 2018-10-18 MED ORDER — QUINERVA 260 MG PO TABS
975.00 | ORAL_TABLET | ORAL | Status: DC
Start: 2018-10-18 — End: 2018-10-18

## 2018-10-18 MED ORDER — Medication
10.00 | Status: DC
Start: 2018-10-18 — End: 2018-10-18

## 2018-11-02 ENCOUNTER — Other Ambulatory Visit: Payer: Self-pay | Admitting: Family Medicine

## 2018-11-02 DIAGNOSIS — F411 Generalized anxiety disorder: Secondary | ICD-10-CM

## 2018-11-24 ENCOUNTER — Encounter: Payer: 59 | Admitting: Family Medicine

## 2018-11-29 ENCOUNTER — Other Ambulatory Visit: Payer: Self-pay | Admitting: Family Medicine

## 2018-11-29 DIAGNOSIS — J302 Other seasonal allergic rhinitis: Secondary | ICD-10-CM

## 2018-11-29 DIAGNOSIS — I10 Essential (primary) hypertension: Secondary | ICD-10-CM

## 2018-12-06 ENCOUNTER — Other Ambulatory Visit: Payer: Self-pay | Admitting: Family Medicine

## 2018-12-06 DIAGNOSIS — F411 Generalized anxiety disorder: Secondary | ICD-10-CM

## 2018-12-06 NOTE — Telephone Encounter (Signed)
Requested medication (s) are due for refill today: yes  Requested medication (s) are on the active medication list: yes Last refill:  11/02/18  Future visit scheduled: yes  Notes to clinic:  Per protocol unable to fill.    Requested Prescriptions  Pending Prescriptions Disp Refills   busPIRone (BUSPAR) 15 MG tablet [Pharmacy Med Name: BUSPIRONE 15MG  TABLETS] 60 tablet 0    Sig: TAKE 1 TABLET(15 MG) BY MOUTH TWICE DAILY     Psychiatry: Anxiolytics/Hypnotics - Non-controlled Failed - 12/06/2018  3:29 AM      Failed - Valid encounter within last 6 months    Recent Outpatient Visits          6 months ago Hypertension, benign   [facility] Steele Sizer, MD   1 year ago Dyslipidemia   [facility] Steele Sizer, MD   1 year ago Dyslipidemia   [id number] Steele Sizer, MD   2 years ago Dyslipidemia   [facility] Steele Sizer, MD   2 years ago Acute non-recurrent maxillary sinusitis   [facility] Roselee Nova, MD      Future Appointments            In 1 month Steele Sizer, MD [facility], [facility]

## 2018-12-10 ENCOUNTER — Other Ambulatory Visit: Payer: Self-pay | Admitting: Family Medicine

## 2018-12-10 DIAGNOSIS — E785 Hyperlipidemia, unspecified: Secondary | ICD-10-CM

## 2018-12-27 ENCOUNTER — Ambulatory Visit: Payer: 59 | Admitting: Family Medicine

## 2019-01-04 ENCOUNTER — Other Ambulatory Visit: Payer: Self-pay | Admitting: Family Medicine

## 2019-01-04 DIAGNOSIS — F411 Generalized anxiety disorder: Secondary | ICD-10-CM

## 2019-01-04 NOTE — Telephone Encounter (Signed)
Requested medication (s) are due for refill today: yes  Requested medication (s) are on the active medication list: yes  Last refill: 12/06/2018  Future visit scheduled: yes  Notes to clinic: review for refill   Requested Prescriptions  Pending Prescriptions Disp Refills   busPIRone (BUSPAR) 15 MG tablet [Pharmacy Med Name: BUSPIRONE 15MG  TABLETS] 60 tablet 0    Sig: TAKE 1 TABLET(15 MG) BY MOUTH TWICE DAILY     Psychiatry: Anxiolytics/Hypnotics - Non-controlled Failed - 01/04/2019  3:28 AM      Failed - Valid encounter within last 6 months    Recent Outpatient Visits          7 months ago Hypertension, benign   [facility] Steele Sizer, MD   1 year ago Dyslipidemia   [facility] Steele Sizer, MD   1 year ago Dyslipidemia   [facility] Steele Sizer, MD   2 years ago Dyslipidemia   [facility] Steele Sizer, MD   2 years ago Acute non-recurrent maxillary sinusitis   [place] A, MD      Future Appointments            In 1 week Steele Sizer, MD [facility], [facility]

## 2019-01-17 ENCOUNTER — Other Ambulatory Visit: Payer: Self-pay

## 2019-01-17 ENCOUNTER — Ambulatory Visit (INDEPENDENT_AMBULATORY_CARE_PROVIDER_SITE_OTHER): Payer: 59 | Admitting: Family Medicine

## 2019-01-17 ENCOUNTER — Encounter: Payer: Self-pay | Admitting: Family Medicine

## 2019-01-17 VITALS — BP 124/86 | HR 73 | Temp 97.5°F | Resp 16 | Ht 72.0 in | Wt 219.6 lb

## 2019-01-17 DIAGNOSIS — I10 Essential (primary) hypertension: Secondary | ICD-10-CM

## 2019-01-17 DIAGNOSIS — E785 Hyperlipidemia, unspecified: Secondary | ICD-10-CM

## 2019-01-17 DIAGNOSIS — Z23 Encounter for immunization: Secondary | ICD-10-CM | POA: Diagnosis not present

## 2019-01-17 DIAGNOSIS — D649 Anemia, unspecified: Secondary | ICD-10-CM

## 2019-01-17 DIAGNOSIS — Z8546 Personal history of malignant neoplasm of prostate: Secondary | ICD-10-CM

## 2019-01-17 DIAGNOSIS — J302 Other seasonal allergic rhinitis: Secondary | ICD-10-CM

## 2019-01-17 DIAGNOSIS — Z818 Family history of other mental and behavioral disorders: Secondary | ICD-10-CM

## 2019-01-17 DIAGNOSIS — Z131 Encounter for screening for diabetes mellitus: Secondary | ICD-10-CM

## 2019-01-17 DIAGNOSIS — F411 Generalized anxiety disorder: Secondary | ICD-10-CM

## 2019-01-17 DIAGNOSIS — Z0000 Encounter for general adult medical examination without abnormal findings: Secondary | ICD-10-CM

## 2019-01-17 DIAGNOSIS — J3089 Other allergic rhinitis: Secondary | ICD-10-CM

## 2019-01-17 MED ORDER — BUSPIRONE HCL 15 MG PO TABS: 15.0000 mg | ORAL_TABLET | Freq: Two times a day (BID) | ORAL | 1 refills | Status: DC

## 2019-01-17 MED ORDER — MONTELUKAST SODIUM 10 MG PO TABS: 10.0000 mg | ORAL_TABLET | Freq: Every day | ORAL | 1 refills | Status: DC

## 2019-01-17 MED ORDER — TELMISARTAN 80 MG PO TABS: 80.0000 mg | ORAL_TABLET | Freq: Every day | ORAL | 1 refills | Status: DC

## 2019-01-17 MED ORDER — MELOXICAM 15 MG PO TABS: 15.0000 mg | ORAL_TABLET | Freq: Every day | ORAL | 0 refills | Status: DC

## 2019-01-17 NOTE — Patient Instructions (Signed)
Preventive Care 72-53 Years Old, Male Preventive care refers to lifestyle choices and visits with your health care provider that can promote health and wellness. This includes:  A yearly physical exam. This is also called an annual well check.  Regular dental and eye exams.  Immunizations.  Screening for certain conditions.  Healthy lifestyle choices, such as eating a healthy diet, getting regular exercise, not using drugs or products that contain nicotine and tobacco, and limiting alcohol use. What can I expect for my preventive care visit? Physical exam Your health care provider will check:  Height and weight. These may be used to calculate body mass index (BMI), which is a measurement that tells if you are at a healthy weight.  Heart rate and blood pressure.  Your skin for abnormal spots. Counseling Your health care provider may ask you questions about:  Alcohol, tobacco, and drug use.  Emotional well-being.  Home and relationship well-being.  Sexual activity.  Eating habits.  Work and work Statistician. What immunizations do I need?  Influenza (flu) vaccine  This is recommended every year. Tetanus, diphtheria, and pertussis (Tdap) vaccine  You may need a Td booster every 10 years. Varicella (chickenpox) vaccine  You may need this vaccine if you have not already been vaccinated. Zoster (shingles) vaccine  You may need this after age 77. Measles, mumps, and rubella (MMR) vaccine  You may need at least one dose of MMR if you were born in 1957 or later. You may also need a second dose. Pneumococcal conjugate (PCV13) vaccine  You may need this if you have certain conditions and were not previously vaccinated. Pneumococcal polysaccharide (PPSV23) vaccine  You may need one or two doses if you smoke cigarettes or if you have certain conditions. Meningococcal conjugate (MenACWY) vaccine  You may need this if you have certain conditions. Hepatitis A vaccine   You may need this if you have certain conditions or if you travel or work in places where you may be exposed to hepatitis A. Hepatitis B vaccine  You may need this if you have certain conditions or if you travel or work in places where you may be exposed to hepatitis B. Haemophilus influenzae type b (Hib) vaccine  You may need this if you have certain risk factors. Human papillomavirus (HPV) vaccine  If recommended by your health care provider, you may need three doses over 6 months. You may receive vaccines as individual doses or as more than one vaccine together in one shot (combination vaccines). Talk with your health care provider about the risks and benefits of combination vaccines. What tests do I need? Blood tests  Lipid and cholesterol levels. These may be checked every 5 years, or more frequently if you are over 15 years old.  Hepatitis C test.  Hepatitis B test. Screening  Lung cancer screening. You may have this screening every year starting at age 25 if you have a 30-pack-year history of smoking and currently smoke or have quit within the past 15 years.  Prostate cancer screening. Recommendations will vary depending on your family history and other risks.  Colorectal cancer screening. All adults should have this screening starting at age 80 and continuing until age 17. Your health care provider may recommend screening at age 63 if you are at increased risk. You will have tests every 1-10 years, depending on your results and the type of screening test.  Diabetes screening. This is done by checking your blood sugar (glucose) after you have not eaten  for a while (fasting). You may have this done every 1-3 years.  Sexually transmitted disease (STD) testing. Follow these instructions at home: Eating and drinking  Eat a diet that includes fresh fruits and vegetables, whole grains, lean protein, and low-fat dairy products.  Take vitamin and mineral supplements as recommended  by your health care provider.  Do not drink alcohol if your health care provider tells you not to drink.  If you drink alcohol: ? Limit how much you have to 0-2 drinks a day. ? Be aware of how much alcohol is in your drink. In the [place] one drink equals one 12 oz bottle of beer (355 mL), one 5 oz glass of wine (148 mL), or one 1 oz glass of hard liquor (44 mL). Lifestyle  Take daily care of your teeth and gums.  Stay active. Exercise for at least 30 minutes on 5 or more days each week.  Do not use any products that contain nicotine or tobacco, such as cigarettes, e-cigarettes, and chewing tobacco. If you need help quitting, ask your health care provider.  If you are sexually active, practice safe sex. Use a condom or other form of protection to prevent STIs (sexually transmitted infections).  Talk with your health care provider about taking a low-dose aspirin every day starting at age 5. What's next?  Go to your health care provider once a year for a well check visit.  Ask your health care provider how often you should have your eyes and teeth checked.  Stay up to date on all vaccines. This information is not intended to replace advice given to you by your health care provider. Make sure you discuss any questions you have with your health care provider. Document Released: 04/26/2015 Document Revised: 03/24/2018 Document Reviewed: 03/24/2018 Elsevier Patient Education  2020 [employer].

## 2019-01-17 NOTE — Progress Notes (Signed)
Name: Gavin Scott   MRN: [id number]    DOB: 02-23-1966   Date:01/17/2019       Progress Note  Subjective  Chief Complaint  Chief Complaint  Patient presents with  . Annual Exam    HPI  Patient presents for annual CPE and follow up  HTN: taking medication, denies chest pain or palpitation, BP is at goal . Compliant with medication. Denies dizziness.   Low testosterone : it was in the 200's but up to mid 300's in July, he has noticed decrease in libido, no ED or fatigue. Explained likely not covered by insurance.   Hyperlipidemia: LDL was at goal, taking Lipitor.HDL still low, but he has been active through COVID-19  Insomnia: only takes medicationAmbienprn , mostly when he travels, but has been taking Melatonin. He still has medication at home , he has not been travelling since COVID-19   Depression/GAD: he recently lost his father 05/2018. Taking Buspar and is happy with results. Now worried about moving his mother to [facility], also may need to move her step-father. He has noticed some memory lapse - but only he notices, discussed symptoms of dementia  History of prostatectomy: doing well, it was for prostate cancer , on Cialis before surgery for BPH and has remained on medication since   Hyperglycemia: he denies polyphagia, polydipsia or polyuria.We will recheck A1C today . Unchanged   USPSTF grade A and B recommendations:  Diet: eats healthy Exercise: active at this time  Depression: phq 9 is negative  Depression screen [facility] 2/9 01/17/2019 05/18/2018 09/28/2017 05/14/2016 01/01/2016  Decreased Interest 0 0 0 0 0  Down, Depressed, Hopeless 0 0 1 0 0  PHQ - 2 Score 0 0 1 0 0  Altered sleeping 0 1 3 - -  Tired, decreased energy 0 2 2 - -  Change in appetite 0 1 0 - -  Feeling bad or failure about yourself  0 0 0 - -  Trouble concentrating 0 0 0 - -  Moving slowly or fidgety/restless 0 0 0 - -  Suicidal thoughts 0 0 0 - -  PHQ-9 Score 0 4 6 - -  Difficult doing work/chores -  Somewhat difficult Somewhat difficult - -    Hypertension:  BP Readings from Last 3 Encounters:  01/17/19 124/86  06/17/18 120/81  05/18/18 100/70    Obesity: Wt Readings from Last 3 Encounters:  01/17/19 219 lb 9.6 oz (99.6 kg)  06/17/18 226 lb (102.5 kg)  05/18/18 222 lb 1.6 oz (100.7 kg)   BMI Readings from Last 3 Encounters:  01/17/19 29.78 kg/m  06/17/18 29.82 kg/m  05/18/18 29.30 kg/m     Lipids:  Lab Results  Component Value Date   CHOL 96 05/18/2018   CHOL 127 04/07/2017   CHOL 143 01/30/2016   Lab Results  Component Value Date   HDL 26 (L) 05/18/2018   HDL 33 (L) 04/07/2017   HDL 30 (L) 01/30/2016   Lab Results  Component Value Date   LDLCALC 41 05/18/2018   LDLCALC 69 04/07/2017   LDLCALC 80 01/30/2016   Lab Results  Component Value Date   TRIG 217 (H) 05/18/2018   TRIG 173 (H) 04/07/2017   TRIG 165 (H) 01/30/2016   Lab Results  Component Value Date   CHOLHDL 3.7 05/18/2018   CHOLHDL 3.8 04/07/2017   No results found for: LDLDIRECT Glucose:  Glucose  Date Value Ref Range Status  01/30/2016 102 (H) 65 - 99 mg/dL Final  Glucose, Bld  Date Value Ref Range Status  05/18/2018 85 65 - 99 mg/dL Final    Comment:    .            Fasting reference interval .   04/07/2017 99 65 - 99 mg/dL Final    Comment:    .            Fasting reference interval .       [facility] Visit from 01/17/2019 in [facility]  AUDIT-C Score  1       Married STD testing and prevention (HIV/chl/gon/syphilis): N/A Hep C: normal Feb 2020   Skin cancer: sees dermatologist Dr. Nicole Kindred  Colorectal cancer: up to date , repeat in 2028  Prostate cancer: s/p robotic prostatectomy and doing well , last PSA was normal   Lab Results  Component Value Date   PSA 5.5 (H) 05/18/2018   PSA 2.3 12/26/2014   PSA 1.8 07/07/2013    Lung cancer:  Low Dose CT Chest recommended if Age 51-80 years, 30 pack-year currently smoking OR have quit w/in  15years. Patient does not qualify.   AAA:  The USPSTF recommends one-time screening with ultrasonography in men ages 18 to 19 years who have ever smoked ECG:  2018   Advanced Care Planning: A voluntary discussion about advance care planning including the explanation and discussion of advance directives.  Discussed health care proxy and Living will, and the patient was able to identify a health care proxy as wife .  Patient does have a living will at present time. If patient does have living will, I have requested they bring this to the clinic to be scanned in to their chart.  Patient Active Problem List   Diagnosis Date Noted  . Colon cancer screening   . Rectal polyp   . Benign neoplasm of ascending colon   . Acute non-recurrent maxillary sinusitis 05/14/2016  . GERD (gastroesophageal reflux disease) 08/19/2015  . Depression 01/04/2015  . Generalized anxiety disorder 12/18/2014  . Basal cell carcinoma 12/18/2014  . Benign essential HTN 12/18/2014  . Circadian rhythm disorder 12/18/2014  . History of prostate biopsy 12/18/2014  . Dyslipidemia 12/18/2014  . Blood glucose elevated 12/18/2014  . Obesity (BMI 30.0-34.9) 12/18/2014  . Perennial allergic rhinitis with seasonal variation 12/18/2014  . Ear drum perforation 12/18/2014  . Motion sickness 12/18/2014  . Engages in travel abroad 12/18/2014  . Dysmetabolic syndrome [id number]    Past Surgical History:  Procedure Laterality Date  . COLONOSCOPY WITH PROPOFOL N/A 09/29/2016   Procedure: COLONOSCOPY WITH PROPOFOL;  Surgeon: [name], MD;  Location: [facility] ENDOSCOPY;  Service: Endoscopy;  Laterality: N/A;  . PROSTATE BIOPSY     [facility]-Negative   . PROSTATECTOMY  10/17/2018  . TOTAL SHOULDER REPLACEMENT Right    Outpatient     Family History  Problem Relation Age of Onset  . Hyperlipidemia Mother   . Alcohol abuse Father   . Stroke Father   . Hyperlipidemia Father   . Dementia Father     Social History    Socioeconomic History  . Marital status: Married    Spouse name: Not on file  . Number of children: 2  . Years of education: Not on file  . Highest education level: Bachelor's degree (e.g., BA, AB, BS)  Occupational History  . Occupation: Customer service manager   Social Needs  . Financial resource strain: Not hard at all  . Food insecurity    Worry: Never  true    Inability: Never true  . Transportation needs    Medical: No    Non-medical: No  Tobacco Use  . Smoking status: Never Smoker  . Smokeless tobacco: Former Systems developer    Types: Snuff, Chew  Substance and Sexual Activity  . Alcohol use: Yes    Alcohol/week: 1.0 standard drinks    Types: 1 Cans of beer per week    Comment: occasionally  . Drug use: No  . Sexual activity: Yes    Partners: Female  Lifestyle  . Physical activity    Days per week: Not on file    Minutes per session: Not on file  . Stress: Not on file  Relationships  . Social Herbalist on phone: Not on file    Gets together: Not on file    Attends religious service: Not on file    Active member of club or organization: Not on file    Attends meetings of clubs or organizations: Not on file    Relationship status: Not on file  . Intimate partner violence    Fear of current or ex partner: No    Emotionally abused: No    Physically abused: No    Forced sexual activity: No  Other Topics Concern  . Not on file  Social History Narrative  . Not on file     Current Outpatient Medications:  .  albuterol (PROVENTIL HFA;VENTOLIN HFA) 108 (90 Base) MCG/ACT inhaler, Inhale 2 puffs into the lungs daily., Disp: , Rfl: 1 .  Ascorbic Acid (VITAMIN C) 1000 MG tablet, Take 1 tablet by mouth., Disp: , Rfl:  .  aspirin (RA ASPIRIN EC) 81 MG EC tablet, Take 1 tablet (81 mg total) by mouth daily. Swallow whole., Disp: 30 tablet, Rfl: 12 .  atorvastatin (LIPITOR) 40 MG tablet, TAKE 1 TABLET(40 MG) BY MOUTH DAILY, Disp: 90 tablet, Rfl: 1 .  azelastine  (ASTELIN) 0.1 % nasal spray, Place 1 spray into both nostrils 2 (two) times daily., Disp: 30 mL, Rfl: 5 .  busPIRone (BUSPAR) 15 MG tablet, TAKE 1 TABLET(15 MG) BY MOUTH TWICE DAILY, Disp: 60 tablet, Rfl: 0 .  fexofenadine (ALLEGRA ALLERGY) 180 MG tablet, Take by mouth., Disp: , Rfl:  .  meloxicam (MOBIC) 15 MG tablet, TAKE 1 TABLET(15 MG) BY MOUTH DAILY, Disp: 30 tablet, Rfl: 0 .  montelukast (SINGULAIR) 10 MG tablet, TAKE 1 TABLET(10 MG) BY MOUTH AT BEDTIME, Disp: 30 tablet, Rfl: 1 .  omega-3 acid ethyl esters (LOVAZA) 1 g capsule, Take by mouth., Disp: , Rfl:  .  Omega-3 Fatty Acids (RA FISH OIL) 1000 MG CAPS, Take 1 capsule by mouth 2 (two) times daily., Disp: , Rfl:  .  tadalafil (CIALIS) 5 MG tablet, Take 1 tablet (5 mg total) by mouth daily., Disp: 30 tablet, Rfl: 3 .  telmisartan (MICARDIS) 80 MG tablet, TAKE 1 TABLET(80 MG) BY MOUTH DAILY, Disp: 30 tablet, Rfl: 1 .  zolpidem (AMBIEN) 10 MG tablet, Take 1 tablet (10 mg total) by mouth at bedtime as needed for sleep., Disp: 30 tablet, Rfl: 0  Allergies  Allergen Reactions  . Ace Inhibitors Cough  . Codeine Hives and Itching     ROS  Constitutional: Negative for fever or weight change.  Respiratory: Negative for cough and shortness of breath.   Cardiovascular: Negative for chest pain or palpitations.  Gastrointestinal: Negative for abdominal pain, no bowel changes.  Musculoskeletal: Negative for gait problem or joint swelling.  Skin:  Negative for rash.  Neurological: Negative for dizziness or headache.  No other specific complaints in a complete review of systems (except as listed in HPI above).  Objective  Vitals:   01/17/19 1356  BP: 124/86  Pulse: 73  Resp: 16  Temp: (!) 97.5 F (36.4 C)  TempSrc: Temporal  SpO2: 97%  Weight: 219 lb 9.6 oz (99.6 kg)  Height: 6' (1.829 m)    Body mass index is 29.78 kg/m.  Physical Exam  Constitutional: Patient appears well-developed and well-nourished. No distress.  HENT:  Head: Normocephalic and atraumatic. Ears: B TMs ok, no erythema or effusion; Nose: Nose normal. Mouth/Throat: Oropharynx is clear and moist. No oropharyngeal exudate.  Eyes: Conjunctivae and EOM are normal. Pupils are equal, round, and reactive to light. No scleral icterus.  Neck: Normal range of motion. Neck supple. No JVD present. No thyromegaly present.  Cardiovascular: Normal rate, regular rhythm and normal heart sounds.  No murmur heard. No BLE edema. Pulmonary/Chest: Effort normal and breath sounds normal. No respiratory distress. Abdominal: Soft. Bowel sounds are normal, no distension. There is no tenderness. no masses MALE GENITALIA: Normal descended testes bilaterally, no masses palpated, no hernias, no lesions, no discharge RECTAL: Not done  Musculoskeletal: Normal range of motion, no joint effusions. No gross deformities Neurological: he is alert and oriented to person, place, and time. No cranial nerve deficit. Coordination, balance, strength, speech and gait are normal.  Skin: Skin is warm and dry. No rash noted. No erythema.  Psychiatric: Patient has a normal mood and affect. behavior is normal. Judgment and thought content normal.  PHQ2/9: Depression screen [facility] 2/9 01/17/2019 05/18/2018 09/28/2017 05/14/2016 01/01/2016  Decreased Interest 0 0 0 0 0  Down, Depressed, Hopeless 0 0 1 0 0  PHQ - 2 Score 0 0 1 0 0  Altered sleeping 0 1 3 - -  Tired, decreased energy 0 2 2 - -  Change in appetite 0 1 0 - -  Feeling bad or failure about yourself  0 0 0 - -  Trouble concentrating 0 0 0 - -  Moving slowly or fidgety/restless 0 0 0 - -  Suicidal thoughts 0 0 0 - -  PHQ-9 Score 0 4 6 - -  Difficult doing work/chores - Somewhat difficult Somewhat difficult - -     Fall Risk: Fall Risk  01/17/2019 05/18/2018 09/28/2017 03/29/2017 05/14/2016  Falls in the past year? 0 0 No No No  Number falls in past yr: 0 0 - - -  Injury with Fall? 0 0 - - -     Functional Status Survey: Is the patient  deaf or have difficulty hearing?: No Does the patient have difficulty seeing, even when wearing glasses/contacts?: No Does the patient have difficulty concentrating, remembering, or making decisions?: No Does the patient have difficulty walking or climbing stairs?: No Does the patient have difficulty dressing or bathing?: No Does the patient have difficulty doing errands alone such as visiting a doctor's office or shopping?: No    Assessment & Plan  1. Well adult exam  - Lipid panel - Hemoglobin A1c - COMPLETE METABOLIC PANEL WITH GFR - CBC with Differential/Platelet  2. Need for immunization against influenza  - Flu Vaccine QUAD 36+ mos IM  3. History of prostate cancer   4. Dyslipidemia  - Lipid panel  5. Diabetes mellitus screening  - Hemoglobin A1c  6. Anemia, unspecified type  - CBC with Differential/Platelet  7. Family history of dementia  Discussed symptoms  of early dementia   8. GAD (generalized anxiety disorder)  - busPIRone (BUSPAR) 15 MG tablet; Take 1 tablet (15 mg total) by mouth 2 (two) times daily.  Dispense: 180 tablet; Refill: 1  9. Perennial allergic rhinitis with seasonal variation  - montelukast (SINGULAIR) 10 MG tablet; Take 1 tablet (10 mg total) by mouth at bedtime.  Dispense: 90 tablet; Refill: 1  10. Hypertension, benign  - telmisartan (MICARDIS) 80 MG tablet; Take 1 tablet (80 mg total) by mouth daily.  Dispense: 90 tablet; Refill: 1   -Prostate cancer screening and PSA options (with potential risks and benefits of testing vs not testing) were discussed along with recent recs/guidelines. -USPSTF grade A and B recommendations reviewed with patient; age-appropriate recommendations, preventive care, screening tests, etc discussed and encouraged; healthy living encouraged; see AVS for patient education given to patient -Discussed importance of 150 minutes of physical activity weekly, eat two servings of fish weekly, eat one serving of tree  nuts ( cashews, pistachios, pecans, almonds.Marland Kitchen) every other day, eat 6 servings of fruit/vegetables daily and drink plenty of water and avoid sweet beverages.

## 2019-01-18 LAB — COMPLETE METABOLIC PANEL WITH GFR
AG Ratio: 2 (calc) (ref 1.0–2.5)
ALT: 37 U/L (ref 9–46)
AST: 24 U/L (ref 10–35)
Albumin: 4.6 g/dL (ref 3.6–5.1)
Alkaline phosphatase (APISO): 55 U/L (ref 35–144)
BUN: 17 mg/dL (ref 7–25)
CO2: 30 mmol/L (ref 20–32)
Calcium: 9.7 mg/dL (ref 8.6–10.3)
Chloride: 101 mmol/L (ref 98–110)
Creat: 0.75 mg/dL (ref 0.70–1.33)
GFR, Est African American: 121 mL/min/{1.73_m2} (ref >=60)
GFR, Est Non African American: 105 mL/min/{1.73_m2} (ref >=60)
Globulin: 2.3 g/dL (calc) (ref 1.9–3.7)
Glucose, Bld: 83 mg/dL (ref 65–99)
Potassium: 4.3 mmol/L (ref 3.5–5.3)
Sodium: 138 mmol/L (ref 135–146)
Total Bilirubin: 0.6 mg/dL (ref 0.2–1.2)
Total Protein: 6.9 g/dL (ref 6.1–8.1)

## 2019-01-18 LAB — CBC WITH DIFFERENTIAL/PLATELET
Absolute Monocytes: 557 cells/uL (ref 200–950)
Basophils Absolute: 32 cells/uL (ref 0–200)
Basophils Relative: 0.5 %
Eosinophils Absolute: 77 cells/uL (ref 15–500)
Eosinophils Relative: 1.2 %
HCT: 45.8 % (ref 38.5–50.0)
Hemoglobin: 15 g/dL (ref 13.2–17.1)
Lymphs Abs: 1939 cells/uL (ref 850–3900)
MCH: 28.3 pg (ref 27.0–33.0)
MCHC: 32.8 g/dL (ref 32.0–36.0)
MCV: 86.4 fL (ref 80.0–100.0)
MPV: 10.8 fL (ref 7.5–12.5)
Monocytes Relative: 8.7 %
Neutro Abs: 3795 cells/uL (ref 1500–7800)
Neutrophils Relative %: 59.3 %
Platelets: 237 10*3/uL (ref 140–400)
RBC: 5.3 10*6/uL (ref 4.20–5.80)
RDW: 12.8 % (ref 11.0–15.0)
Total Lymphocyte: 30.3 %
WBC: 6.4 10*3/uL (ref 3.8–10.8)

## 2019-01-18 LAB — LIPID PANEL
Cholesterol: 126 mg/dL (ref <200)
HDL: 33 mg/dL — ABNORMAL LOW (ref >=40)
LDL Cholesterol (Calc): 66 mg/dL (calc)
Non-HDL Cholesterol (Calc): 93 mg/dL (calc) (ref <130)
Total CHOL/HDL Ratio: 3.8 (calc) (ref <5.0)
Triglycerides: 197 mg/dL — ABNORMAL HIGH (ref <150)

## 2019-01-18 LAB — HEMOGLOBIN A1C
Hgb A1c MFr Bld: 5.4 % of total Hgb (ref <5.7)
Mean Plasma Glucose: 108 (calc)
eAG (mmol/L): 6 (calc)

## 2019-02-03 ENCOUNTER — Other Ambulatory Visit: Payer: Self-pay | Admitting: Family Medicine

## 2019-02-03 DIAGNOSIS — F411 Generalized anxiety disorder: Secondary | ICD-10-CM

## 2019-03-22 ENCOUNTER — Other Ambulatory Visit: Payer: Self-pay

## 2019-03-22 DIAGNOSIS — Z20822 Contact with and (suspected) exposure to covid-19: Secondary | ICD-10-CM

## 2019-03-23 LAB — NOVEL CORONAVIRUS, NAA: SARS-CoV-2, NAA: NOT DETECTED

## 2019-04-16 ENCOUNTER — Other Ambulatory Visit: Payer: Self-pay | Admitting: Family Medicine

## 2019-05-01 ENCOUNTER — Ambulatory Visit: Payer: 59 | Attending: Internal Medicine

## 2019-05-01 DIAGNOSIS — Z20822 Contact with and (suspected) exposure to covid-19: Secondary | ICD-10-CM

## 2019-05-02 LAB — NOVEL CORONAVIRUS, NAA: SARS-CoV-2, NAA: NOT DETECTED

## 2019-05-04 ENCOUNTER — Telehealth: Payer: Self-pay

## 2019-05-04 NOTE — Telephone Encounter (Signed)
Copied from [place] [phone]. Topic: General - Other >> May 04, 2019 10:07 AM Gavin Scott D wrote: Reason for CRM: Pt called saying he had a Covid test Monday and [facility] and it came back Tuesday neg.  He is having sinus congestion and ear pain Left ear feels like it has fluid in it.  He wants to know if Dr. Ancil Boozer will call in an antibiotic for him.  He said she has done this in the past.  He uses Writer at [employer] and [employer].  CB#  [phone]

## 2019-05-05 ENCOUNTER — Encounter: Payer: Self-pay | Admitting: Family Medicine

## 2019-05-05 ENCOUNTER — Ambulatory Visit (INDEPENDENT_AMBULATORY_CARE_PROVIDER_SITE_OTHER): Payer: 59 | Admitting: Family Medicine

## 2019-05-05 ENCOUNTER — Other Ambulatory Visit: Payer: Self-pay

## 2019-05-05 VITALS — Ht 73.0 in | Wt 215.0 lb

## 2019-05-05 DIAGNOSIS — H9202 Otalgia, left ear: Secondary | ICD-10-CM

## 2019-05-05 DIAGNOSIS — J019 Acute sinusitis, unspecified: Secondary | ICD-10-CM

## 2019-05-05 DIAGNOSIS — J329 Chronic sinusitis, unspecified: Secondary | ICD-10-CM

## 2019-05-05 DIAGNOSIS — J310 Chronic rhinitis: Secondary | ICD-10-CM

## 2019-05-05 MED ORDER — AMOXICILLIN-POT CLAVULANATE 875-125 MG PO TABS: 1.0000 | ORAL_TABLET | Freq: Two times a day (BID) | ORAL | 0 refills | Status: DC

## 2019-05-05 NOTE — Progress Notes (Signed)
Name: Gavin Scott   MRN: [id number]    DOB: 1965/06/18   Date:05/05/2019       Progress Note  Subjective:    Chief Complaint  Chief Complaint  Patient presents with  . Ear Pain    left ear, onset yesterday    I connected with  Patriciaann Clan  on 05/05/19 at  2:40 PM EST by a video enabled telemedicine application and verified that I am speaking with the correct person using two identifiers.  I discussed the limitations of evaluation and management by telemedicine and the availability of in person appointments. The patient expressed understanding and agreed to proceed. Staff also discussed with the patient that there may be a patient responsible charge related to this service. Patient Location: home Provider Location: [facility] office Additional Individuals present: none  HPI Over the past week patient states he had some nasal drainage which is typical for him this time of year, he has history of nasal allergies and recurrent sinusitis at least 1-2 times a year he states that symptoms were fairly normal did not seem to develop into a sinus infection and he not have any fever sweats chills sore throat.  He was not on his allergy medications prior to the onset of symptoms.  Overall symptoms not bothersome until yesterday he had sudden onset left ear pain which was sharp and throbbing severity rated 8-9/10.  His pain has improved a little bit and is better today only 4/10.  About 2 days ago it felt like fluid was in his ear, he denies any tinnitus, change in his hearing, popping or clicking.  No ear canal pain, or external ear tenderness swelling redness no drainage. He did recently have a Covid test, Monday tested negative (4d ago) He went into the office and was exposed to [employer] positive employee on 1/13, tested 1/18  Patient does not have any history of recurrent ear infections tubes or complicated ENT history in the past.  He is not having any headache facial pain, fever chills sweats nausea vomiting  myalgias cough or chest congestion  Patient Active Problem List   Diagnosis Date Noted  . Rectal polyp   . Benign neoplasm of ascending colon   . GERD (gastroesophageal reflux disease) 08/19/2015  . Depression 01/04/2015  . Generalized anxiety disorder 12/18/2014  . Basal cell carcinoma 12/18/2014  . Benign essential HTN 12/18/2014  . Circadian rhythm disorder 12/18/2014  . History of prostate biopsy 12/18/2014  . Dyslipidemia 12/18/2014  . Obesity (BMI 30.0-34.9) 12/18/2014  . Perennial allergic rhinitis with seasonal variation 12/18/2014  . Ear drum perforation 12/18/2014  . Motion sickness 12/18/2014  . Engages in travel abroad 12/18/2014  . Dysmetabolic syndrome 99991111    Social History   Tobacco Use  . Smoking status: Never Smoker  . Smokeless tobacco: Former Systems developer    Types: Snuff, Chew  Substance Use Topics  . Alcohol use: Yes    Alcohol/week: 1.0 standard drinks    Types: 1 Cans of beer per week    Comment: occasionally     Current Outpatient Medications:  .  albuterol (PROVENTIL HFA;VENTOLIN HFA) 108 (90 Base) MCG/ACT inhaler, Inhale 2 puffs into the lungs daily., Disp: , Rfl: 1 .  Ascorbic Acid (VITAMIN C) 1000 MG tablet, Take 1 tablet by mouth., Disp: , Rfl:  .  aspirin (RA ASPIRIN EC) 81 MG EC tablet, Take 1 tablet (81 mg total) by mouth daily. Swallow whole., Disp: 30 tablet, Rfl: 12 .  atorvastatin (LIPITOR)  40 MG tablet, TAKE 1 TABLET(40 MG) BY MOUTH DAILY, Disp: 90 tablet, Rfl: 1 .  azelastine (ASTELIN) 0.1 % nasal spray, Place 1 spray into both nostrils 2 (two) times daily., Disp: 30 mL, Rfl: 5 .  busPIRone (BUSPAR) 15 MG tablet, TAKE 1 TABLET(15 MG) BY MOUTH TWICE DAILY, Disp: 60 tablet, Rfl: 0 .  fexofenadine (ALLEGRA ALLERGY) 180 MG tablet, Take by mouth., Disp: , Rfl:  .  meloxicam (MOBIC) 15 MG tablet, TAKE 1 TABLET(15 MG) BY MOUTH DAILY (Patient taking differently: daily as needed. ), Disp: 90 tablet, Rfl: 0 .  montelukast (SINGULAIR) 10 MG  tablet, Take 1 tablet (10 mg total) by mouth at bedtime., Disp: 90 tablet, Rfl: 1 .  Omega-3 Fatty Acids (RA FISH OIL) 1000 MG CAPS, Take 1 capsule by mouth daily. , Disp: , Rfl:  .  tadalafil (CIALIS) 5 MG tablet, Take 1 tablet (5 mg total) by mouth daily., Disp: 30 tablet, Rfl: 3 .  telmisartan (MICARDIS) 80 MG tablet, Take 1 tablet (80 mg total) by mouth daily., Disp: 90 tablet, Rfl: 1 .  zolpidem (AMBIEN) 10 MG tablet, Take 1 tablet (10 mg total) by mouth at bedtime as needed for sleep., Disp: 30 tablet, Rfl: 0  Allergies  Allergen Reactions  . Ace Inhibitors Cough  . Codeine Hives and Itching    I personally reviewed active problem list, medication list, allergies, family history, social history, health maintenance, notes from last encounter, lab results with the patient/caregiver today.  Review of Systems  Constitutional: Negative.   HENT: Negative.   Eyes: Negative.   Respiratory: Negative.   Cardiovascular: Negative.   Gastrointestinal: Negative.   Endocrine: Negative.   Genitourinary: Negative.   Musculoskeletal: Negative.   Skin: Negative.   Allergic/Immunologic: Negative.   Neurological: Negative.   Hematological: Negative.   Psychiatric/Behavioral: Negative.   All other systems reviewed and are negative.    Objective:   Virtual encounter, vitals limited, only able to obtain the following Today's Vitals   05/05/19 1407 05/05/19 1408  Weight: 215 lb (97.5 kg)   Height: 6\' 1"  (1.854 m)   PainSc:  4    Body mass index is 28.37 kg/m. Nursing Note and Vital Signs reviewed.  Physical Exam Vitals and nursing note reviewed.  Constitutional:      General: He is not in acute distress.    Appearance: Normal appearance. He is well-developed. He is not ill-appearing, toxic-appearing or diaphoretic.     Comments: Well appearing  HENT:     Head: Normocephalic and atraumatic.     Comments: States no ttp with palpation of sinuses No visible erythema or edema of  external ears, no ttp Pt states grossly normal hearing b/l    Right Ear: External ear normal.     Left Ear: External ear normal.     Nose: Nose normal.  Eyes:     General:        Right eye: No discharge.        Left eye: No discharge.     Conjunctiva/sclera: Conjunctivae normal.  Neck:     Trachea: No tracheal deviation.  Pulmonary:     Effort: Pulmonary effort is normal. No respiratory distress.     Breath sounds: No stridor.  Skin:    Coloration: Skin is not jaundiced or pale.     Findings: No rash.  Neurological:     Mental Status: He is alert.  Psychiatric:        Attention and Perception: Attention  normal.        Mood and Affect: Mood normal.        Speech: Speech normal.        Behavior: Behavior normal. Behavior is cooperative.     PE limited by telephone encounter  No results found for this or any previous visit (from the past 72 hour(s)).  Assessment and Plan:   1. Left ear pain Do suspect otalgia is most likely eustachian tube dysfunction secondary to nasal allergies/rhinosinusitis//URI virus Encouraged him to restart antihistamines, steroid nasal sprays, take decongestants as able -as long as discomfort is gradually improving he may have some sensation of fullness popping and clicking or sensation of fluid in his ear but highly unlikely and doubt that he has a acute otitis media at this time.  I did send in antibiotics for him to get an only start if he has more severe ear pain with fever or continued ear pain with signs of sinus infection which I went over very carefully with him explaining that acute bacterial sinusitis would be with acute worsening of sinus pain and congestion, tenderness to palpation of sinus cavities to cheeks and forehead and accompanied with fever or symptoms persisting beyond 10 to 12 days -currently his symptoms are mild, may be caused by allergies or virus but certainly no bacterial infection at this point in time  2. Rhinosinusitis See  above encouraged him to use antihistamine, Flonase or Nasonex nasal core, decongestants, symptoms may be viral or uncontrolled allergies which he states he has several times a year and usually 1-2 times year progresses to a sinus infection needing antibiotics. Also encouraged him to be very careful and continue to quarantine with possible exposure and I did offer retesting of Covid.  His exposure is likely very low risk but with URI symptoms cannot rule out that may be Covid and he should self isolate at home at least until 10 days after the start of his symptoms, with no fever for 24 hours and no worsening symptoms or respiratory symptoms developing before returning to work   -[employer] and when to present for emergency care or RTC including fever >101.40F, chest pain, shortness of breath, new/worsening/un-resolving symptoms,  reviewed with patient at time of visit. Follow up and care instructions discussed and provided in AVS. - I discussed the assessment and treatment plan with the patient. The patient was provided an opportunity to ask questions and all were answered. The patient agreed with the plan and demonstrated an understanding of the instructions.  I provided 15 minutes of non-face-to-face time during this encounter.  Delsa Grana, PA-C 05/05/19 3:21 PM

## 2019-05-05 NOTE — Patient Instructions (Signed)
Eustachian Tube Dysfunction ° °Eustachian tube dysfunction refers to a condition in which a blockage develops in the narrow passage that connects the middle ear to the back of the nose (eustachian tube). The eustachian tube regulates air pressure in the middle ear by letting air move between the ear and nose. It also helps to drain fluid from the middle ear space. °Eustachian tube dysfunction can affect one or both ears. When the eustachian tube does not function properly, air pressure, fluid, or both can build up in the middle ear. °What are the causes? °This condition occurs when the eustachian tube becomes blocked or cannot open normally. Common causes of this condition include: °· Ear infections. °· Colds and other infections that affect the nose, mouth, and throat (upper respiratory tract). °· Allergies. °· Irritation from cigarette smoke. °· Irritation from stomach acid coming up into the esophagus (gastroesophageal reflux). The esophagus is the tube that carries food from the mouth to the stomach. °· Sudden changes in air pressure, such as from descending in an airplane or scuba diving. °· Abnormal growths in the nose or throat, such as: °? Growths that line the nose (nasal polyps). °? Abnormal growth of cells (tumors). °? Enlarged tissue at the back of the throat (adenoids). °What increases the risk? °You are more likely to develop this condition if: °· You smoke. °· You are overweight. °· You are a child who has: °? Certain birth defects of the mouth, such as cleft palate. °? Large tonsils or adenoids. °What are the signs or symptoms? °Common symptoms of this condition include: °· A feeling of fullness in the ear. °· Ear pain. °· Clicking or popping noises in the ear. °· Ringing in the ear. °· Hearing loss. °· Loss of balance. °· Dizziness. °Symptoms may get worse when the air pressure around you changes, such as when you travel to an area of high elevation, fly on an airplane, or go scuba diving. °How is  this diagnosed? °This condition may be diagnosed based on: °· Your symptoms. °· A physical exam of your ears, nose, and throat. °· Tests, such as those that measure: °? The movement of your eardrum (tympanogram). °? Your hearing (audiometry). °How is this treated? °Treatment depends on the cause and severity of your condition. °· In mild cases, you may relieve your symptoms by moving air into your ears. This is called "popping the ears." °· In more severe cases, or if you have symptoms of fluid in your ears, treatment may include: °? Medicines to relieve congestion (decongestants). °? Medicines that treat allergies (antihistamines). °? Nasal sprays or ear drops that contain medicines that reduce swelling (steroids). °? A procedure to drain the fluid in your eardrum (myringotomy). In this procedure, a small tube is placed in the eardrum to: °§ Drain the fluid. °§ Restore the air in the middle ear space. °? A procedure to insert a balloon device through the nose to inflate the opening of the eustachian tube (balloon dilation). °Follow these instructions at home: °Lifestyle °· Do not do any of the following until your health care provider approves: °? Travel to high altitudes. °? Fly in airplanes. °? Work in a pressurized cabin or room. °? Scuba dive. °· Do not use any products that contain nicotine or tobacco, such as cigarettes and e-cigarettes. If you need help quitting, ask your health care provider. °· Keep your ears dry. Wear fitted earplugs during showering and bathing. Dry your ears completely after. °General instructions °· Take over-the-counter   and prescription medicines only as told by your health care provider. °· Use techniques to help pop your ears as recommended by your health care provider. These may include: °? Chewing gum. °? Yawning. °? Frequent, forceful swallowing. °? Closing your mouth, holding your nose closed, and gently blowing as if you are trying to blow air out of your nose. °· Keep all  follow-up visits as told by your health care provider. This is important. °Contact a health care provider if: °· Your symptoms do not go away after treatment. °· Your symptoms come back after treatment. °· You are unable to pop your ears. °· You have: °? A fever. °? Pain in your ear. °? Pain in your head or neck. °? Fluid draining from your ear. °· Your hearing suddenly changes. °· You become very dizzy. °· You lose your balance. °Summary °· Eustachian tube dysfunction refers to a condition in which a blockage develops in the eustachian tube. °· It can be caused by ear infections, allergies, inhaled irritants, or abnormal growths in the nose or throat. °· Symptoms include ear pain, hearing loss, or ringing in the ears. °· Mild cases are treated with maneuvers to unblock the ears, such as yawning or ear popping. °· Severe cases are treated with medicines. Surgery may also be done (rare). °This information is not intended to replace advice given to you by your health care provider. Make sure you discuss any questions you have with your health care provider. °Document Revised: 07/20/2017 Document Reviewed: 07/20/2017 °Elsevier Patient Education © 2020 [employer] ° °

## 2019-05-10 NOTE — Telephone Encounter (Signed)
Pt had an appt for this on 05/05/2019 with Delsa Grana

## 2019-05-11 ENCOUNTER — Encounter: Payer: Self-pay | Admitting: Family Medicine

## 2019-06-07 ENCOUNTER — Other Ambulatory Visit: Payer: Self-pay | Admitting: Family Medicine

## 2019-06-07 DIAGNOSIS — E785 Hyperlipidemia, unspecified: Secondary | ICD-10-CM

## 2019-07-12 ENCOUNTER — Other Ambulatory Visit: Payer: Self-pay | Admitting: Family Medicine

## 2019-07-12 NOTE — Telephone Encounter (Signed)
Requested Prescriptions  Pending Prescriptions Disp Refills  . meloxicam (MOBIC) 15 MG tablet [Pharmacy Med Name: MELOXICAM 15MG  TABLETS] 90 tablet 1    Sig: TAKE 1 TABLET(15 MG) BY MOUTH DAILY     Analgesics:  COX2 Inhibitors Passed - 07/12/2019  3:31 AM      Passed - HGB in normal range and within 360 days    Hemoglobin  Date Value Ref Range Status  01/17/2019 15.0 13.2 - 17.1 g/dL Final         Passed - Cr in normal range and within 360 days    Creat  Date Value Ref Range Status  01/17/2019 0.75 0.70 - 1.33 mg/dL Final    Comment:    For patients >59 years of age, the reference limit for Creatinine is approximately 13% higher for people identified as African-American. [name] - Patient is not pregnant      Passed - Valid encounter within last 12 months    Recent Outpatient Visits          2 months ago Left ear pain   [facility] Delsa Grana, PA-C   5 months ago Well adult exam   [facility] Steele Sizer, MD   1 year ago Hypertension, benign   [facility] Steele Sizer, MD   1 year ago Dyslipidemia   [facility] Steele Sizer, MD   2 years ago Dyslipidemia   [facility] Steele Sizer, MD

## 2019-07-16 ENCOUNTER — Other Ambulatory Visit: Payer: Self-pay | Admitting: Family Medicine

## 2019-07-16 DIAGNOSIS — F411 Generalized anxiety disorder: Secondary | ICD-10-CM

## 2019-07-16 DIAGNOSIS — J302 Other seasonal allergic rhinitis: Secondary | ICD-10-CM

## 2019-07-16 DIAGNOSIS — I10 Essential (primary) hypertension: Secondary | ICD-10-CM

## 2019-07-16 NOTE — Telephone Encounter (Signed)
Requested Prescriptions  Pending Prescriptions Disp Refills  . telmisartan (MICARDIS) 80 MG tablet [Pharmacy Med Name: TELMISARTAN 80MG  TABLETS] 90 tablet 0    Sig: TAKE 1 TABLET(80 MG) BY MOUTH DAILY     Cardiovascular:  Angiotensin Receptor Blockers Passed - 07/16/2019  3:30 AM      Passed - Cr in normal range and within 180 days    Creat  Date Value Ref Range Status  01/17/2019 0.75 0.70 - 1.33 mg/dL Final    Comment:    For patients >54 years of age, the reference limit for Creatinine is approximately 13% higher for people identified as African-American. .          Passed - K in normal range and within 180 days    Potassium  Date Value Ref Range Status  01/17/2019 4.3 3.5 - 5.3 mmol/L Final         Passed - Patient is not pregnant      Passed - Last BP in normal range    BP Readings from Last 1 Encounters:  01/17/19 124/86         Passed - Valid encounter within last 6 months    Recent Outpatient Visits          2 months ago Left ear pain   [facility] Delsa Grana, PA-C   6 months ago Well adult exam   [facility] Steele Sizer, MD   1 year ago Hypertension, benign   [facility] Steele Sizer, MD   1 year ago Dyslipidemia   [facility] Steele Sizer, MD   2 years ago Dyslipidemia   [facility], Drue Stager, MD             . montelukast (SINGULAIR) 10 MG tablet [Pharmacy Med Name: MONTELUKAST 10MG  TABLETS] 90 tablet 1    Sig: TAKE 1 TABLET(10 MG) BY MOUTH AT BEDTIME     Pulmonology:  Leukotriene Inhibitors Passed - 07/16/2019  3:30 AM      Passed - Valid encounter within last 12 months    Recent Outpatient Visits          2 months ago Left ear pain   [facility] Delsa Grana, PA-C   6 months ago Well adult exam   [facility] Steele Sizer, MD   1 year ago Hypertension, benign   [facility] Steele Sizer, MD   1 year ago Dyslipidemia   [facility] Steele Sizer, MD   2 years ago Dyslipidemia   [facility], Drue Stager, MD             . busPIRone (BUSPAR) 15 MG tablet [Pharmacy Med Name: BUSPIRONE 15MG  TABLETS] 180 tablet     Sig: TAKE 1 TABLET(15 MG) BY MOUTH TWICE DAILY     Psychiatry: Anxiolytics/Hypnotics - Non-controlled Passed - 07/16/2019  3:30 AM      Passed - Valid encounter within last 6 months    Recent Outpatient Visits          2 months ago Left ear pain   [facility] Delsa Grana, PA-C   6 months ago Well adult exam   [facility] Steele Sizer, MD   1 year ago Hypertension, benign   [facility] Steele Sizer, MD   1 year ago Dyslipidemia   [facility] Steele Sizer, MD  2 years ago Dyslipidemia   [facility] Steele Sizer, MD             Last in office visit was 01/17/2019.  Due for labs this month per protocol.

## 2019-08-13 ENCOUNTER — Other Ambulatory Visit: Payer: Self-pay | Admitting: Family Medicine

## 2019-08-13 DIAGNOSIS — F411 Generalized anxiety disorder: Secondary | ICD-10-CM

## 2019-08-13 NOTE — Telephone Encounter (Signed)
Requested Prescriptions  Pending Prescriptions Disp Refills  . busPIRone (BUSPAR) 15 MG tablet [Pharmacy Med Name: BUSPIRONE 15MG  TABLETS] 180 tablet 0    Sig: TAKE 1 TABLET(15 MG) BY MOUTH TWICE DAILY     Psychiatry: Anxiolytics/Hypnotics - Non-controlled Passed - 08/13/2019  3:31 AM      Passed - Valid encounter within last 6 months    Recent Outpatient Visits          3 months ago Left ear pain   [facility] Delsa Grana, PA-C   6 months ago Well adult exam   [facility] Steele Sizer, MD   1 year ago Hypertension, benign   [facility] Steele Sizer, MD   1 year ago Dyslipidemia   [facility] Steele Sizer, MD   2 years ago Dyslipidemia   [facility] Steele Sizer, MD

## 2019-08-18 ENCOUNTER — Encounter: Payer: Self-pay | Admitting: Family Medicine

## 2019-08-18 ENCOUNTER — Ambulatory Visit (INDEPENDENT_AMBULATORY_CARE_PROVIDER_SITE_OTHER): Payer: 59 | Admitting: Family Medicine

## 2019-08-18 VITALS — BP 120/80 | Ht 73.0 in | Wt 216.0 lb

## 2019-08-18 DIAGNOSIS — F411 Generalized anxiety disorder: Secondary | ICD-10-CM

## 2019-08-18 DIAGNOSIS — J3089 Other allergic rhinitis: Secondary | ICD-10-CM | POA: Diagnosis not present

## 2019-08-18 DIAGNOSIS — M19041 Primary osteoarthritis, right hand: Secondary | ICD-10-CM

## 2019-08-18 DIAGNOSIS — E785 Hyperlipidemia, unspecified: Secondary | ICD-10-CM | POA: Diagnosis not present

## 2019-08-18 DIAGNOSIS — J302 Other seasonal allergic rhinitis: Secondary | ICD-10-CM

## 2019-08-18 DIAGNOSIS — G4720 Circadian rhythm sleep disorder, unspecified type: Secondary | ICD-10-CM

## 2019-08-18 DIAGNOSIS — K219 Gastro-esophageal reflux disease without esophagitis: Secondary | ICD-10-CM

## 2019-08-18 DIAGNOSIS — N5231 Erectile dysfunction following radical prostatectomy: Secondary | ICD-10-CM

## 2019-08-18 DIAGNOSIS — M19042 Primary osteoarthritis, left hand: Secondary | ICD-10-CM

## 2019-08-18 DIAGNOSIS — Z8546 Personal history of malignant neoplasm of prostate: Secondary | ICD-10-CM

## 2019-08-18 DIAGNOSIS — I10 Essential (primary) hypertension: Secondary | ICD-10-CM

## 2019-08-18 MED ORDER — ATORVASTATIN CALCIUM 40 MG PO TABS: ORAL_TABLET | ORAL | 1 refills | Status: DC

## 2019-08-18 MED ORDER — ZOLPIDEM TARTRATE 10 MG PO TABS: 10.0000 mg | ORAL_TABLET | Freq: Every evening | ORAL | 0 refills | Status: DC | PRN

## 2019-08-18 MED ORDER — FLUTICASONE PROPIONATE 50 MCG/ACT NA SUSP: 2.0000 | Freq: Every day | NASAL | 1 refills | Status: DC

## 2019-08-18 MED ORDER — MELOXICAM 15 MG PO TABS: ORAL_TABLET | ORAL | 1 refills | Status: DC

## 2019-08-18 MED ORDER — BUSPIRONE HCL 15 MG PO TABS: ORAL_TABLET | ORAL | 1 refills | Status: DC

## 2019-08-18 MED ORDER — BLACK ELDERBERRY 50 MG/5ML PO SYRP: 5.0000 mL | ORAL_SOLUTION | Freq: Every day | ORAL | 0 refills | Status: AC

## 2019-08-18 MED ORDER — MONTELUKAST SODIUM 10 MG PO TABS: ORAL_TABLET | ORAL | 1 refills | Status: DC

## 2019-08-18 MED ORDER — TELMISARTAN 80 MG PO TABS: 80.0000 mg | ORAL_TABLET | Freq: Every day | ORAL | 1 refills | Status: DC

## 2019-08-18 NOTE — Progress Notes (Signed)
Name: Gavin Scott   MRN: [id number]    DOB: [birth year]   Date:08/18/2019       Progress Note  Subjective  Chief Complaint  Chief Complaint  Patient presents with  . Medication Refill    I connected with  Patriciaann Clan on 08/18/19 at  2:40 PM EDT by telephone and verified that I am speaking with the correct person using two identifiers.  I discussed the limitations, risks, security and privacy concerns of performing an evaluation and management service by telephone and the availability of in person appointments. Staff also discussed with the patient that there may be a patient responsible charge related to this service. Patient Location: at home  Provider Location: [facility]   HPI  HTN: taking medication, denies chest pain, dizziness  or palpitation, BP is at goal .Compliant with medication.  Low testosterone : it was in the 200's but up to mid 300's in 2019, he had a prostatectomy July 2020, seeing Urologist Dr. Lottie Rater in [place] and had labs done recently. He has lack of libido and having ED using pump and cialis to control symptoms  Hyperlipidemia: LDL was at goal, taking Lipitor daily and fish oil otc, more physically active since pandemic and we will recheck labs next visit   Insomnia: only takes medicationAmbienprn , mostly when he travels, but has been taking Melatonin.His rx expired   Depression/GAD:he recently lost his father 05/2018.Taking Buspar and is happy with results. Mother moved in with his sister, he is working from home, seems to be doing well with current regiment.   History of prostatectomy:10/2017  doing well, it was for prostate cancer. Denies incontinence   Hyperglycemia: he denies polyphagia, polydipsia or polyuria.A1C was at goal, eating healthy  GERD: he has noticed intermittent heartburn at night, he has an adjustable bed and since he raised the head of the bed he has been doing better, he occasionally takes tums when he eats something spicy ,  continue current regiment   Patient Active Problem List   Diagnosis Date Noted  . Rectal polyp   . Benign neoplasm of ascending colon   . GERD (gastroesophageal reflux disease) 08/19/2015  . Depression 01/04/2015  . Generalized anxiety disorder 12/18/2014  . Basal cell carcinoma 12/18/2014  . Benign essential HTN 12/18/2014  . Circadian rhythm disorder 12/18/2014  . History of prostate biopsy 12/18/2014  . Dyslipidemia 12/18/2014  . Obesity (BMI 30.0-34.9) 12/18/2014  . Perennial allergic rhinitis with seasonal variation 12/18/2014  . Ear drum perforation 12/18/2014  . Motion sickness 12/18/2014  . Engages in travel abroad 12/18/2014  . Dysmetabolic syndrome 99991111    Past Surgical History:  Procedure Laterality Date  . COLONOSCOPY WITH PROPOFOL N/A 09/29/2016   Procedure: COLONOSCOPY WITH PROPOFOL;  Surgeon: [name], MD;  Location: [facility] ENDOSCOPY;  Service: Endoscopy;  Laterality: N/A;  . PROSTATE BIOPSY     [facility]-Negative   . PROSTATECTOMY  10/17/2018  . PROSTATECTOMY  10/17/2018   Done at [facility]  . TOTAL SHOULDER REPLACEMENT Right    Outpatient     Family History  Problem Relation Age of Onset  . Hyperlipidemia Mother   . Alcohol abuse Father   . Stroke Father   . Hyperlipidemia Father   . Dementia Father     Social History   Socioeconomic History  . Marital status: Married    Spouse name: Not on file  . Number of children: 2  . Years of education: Not on file  . Highest education  level: Bachelor's degree (e.g., BA, AB, BS)  Occupational History  . Occupation: Customer service manager   Tobacco Use  . Smoking status: Never Smoker  . Smokeless tobacco: Former Systems developer    Types: Snuff, Chew  Substance and Sexual Activity  . Alcohol use: Yes    Alcohol/week: 1.0 standard drinks    Types: 1 Cans of beer per week    Comment: occasionally  . Drug use: No  . Sexual activity: Yes    Partners: Female  Other Topics Concern  . Not on file   Social History Narrative  . Not on file   Social Determinants of Health   Financial Resource Strain: Low Risk   . Difficulty of Paying Living Expenses: Not hard at all  Food Insecurity: No Food Insecurity  . Worried About Charity fundraiser in the Last Year: Never true  . Ran Out of Food in the Last Year: Never true  Transportation Needs: No Transportation Needs  . Lack of Transportation (Medical): No  . Lack of Transportation (Non-Medical): No  Physical Activity: Sufficiently Active  . Days of Exercise per Week: 7 days  . Minutes of Exercise per Session: 60 min  Stress: No Stress Concern Present  . Feeling of Stress : Not at all  Social Connections: Not Isolated  . Frequency of Communication with Friends and Family: More than three times a week  . Frequency of Social Gatherings with Friends and Family: More than three times a week  . Attends Religious Services: More than 4 times per year  . Active Member of Clubs or Organizations: Yes  . Attends Archivist Meetings: More than 4 times per year  . Marital Status: Married  Human resources officer Violence: Not At Risk  . Fear of Current or Ex-Partner: No  . Emotionally Abused: No  . Physically Abused: No  . Sexually Abused: No     Current Outpatient Medications:  .  Ascorbic Acid (VITAMIN C) 1000 MG tablet, Take 1 tablet by mouth., Disp: , Rfl:  .  aspirin (RA ASPIRIN EC) 81 MG EC tablet, Take 1 tablet (81 mg total) by mouth daily. Swallow whole., Disp: 30 tablet, Rfl: 12 .  atorvastatin (LIPITOR) 40 MG tablet, TAKE 1 TABLET(40 MG) BY MOUTH DAILY, Disp: 90 tablet, Rfl: 1 .  busPIRone (BUSPAR) 15 MG tablet, TAKE 1 TABLET(15 MG) BY MOUTH TWICE DAILY, Disp: 180 tablet, Rfl: 1 .  fexofenadine (ALLEGRA ALLERGY) 180 MG tablet, Take by mouth., Disp: , Rfl:  .  meloxicam (MOBIC) 15 MG tablet, TAKE 1 TABLET(15 MG) BY MOUTH DAILY, Disp: 90 tablet, Rfl: 1 .  montelukast (SINGULAIR) 10 MG tablet, TAKE 1 TABLET(10 MG) BY MOUTH AT  BEDTIME, Disp: 90 tablet, Rfl: 1 .  Omega-3 Fatty Acids (RA FISH OIL) 1000 MG CAPS, Take 1 capsule by mouth daily. , Disp: , Rfl:  .  tadalafil (CIALIS) 5 MG tablet, Take 1 tablet (5 mg total) by mouth daily., Disp: 30 tablet, Rfl: 3 .  telmisartan (MICARDIS) 80 MG tablet, Take 1 tablet (80 mg total) by mouth daily., Disp: 90 tablet, Rfl: 1 .  albuterol (PROVENTIL HFA;VENTOLIN HFA) 108 (90 Base) MCG/ACT inhaler, Inhale 2 puffs into the lungs daily., Disp: , Rfl: 1 .  Black Elderberry 50 MG/5ML SYRP, Take 5 mLs by mouth daily., Disp: 240 mL, Rfl: 0 .  fluticasone (FLONASE) 50 MCG/ACT nasal spray, Place 2 sprays into both nostrils daily., Disp: 48 g, Rfl: 1 .  tadalafil (CIALIS) 20 MG tablet,  Take 20 mg by mouth as needed., Disp: , Rfl:  .  zolpidem (AMBIEN) 10 MG tablet, Take 1 tablet (10 mg total) by mouth at bedtime as needed for sleep., Disp: 30 tablet, Rfl: 0  Allergies  Allergen Reactions  . Ace Inhibitors Cough  . Codeine Hives and Itching    I personally reviewed active problem list, medication list, allergies, family history, social history, health maintenance with the patient/caregiver today.   ROS  Constitutional: Negative for fever, positive for weight change, lost 10 lbs since last visit .  Respiratory: Negative for cough and shortness of breath.   Cardiovascular: Negative for chest pain or palpitations.  Gastrointestinal: Negative for abdominal pain, no bowel changes.  Musculoskeletal: Negative for gait problem or joint swelling.  Skin: Negative for rash.  Neurological: Negative for dizziness or headache.  No other specific complaints in a complete review of systems (except as listed in HPI above).  Objective  Virtual encounter, vitals obtained at home  Today's Vitals   08/18/19 1444  BP: 120/80  Weight: 216 lb (98 kg)  Height: 6\' 1"  (1.854 m)   Body mass index is 28.5 kg/m.  Physical Exam  Awake, alert and oriented  PHQ2/9: Depression screen [facility] 2/9  08/18/2019 05/05/2019 01/17/2019 05/18/2018 09/28/2017  Decreased Interest 0 0 0 0 0  Down, Depressed, Hopeless 0 0 0 0 1  PHQ - 2 Score 0 0 0 0 1  Altered sleeping 0 2 0 1 3  Tired, decreased energy 0 0 0 2 2  Change in appetite 0 0 0 1 0  Feeling bad or failure about yourself  0 0 0 0 0  Trouble concentrating 0 0 0 0 0  Moving slowly or fidgety/restless 0 0 0 0 0  Suicidal thoughts 0 0 0 0 0  PHQ-9 Score 0 2 0 4 6  Difficult doing work/chores - Not difficult at all - Somewhat difficult Somewhat difficult   PHQ-2/9 Result is negative.    Fall Risk: Fall Risk  08/18/2019 05/05/2019 01/17/2019 05/18/2018 09/28/2017  Falls in the past year? 0 0 0 0 No  Number falls in past yr: 0 0 0 0 -  Injury with Fall? 0 0 0 0 -     Assessment & Plan  1. Hypertension, benign  At goal  - telmisartan (MICARDIS) 80 MG tablet; Take 1 tablet (80 mg total) by mouth daily.  Dispense: 90 tablet; Refill: 1  2. GAD (generalized anxiety disorder)  - busPIRone (BUSPAR) 15 MG tablet; TAKE 1 TABLET(15 MG) BY MOUTH TWICE DAILY  Dispense: 180 tablet; Refill: 1  3. Perennial allergic rhinitis with seasonal variation  - montelukast (SINGULAIR) 10 MG tablet; TAKE 1 TABLET(10 MG) BY MOUTH AT BEDTIME  Dispense: 90 tablet; Refill: 1  4. Dyslipidemia  - atorvastatin (LIPITOR) 40 MG tablet; TAKE 1 TABLET(40 MG) BY MOUTH DAILY  Dispense: 90 tablet; Refill: 1  5. Circadian rhythm sleep disorder  - zolpidem (AMBIEN) 10 MG tablet; Take 1 tablet (10 mg total) by mouth at bedtime as needed for sleep.  Dispense: 30 tablet; Refill: 0  6. History of prostate cancer   7. Osteoarthritis of fingers of both hands  - meloxicam (MOBIC) 15 MG tablet; TAKE 1 TABLET(15 MG) BY MOUTH DAILY  Dispense: 90 tablet; Refill: 1  8. Erectile dysfunction after radical prostatectomy  Urologist is giving him medication  9. Perennial allergic rhinitis  - fluticasone (FLONASE) 50 MCG/ACT nasal spray; Place 2 sprays into both nostrils daily.  Dispense: 48 g; Refill: 1  10. GERD without esophagitis  Discussed GERD appropriate diet , he has raised the head of the bed  I discussed the assessment and treatment plan with the patient. The patient was provided an opportunity to ask questions and all were answered. The patient agreed with the plan and demonstrated an understanding of the instructions.   The patient was advised to call back or seek an in-person evaluation if the symptoms worsen or if the condition fails to improve as anticipated.  I provided 25  minutes of non-face-to-face time during this encounter.  Loistine Chance, MD

## 2019-09-14 ENCOUNTER — Other Ambulatory Visit: Payer: Self-pay | Admitting: Family Medicine

## 2019-09-14 DIAGNOSIS — G4720 Circadian rhythm sleep disorder, unspecified type: Secondary | ICD-10-CM

## 2019-10-20 ENCOUNTER — Other Ambulatory Visit: Payer: Self-pay | Admitting: Family Medicine

## 2019-10-20 DIAGNOSIS — G4720 Circadian rhythm sleep disorder, unspecified type: Secondary | ICD-10-CM

## 2019-10-20 NOTE — Telephone Encounter (Signed)
Requested medication (s) are due for refill today: yes  Requested medication (s) are on the active medication list: yes  Last refill:  09/18/2019  Future visit scheduled: no  Notes to clinic: this refill cannot be delegated   Requested Prescriptions  Pending Prescriptions Disp Refills   zolpidem (AMBIEN) 10 MG tablet [Pharmacy Med Name: Zolpidem Tartrate 10MG  TABS] 30 tablet     Sig: Take 1 tablet (10 mg total) by mouth at bedtime as needed for sleep.      Not Delegated - Psychiatry:  Anxiolytics/Hypnotics Failed - 10/20/2019 12:49 PM      Failed - This refill cannot be delegated      Failed - Urine Drug Screen completed in last 360 days.      Passed - Valid encounter within last 6 months    Recent Outpatient Visits           2 months ago Hypertension, benign   [facility] Steele Sizer, MD   5 months ago Left ear pain   [facility] Delsa Grana, PA-C   9 months ago Well adult exam   [facility] Steele Sizer, MD   1 year ago Hypertension, benign   [facility] Steele Sizer, MD   2 years ago Dyslipidemia   [facility] Steele Sizer, MD

## 2020-02-01 NOTE — Progress Notes (Signed)
Name: Gavin Scott   MRN: [id number]    DOB: [birth year]   Date:02/02/2020       Progress Note  Subjective  Chief Complaint  CPE  HPI  Patient presents for annual CPE and follow up  HTN: taking medication, denies chest pain, dizziness  or palpitation,BP is at goal. Taking medication . He has lost weight but denies dizziness   Low testosterone : it was in the 200's but up to mid 300's in 2019, he had a prostatectomy July 2020, seeing Urologist Dr. Lottie Rater in [facility] and had negative PSA, testosterone level stable.   Hyperlipidemia: LDL was at goal, taking Lipitor daily and fish oil otc, more physically active since pandemic and we will recheck labs today   Insomnia: only takes medicationAmbienprn , mostly when he used to travel, but not travelling because of pandemic. He states sometimes cannot fall back asleep when he gets up at night, worries about his  Mother, discussed love and kindness medication and melatonin 3 am   Depression/GAD:he recently lost his father 05/2018, now worries about his mother, lives in [place] with his sister now, but comes every 6 months to visit her.Taking Buspar and is happy with results.   History of prostatectomy:10/2017  doing well, it was for prostate cancer, but seeing urologist and last PSA in June 2021 was < 0.4 . Denies incontinence , using a pump for ED   Hyperglycemia: he denies polyphagia, polydipsia or polyuria., eating healthier . We will recheck A1C   GERD: he states since he lost weight and also raising the head of the bed at night, no heartburn lately.   Family history of dementia: mother, father had dementia. No memory loss for him. Parents started to have symptoms in their 41's. Neither one diagnosed with Alzheimer's   Paresthesia right foot: he states after prostatectomy he developed numbness of right foot , he got better but still has some paresthesia compared to left foot, he also feels like he walks differently. Explained no  treatment for that.   Diet: balanced diet  Exercise: doing well since April 2021, walking 7 miles per day. Doing well   Depression: phq 9 is negative Depression screen [facility] 2/9 02/02/2020 02/02/2020 08/18/2019 05/05/2019 01/17/2019  Decreased Interest 0 0 0 0 0  Down, Depressed, Hopeless 0 0 0 0 0  PHQ - 2 Score 0 0 0 0 0  Altered sleeping 0 - 0 2 0  Tired, decreased energy 0 - 0 0 0  Change in appetite 0 - 0 0 0  Feeling bad or failure about yourself  0 - 0 0 0  Trouble concentrating 0 - 0 0 0  Moving slowly or fidgety/restless 0 - 0 0 0  Suicidal thoughts 0 - 0 0 0  PHQ-9 Score 0 - 0 2 0  Difficult doing work/chores - - - Not difficult at all -    Hypertension:  BP Readings from Last 3 Encounters:  02/02/20 118/78  08/18/19 120/80  01/17/19 124/86    Obesity: Wt Readings from Last 3 Encounters:  02/02/20 198 lb 3.2 oz (89.9 kg)  08/18/19 216 lb (98 kg)  05/05/19 215 lb (97.5 kg)   BMI Readings from Last 3 Encounters:  02/02/20 26.15 kg/m  08/18/19 28.50 kg/m  05/05/19 28.37 kg/m     Lipids:  Lab Results  Component Value Date   CHOL 126 01/17/2019   CHOL 96 05/18/2018   CHOL 127 04/07/2017   Lab Results  Component Value Date  HDL 33 (L) 01/17/2019   HDL 26 (L) 05/18/2018   HDL 33 (L) 04/07/2017   Lab Results  Component Value Date   LDLCALC 66 01/17/2019   LDLCALC 41 05/18/2018   LDLCALC 69 04/07/2017   Lab Results  Component Value Date   TRIG 197 (H) 01/17/2019   TRIG 217 (H) 05/18/2018   TRIG 173 (H) 04/07/2017   Lab Results  Component Value Date   CHOLHDL 3.8 01/17/2019   CHOLHDL 3.7 05/18/2018   CHOLHDL 3.8 04/07/2017   No results found for: LDLDIRECT Glucose:  Glucose, Bld  Date Value Ref Range Status  01/17/2019 83 65 - 99 mg/dL Final    Comment:    .            Fasting reference interval .   05/18/2018 85 65 - 99 mg/dL Final    Comment:    .            Fasting reference interval .   04/07/2017 99 65 - 99 mg/dL Final     Comment:    .            Fasting reference interval .       Office Visit from 02/02/2020 in [facility]  AUDIT-C Score 0       Married STD testing and prevention (HIV/chl/gon/syphilis): Not interested  Hep C: done 05/2018   Skin cancer: Discussed monitoring for atypical lesions Colorectal cancer: repeat in 2028  Prostate cancer: under the care of Urologist at [facility], PSA done 09/15/2019 was < 0.4    Lung cancer:   Low Dose CT Chest recommended if Age 9-80 years, 30 pack-year currently smoking OR have quit w/in 15years. Patient does not qualify.   ECG:  03/30/2017  Vaccines:   Shingrix: 71-64 yo and ask insurance if covered when patient above 49 yo Flu:  educated and discussed with patient.  Advanced Care Planning: A voluntary discussion about advance care planning including the explanation and discussion of advance directives.  Discussed health care proxy and Living will, and the patient was able to identify a health care proxy as wife.  Patient does not have a living will at present time.   Patient Active Problem List   Diagnosis Date Noted  . Rectal polyp   . Benign neoplasm of ascending colon   . GERD (gastroesophageal reflux disease) 08/19/2015  . Depression 01/04/2015  . Generalized anxiety disorder 12/18/2014  . Basal cell carcinoma 12/18/2014  . Benign essential HTN 12/18/2014  . Circadian rhythm disorder 12/18/2014  . History of prostate biopsy 12/18/2014  . Dyslipidemia 12/18/2014  . Obesity (BMI 30.0-34.9) 12/18/2014  . Perennial allergic rhinitis with seasonal variation 12/18/2014  . Ear drum perforation 12/18/2014  . Motion sickness 12/18/2014  . Engages in travel abroad 12/18/2014  . Dysmetabolic syndrome 01/77/9390    Past Surgical History:  Procedure Laterality Date  . COLONOSCOPY WITH PROPOFOL N/A 09/29/2016   Procedure: COLONOSCOPY WITH PROPOFOL;  Surgeon: [name], MD;  Location: [facility] ENDOSCOPY;  Service: Endoscopy;   Laterality: N/A;  . PROSTATE BIOPSY     [facility]-Negative   . PROSTATECTOMY  10/17/2018  . PROSTATECTOMY  10/17/2018   Done at [facility]  . TOTAL SHOULDER REPLACEMENT Right    Outpatient     Family History  Problem Relation Age of Onset  . Hyperlipidemia Mother   . Alcohol abuse Father   . Stroke Father   . Hyperlipidemia Father   . Dementia Father  Social History   Socioeconomic History  . Marital status: Married    Spouse name: Not on file  . Number of children: 2  . Years of education: Not on file  . Highest education level: Bachelor's degree (e.g., BA, AB, BS)  Occupational History  . Occupation: Customer service manager   Tobacco Use  . Smoking status: Never Smoker  . Smokeless tobacco: Former Systems developer    Types: Snuff, Chew  Vaping Use  . Vaping Use: Never used  Substance and Sexual Activity  . Alcohol use: Yes    Alcohol/week: 1.0 standard drink    Types: 1 Cans of beer per week    Comment: occasionally  . Drug use: No  . Sexual activity: Yes    Partners: Female  Other Topics Concern  . Not on file  Social History Narrative  . Not on file   Social Determinants of Health   Financial Resource Strain: Low Risk   . Difficulty of Paying Living Expenses: Not hard at all  Food Insecurity: No Food Insecurity  . Worried About Charity fundraiser in the Last Year: Never true  . Ran Out of Food in the Last Year: Never true  Transportation Needs: No Transportation Needs  . Lack of Transportation (Medical): No  . Lack of Transportation (Non-Medical): No  Physical Activity: Sufficiently Active  . Days of Exercise per Week: 6 days  . Minutes of Exercise per Session: 80 min  Stress: Stress Concern Present  . Feeling of Stress : To some extent  Social Connections: Moderately Integrated  . Frequency of Communication with Friends and Family: More than three times a week  . Frequency of Social Gatherings with Friends and Family: More than three times a week  .  Attends Religious Services: More than 4 times per year  . Active Member of Clubs or Organizations: No  . Attends Archivist Meetings: Never  . Marital Status: Married  Human resources officer Violence: Not At Risk  . Fear of Current or Ex-Partner: No  . Emotionally Abused: No  . Physically Abused: No  . Sexually Abused: No     Current Outpatient Medications:  .  Ascorbic Acid (VITAMIN C) 1000 MG tablet, Take 1 tablet by mouth., Disp: , Rfl:  .  aspirin (RA ASPIRIN EC) 81 MG EC tablet, Take 1 tablet (81 mg total) by mouth daily. Swallow whole., Disp: 30 tablet, Rfl: 12 .  atorvastatin (LIPITOR) 40 MG tablet, TAKE 1 TABLET(40 MG) BY MOUTH DAILY, Disp: 90 tablet, Rfl: 1 .  Black Elderberry 50 MG/5ML SYRP, Take 5 mLs by mouth daily., Disp: 240 mL, Rfl: 0 .  busPIRone (BUSPAR) 15 MG tablet, TAKE 1 TABLET(15 MG) BY MOUTH TWICE DAILY, Disp: 180 tablet, Rfl: 1 .  fexofenadine (ALLEGRA ALLERGY) 180 MG tablet, Take by mouth., Disp: , Rfl:  .  fluticasone (FLONASE) 50 MCG/ACT nasal spray, Place 2 sprays into both nostrils daily., Disp: 48 g, Rfl: 1 .  meloxicam (MOBIC) 15 MG tablet, TAKE 1 TABLET(15 MG) BY MOUTH DAILY, Disp: 90 tablet, Rfl: 1 .  montelukast (SINGULAIR) 10 MG tablet, TAKE 1 TABLET(10 MG) BY MOUTH AT BEDTIME, Disp: 90 tablet, Rfl: 1 .  Omega-3 Fatty Acids (RA FISH OIL) 1000 MG CAPS, Take 1 capsule by mouth daily. , Disp: , Rfl:  .  tadalafil (CIALIS) 20 MG tablet, Take 20 mg by mouth as needed., Disp: , Rfl:  .  tadalafil (CIALIS) 5 MG tablet, Take 1 tablet (5 mg total) by  mouth daily., Disp: 30 tablet, Rfl: 3 .  telmisartan (MICARDIS) 80 MG tablet, Take 1 tablet (80 mg total) by mouth daily., Disp: 90 tablet, Rfl: 1 .  albuterol (PROVENTIL HFA;VENTOLIN HFA) 108 (90 Base) MCG/ACT inhaler, Inhale 2 puffs into the lungs daily. (Patient not taking: Reported on 02/02/2020), Disp: , Rfl: 1 .  zolpidem (AMBIEN) 10 MG tablet, Take 1 tablet (10 mg total) by mouth at bedtime as needed for  sleep., Disp: 30 tablet, Rfl: 0  Allergies  Allergen Reactions  . Ace Inhibitors Cough  . Codeine Hives and Itching     ROS  Constitutional: Negative for fever, positive  weight change.  Respiratory: Negative for cough and shortness of breath.   Cardiovascular: Negative for chest pain or palpitations.  Gastrointestinal: Negative for abdominal pain, no bowel changes.  Musculoskeletal: Negative for gait problem or joint swelling.  Skin: Negative for rash.  Neurological: Negative for dizziness or headache.  No other specific complaints in a complete review of systems (except as listed in HPI above).  Objective  Vitals:   02/02/20 1137  BP: 118/78  Pulse: 65  Resp: 18  Temp: 98 F (36.7 C)  TempSrc: Oral  SpO2: 100%  Weight: 198 lb 3.2 oz (89.9 kg)  Height: 6\' 1"  (1.854 m)    Body mass index is 26.15 kg/m.  Physical Exam  Constitutional: Patient appears well-developed and well-nourished. No distress.  HENT: Head: Normocephalic and atraumatic. Ears: B TMs ok, no erythema or effusion; Nose: Nose normal. Mouth/Throat: Oropharynx is clear and moist. No oropharyngeal exudate.  Eyes: Conjunctivae and EOM are normal. Pupils are equal, round, and reactive to light. No scleral icterus.  Neck: Normal range of motion. Neck supple. No JVD present. No thyromegaly present.  Cardiovascular: Normal rate, regular rhythm and normal heart sounds.  No murmur heard. No BLE edema. Pulmonary/Chest: Effort normal and breath sounds normal. No respiratory distress. Abdominal: Soft. Bowel sounds are normal, no distension. There is no tenderness. no masses MALE GENITALIA: sees Urologist  RECTAL: not done  Musculoskeletal: Normal range of motion, no joint effusions. No gross deformities Neurological: he is alert and oriented to person, place, and time. No cranial nerve deficit. Coordination, balance, strength, speech and gait are normal.  Skin: Skin is warm and dry. No rash noted. No erythema.   Psychiatric: Patient has a normal mood and affect. behavior is normal. Judgment and thought content normal.   Fall Risk: Fall Risk  02/02/2020 08/18/2019 05/05/2019 01/17/2019 05/18/2018  Falls in the past year? 0 0 0 0 0  Number falls in past yr: 0 0 0 0 0  Injury with Fall? 0 0 0 0 0    Functional Status Survey: Is the patient deaf or have difficulty hearing?: No Does the patient have difficulty seeing, even when wearing glasses/contacts?: No Does the patient have difficulty concentrating, remembering, or making decisions?: No Does the patient have difficulty walking or climbing stairs?: No Does the patient have difficulty dressing or bathing?: No Does the patient have difficulty doing errands alone such as visiting a doctor's office or shopping?: No    Assessment & Plan  1. Well adult exam  - Lipid panel - CBC with Differential/Platelet - COMPLETE METABOLIC PANEL WITH GFR - Hemoglobin A1c  2. Hypertension, benign  - CBC with Differential/Platelet - COMPLETE METABOLIC PANEL WITH GFR - telmisartan (MICARDIS) 80 MG tablet; Take 1 tablet (80 mg total) by mouth daily.  Dispense: 90 tablet; Refill: 1  3. GAD (generalized anxiety disorder)  -  busPIRone (BUSPAR) 15 MG tablet; TAKE 1 TABLET(15 MG) BY MOUTH TWICE DAILY  Dispense: 180 tablet; Refill: 1  4. Perennial allergic rhinitis with seasonal variation  - montelukast (SINGULAIR) 10 MG tablet; TAKE 1 TABLET(10 MG) BY MOUTH AT BEDTIME  Dispense: 90 tablet; Refill: 1  5. History of prostate cancer   6. Dyslipidemia  - Lipid panel - atorvastatin (LIPITOR) 40 MG tablet; TAKE 1 TABLET(40 MG) BY MOUTH DAILY  Dispense: 90 tablet; Refill: 1  7. Circadian rhythm disorder   8. Erectile dysfunction after radical prostatectomy  Using penile pump   9. GERD without esophagitis   10. Perennial allergic rhinitis  - fluticasone (FLONASE) 50 MCG/ACT nasal spray; Place 2 sprays into both nostrils daily.  Dispense: 48 g; Refill:  1  11. Hyperglycemia  - COMPLETE METABOLIC PANEL WITH GFR  12. Need for shingles vaccine  Out of stock ,he will return   13. Need for immunization against influenza  - Flu Vaccine QUAD 36+ mos IM  -Prostate cancer screening and PSA options (with potential risks and benefits of testing vs not testing) were discussed along with recent recs/guidelines. -USPSTF grade A and B recommendations reviewed with patient; age-appropriate recommendations, preventive care, screening tests, etc discussed and encouraged; healthy living encouraged; see AVS for patient education given to patient -Discussed importance of 150 minutes of physical activity weekly, eat two servings of fish weekly, eat one serving of tree nuts ( cashews, pistachios, pecans, almonds.Marland Kitchen) every other day, eat 6 servings of fruit/vegetables daily and drink plenty of water and avoid sweet beverages.

## 2020-02-02 ENCOUNTER — Ambulatory Visit (INDEPENDENT_AMBULATORY_CARE_PROVIDER_SITE_OTHER): Payer: 59 | Admitting: Family Medicine

## 2020-02-02 ENCOUNTER — Other Ambulatory Visit: Payer: Self-pay

## 2020-02-02 ENCOUNTER — Encounter: Payer: Self-pay | Admitting: Family Medicine

## 2020-02-02 VITALS — BP 118/78 | HR 65 | Temp 98.0°F | Resp 18 | Ht 73.0 in | Wt 198.2 lb

## 2020-02-02 DIAGNOSIS — I10 Essential (primary) hypertension: Secondary | ICD-10-CM

## 2020-02-02 DIAGNOSIS — Z23 Encounter for immunization: Secondary | ICD-10-CM

## 2020-02-02 DIAGNOSIS — R739 Hyperglycemia, unspecified: Secondary | ICD-10-CM

## 2020-02-02 DIAGNOSIS — E785 Hyperlipidemia, unspecified: Secondary | ICD-10-CM

## 2020-02-02 DIAGNOSIS — Z0000 Encounter for general adult medical examination without abnormal findings: Secondary | ICD-10-CM | POA: Diagnosis not present

## 2020-02-02 DIAGNOSIS — K219 Gastro-esophageal reflux disease without esophagitis: Secondary | ICD-10-CM

## 2020-02-02 DIAGNOSIS — F411 Generalized anxiety disorder: Secondary | ICD-10-CM

## 2020-02-02 DIAGNOSIS — J302 Other seasonal allergic rhinitis: Secondary | ICD-10-CM

## 2020-02-02 DIAGNOSIS — N5231 Erectile dysfunction following radical prostatectomy: Secondary | ICD-10-CM

## 2020-02-02 DIAGNOSIS — Z8546 Personal history of malignant neoplasm of prostate: Secondary | ICD-10-CM

## 2020-02-02 DIAGNOSIS — J3089 Other allergic rhinitis: Secondary | ICD-10-CM

## 2020-02-02 DIAGNOSIS — G4720 Circadian rhythm sleep disorder, unspecified type: Secondary | ICD-10-CM

## 2020-02-02 MED ORDER — BUSPIRONE HCL 15 MG PO TABS: ORAL_TABLET | ORAL | 1 refills | Status: DC

## 2020-02-02 MED ORDER — ATORVASTATIN CALCIUM 40 MG PO TABS: ORAL_TABLET | ORAL | 1 refills | Status: DC

## 2020-02-02 MED ORDER — TELMISARTAN 80 MG PO TABS: 80.0000 mg | ORAL_TABLET | Freq: Every day | ORAL | 1 refills | Status: DC

## 2020-02-02 MED ORDER — FLUTICASONE PROPIONATE 50 MCG/ACT NA SUSP: 2.0000 | Freq: Every day | NASAL | 1 refills | Status: DC

## 2020-02-02 MED ORDER — MONTELUKAST SODIUM 10 MG PO TABS: ORAL_TABLET | ORAL | 1 refills | Status: DC

## 2020-02-02 NOTE — Patient Instructions (Signed)
Preventive Care 41-54 Years Old, Male Preventive care refers to lifestyle choices and visits with your health care provider that can promote health and wellness. This includes:  A yearly physical exam. This is also called an annual well check.  Regular dental and eye exams.  Immunizations.  Screening for certain conditions.  Healthy lifestyle choices, such as eating a healthy diet, getting regular exercise, not using drugs or products that contain nicotine and tobacco, and limiting alcohol use. What can I expect for my preventive care visit? Physical exam Your health care provider will check:  Height and weight. These may be used to calculate body mass index (BMI), which is a measurement that tells if you are at a healthy weight.  Heart rate and blood pressure.  Your skin for abnormal spots. Counseling Your health care provider may ask you questions about:  Alcohol, tobacco, and drug use.  Emotional well-being.  Home and relationship well-being.  Sexual activity.  Eating habits.  Work and work Statistician. What immunizations do I need?  Influenza (flu) vaccine  This is recommended every year. Tetanus, diphtheria, and pertussis (Tdap) vaccine  You may need a Td booster every 10 years. Varicella (chickenpox) vaccine  You may need this vaccine if you have not already been vaccinated. Zoster (shingles) vaccine  You may need this after age 64. Measles, mumps, and rubella (MMR) vaccine  You may need at least one dose of MMR if you were born in 1957 or later. You may also need a second dose. Pneumococcal conjugate (PCV13) vaccine  You may need this if you have certain conditions and were not previously vaccinated. Pneumococcal polysaccharide (PPSV23) vaccine  You may need one or two doses if you smoke cigarettes or if you have certain conditions. Meningococcal conjugate (MenACWY) vaccine  You may need this if you have certain conditions. Hepatitis A  vaccine  You may need this if you have certain conditions or if you travel or work in places where you may be exposed to hepatitis A. Hepatitis B vaccine  You may need this if you have certain conditions or if you travel or work in places where you may be exposed to hepatitis B. Haemophilus influenzae type b (Hib) vaccine  You may need this if you have certain risk factors. Human papillomavirus (HPV) vaccine  If recommended by your health care provider, you may need three doses over 6 months. You may receive vaccines as individual doses or as more than one vaccine together in one shot (combination vaccines). Talk with your health care provider about the risks and benefits of combination vaccines. What tests do I need? Blood tests  Lipid and cholesterol levels. These may be checked every 5 years, or more frequently if you are over 60 years old.  Hepatitis C test.  Hepatitis B test. Screening  Lung cancer screening. You may have this screening every year starting at age 43 if you have a 30-pack-year history of smoking and currently smoke or have quit within the past 15 years.  Prostate cancer screening. Recommendations will vary depending on your family history and other risks.  Colorectal cancer screening. All adults should have this screening starting at age 72 and continuing until age 2. Your health care provider may recommend screening at age 14 if you are at increased risk. You will have tests every 1-10 years, depending on your results and the type of screening test.  Diabetes screening. This is done by checking your blood sugar (glucose) after you have not eaten  for a while (fasting). You may have this done every 1-3 years.  Sexually transmitted disease (STD) testing. Follow these instructions at home: Eating and drinking  Eat a diet that includes fresh fruits and vegetables, whole grains, lean protein, and low-fat dairy products.  Take vitamin and mineral supplements as  recommended by your health care provider.  Do not drink alcohol if your health care provider tells you not to drink.  If you drink alcohol: ? Limit how much you have to 0-2 drinks a day. ? Be aware of how much alcohol is in your drink. In the [place] one drink equals one 12 oz bottle of beer (355 mL), one 5 oz glass of wine (148 mL), or one 1 oz glass of hard liquor (44 mL). Lifestyle  Take daily care of your teeth and gums.  Stay active. Exercise for at least 30 minutes on 5 or more days each week.  Do not use any products that contain nicotine or tobacco, such as cigarettes, e-cigarettes, and chewing tobacco. If you need help quitting, ask your health care provider.  If you are sexually active, practice safe sex. Use a condom or other form of protection to prevent STIs (sexually transmitted infections).  Talk with your health care provider about taking a low-dose aspirin every day starting at age 53. What's next?  Go to your health care provider once a year for a well check visit.  Ask your health care provider how often you should have your eyes and teeth checked.  Stay up to date on all vaccines. This information is not intended to replace advice given to you by your health care provider. Make sure you discuss any questions you have with your health care provider. Document Revised: 03/24/2018 Document Reviewed: 03/24/2018 Elsevier Patient Education  2020 [employer].

## 2020-02-03 LAB — CBC WITH DIFFERENTIAL/PLATELET
Absolute Monocytes: 480 cells/uL (ref 200–950)
Basophils Absolute: 20 cells/uL (ref 0–200)
Basophils Relative: 0.4 %
Eosinophils Absolute: 50 cells/uL (ref 15–500)
Eosinophils Relative: 1 %
HCT: 46.3 % (ref 38.5–50.0)
Hemoglobin: 15.2 g/dL (ref 13.2–17.1)
Lymphs Abs: 1755 cells/uL (ref 850–3900)
MCH: 29.6 pg (ref 27.0–33.0)
MCHC: 32.8 g/dL (ref 32.0–36.0)
MCV: 90.1 fL (ref 80.0–100.0)
MPV: 10.2 fL (ref 7.5–12.5)
Monocytes Relative: 9.6 %
Neutro Abs: 2695 cells/uL (ref 1500–7800)
Neutrophils Relative %: 53.9 %
Platelets: 207 10*3/uL (ref 140–400)
RBC: 5.14 10*6/uL (ref 4.20–5.80)
RDW: 12.1 % (ref 11.0–15.0)
Total Lymphocyte: 35.1 %
WBC: 5 10*3/uL (ref 3.8–10.8)

## 2020-02-03 LAB — COMPLETE METABOLIC PANEL WITH GFR
AG Ratio: 2.4 (calc) (ref 1.0–2.5)
ALT: 24 U/L (ref 9–46)
AST: 21 U/L (ref 10–35)
Albumin: 4.7 g/dL (ref 3.6–5.1)
Alkaline phosphatase (APISO): 51 U/L (ref 35–144)
BUN: 21 mg/dL (ref 7–25)
CO2: 30 mmol/L (ref 20–32)
Calcium: 9.7 mg/dL (ref 8.6–10.3)
Chloride: 103 mmol/L (ref 98–110)
Creat: 0.75 mg/dL (ref 0.70–1.33)
GFR, Est African American: 121 mL/min/{1.73_m2} (ref >=60)
GFR, Est Non African American: 104 mL/min/{1.73_m2} (ref >=60)
Globulin: 2 g/dL (calc) (ref 1.9–3.7)
Glucose, Bld: 82 mg/dL (ref 65–99)
Potassium: 4.6 mmol/L (ref 3.5–5.3)
Sodium: 139 mmol/L (ref 135–146)
Total Bilirubin: 0.6 mg/dL (ref 0.2–1.2)
Total Protein: 6.7 g/dL (ref 6.1–8.1)

## 2020-02-03 LAB — LIPID PANEL
Cholesterol: 127 mg/dL (ref <200)
HDL: 37 mg/dL — ABNORMAL LOW (ref >=40)
LDL Cholesterol (Calc): 66 mg/dL (calc)
Non-HDL Cholesterol (Calc): 90 mg/dL (calc) (ref <130)
Total CHOL/HDL Ratio: 3.4 (calc) (ref <5.0)
Triglycerides: 158 mg/dL — ABNORMAL HIGH (ref <150)

## 2020-02-03 LAB — HEMOGLOBIN A1C
Hgb A1c MFr Bld: 5.3 % of total Hgb (ref <5.7)
Mean Plasma Glucose: 105 (calc)
eAG (mmol/L): 5.8 (calc)

## 2020-02-20 ENCOUNTER — Other Ambulatory Visit: Payer: Self-pay

## 2020-02-20 ENCOUNTER — Ambulatory Visit (INDEPENDENT_AMBULATORY_CARE_PROVIDER_SITE_OTHER): Payer: 59

## 2020-02-20 DIAGNOSIS — Z23 Encounter for immunization: Secondary | ICD-10-CM | POA: Diagnosis not present

## 2020-05-06 ENCOUNTER — Ambulatory Visit: Payer: 59

## 2020-05-15 ENCOUNTER — Other Ambulatory Visit: Payer: Self-pay

## 2020-05-15 ENCOUNTER — Ambulatory Visit: Payer: 59

## 2020-05-17 ENCOUNTER — Other Ambulatory Visit: Payer: Self-pay

## 2020-05-17 ENCOUNTER — Ambulatory Visit (INDEPENDENT_AMBULATORY_CARE_PROVIDER_SITE_OTHER): Payer: 59

## 2020-05-17 DIAGNOSIS — Z23 Encounter for immunization: Secondary | ICD-10-CM

## 2020-07-17 ENCOUNTER — Ambulatory Visit: Payer: 59 | Admitting: Dermatology

## 2020-07-17 ENCOUNTER — Other Ambulatory Visit: Payer: Self-pay

## 2020-07-17 DIAGNOSIS — L578 Other skin changes due to chronic exposure to nonionizing radiation: Secondary | ICD-10-CM

## 2020-07-17 DIAGNOSIS — D225 Melanocytic nevi of trunk: Secondary | ICD-10-CM | POA: Diagnosis not present

## 2020-07-17 DIAGNOSIS — Z1283 Encounter for screening for malignant neoplasm of skin: Secondary | ICD-10-CM | POA: Diagnosis not present

## 2020-07-17 DIAGNOSIS — D3610 Benign neoplasm of peripheral nerves and autonomic nervous system, unspecified: Secondary | ICD-10-CM

## 2020-07-17 DIAGNOSIS — Z85828 Personal history of other malignant neoplasm of skin: Secondary | ICD-10-CM | POA: Diagnosis not present

## 2020-07-17 DIAGNOSIS — L918 Other hypertrophic disorders of the skin: Secondary | ICD-10-CM

## 2020-07-17 DIAGNOSIS — L821 Other seborrheic keratosis: Secondary | ICD-10-CM

## 2020-07-17 DIAGNOSIS — D1800 Hemangioma unspecified site: Secondary | ICD-10-CM

## 2020-07-17 DIAGNOSIS — L719 Rosacea, unspecified: Secondary | ICD-10-CM

## 2020-07-17 DIAGNOSIS — D2239 Melanocytic nevi of other parts of face: Secondary | ICD-10-CM

## 2020-07-17 DIAGNOSIS — L814 Other melanin hyperpigmentation: Secondary | ICD-10-CM

## 2020-07-17 DIAGNOSIS — B078 Other viral warts: Secondary | ICD-10-CM

## 2020-07-17 DIAGNOSIS — D229 Melanocytic nevi, unspecified: Secondary | ICD-10-CM

## 2020-07-17 NOTE — Patient Instructions (Addendum)

## 2020-07-17 NOTE — Progress Notes (Signed)
Follow-Up Visit   Subjective  Gavin Scott is a 55 y.o. male who presents for the following: Skin Problem (Spot on nose. Won't heal. Dur: 2 months. ) and Annual Exam (Here for skin cancer screening. Hx of BCC. Right upper arm 2015.).  Was flaky, came to a pimple head, and it drained a few days ago, now healing.    The following portions of the chart were reviewed this encounter and updated as appropriate:      Review of Systems: No other skin or systemic complaints except as noted in HPI or Assessment and Plan.  Objective  Well appearing patient in no apparent distress; mood and affect are within normal limits.  A full examination was performed including scalp, head, eyes, ears, nose, lips, neck, chest, axillae, abdomen, back, buttocks, bilateral upper extremities, bilateral lower extremities, hands, feet, fingers, toes, fingernails, and toenails. All findings within normal limits unless otherwise noted below.  Objective  nose: 0.2cm Crusted pink papule, consistent with resolving inflammatory papule  Images    Objective  Right Upper Arm, back: Well healed scar with no evidence of recurrence.   Objective  left chest, right abdomen: 53mm brown papule left chest  5x85mm brown macule right abdomen  Objective  Left Axilla x4 (4): Fleshy, skin-colored pedunculated papules.    Objective  Right inferior nasal tip: 1.5 mm firm pink flesh papule  Objective  Right upper arm: 34mm soft flesh papule  Objective  Right Palmar Middle 2nd Finger: Tiny firm verrucous papules -- Discussed viral etiology and contagion.   Assessment & Plan  Rosacea nose  Soolantra samples given to apply QHS.  RTC 4 weeks if not resolved.   History of basal cell carcinoma (BCC) Right Upper Arm, back  Clear. Observe for recurrence. Call clinic for new or changing lesions.  Recommend regular skin exams, daily broad-spectrum spf 30+ sunscreen use, and photoprotection.     Nevus left chest,  right abdomen  Benign-appearing.  Observation.  Call clinic for new or changing lesions.  Recommend daily use of broad spectrum spf 30+ sunscreen to sun-exposed areas.    Skin tag (4) Left Axilla x4  Procedure: Skin tag removal Informed consent:  Discussed risks (permanent scarring, infection, pain, bleeding, bruising, redness, and recurrence of the lesion) and benefits of the procedure, as well as the alternatives.  He is aware that skin tags are benign lesions, and their removal is often not considered medically necessary.  Informed consent was obtained. Anesthesia: 2% lido/epi The area was prepared and draped in a standard fashion. Snip removal was performed.   Antibiotic ointment and a sterile dressing were applied.   The patient tolerated procedure well. The patient was instructed on post-op care.   Number of lesions removed:  4, lesions discarded  Fibrous papule of nose Right inferior nasal tip  Benign, observe.  Neurofibroma Right upper arm  Benign-appearing, observe.  Other viral warts Right Palmar Middle 2nd Finger  Observe. Patient deferred treatment today.   Lentigines - Scattered tan macules - Due to sun exposure - Benign-appering, observe - Recommend daily broad spectrum sunscreen SPF 30+ to sun-exposed areas, reapply every 2 hours as needed. - Call for any changes  Seborrheic Keratoses - Stuck-on, waxy, tan-brown papules and/or plaques  - Benign-appearing - Discussed benign etiology and prognosis. - Observe - Call for any changes  Melanocytic Nevi - Tan-brown and/or pink-flesh-colored symmetric macules and papules - Benign appearing on exam today - Observation - Call clinic for new or changing moles -  Recommend daily use of broad spectrum spf 30+ sunscreen to sun-exposed areas.   Hemangiomas - Red papules - Discussed benign nature - Observe - Call for any changes  Actinic Damage - Chronic condition, secondary to cumulative UV/sun exposure -  diffuse scaly erythematous macules with underlying dyspigmentation - Recommend daily broad spectrum sunscreen SPF 30+ to sun-exposed areas, reapply every 2 hours as needed.  - Staying in the shade or wearing long sleeves, sun glasses (UVA+UVB protection) and wide brim hats (4-inch brim around the entire circumference of the hat) are also recommended for sun protection.  - Call for new or changing lesions.  Skin cancer screening performed today, waist up.   Return in about 1 year (around 07/17/2021) for TBSE.   I, Emelia Salisbury, CMA, am acting as scribe for Brendolyn Patty, MD.  Documentation: I have reviewed the above documentation for accuracy and completeness, and I agree with the above.  Brendolyn Patty MD

## 2020-08-05 ENCOUNTER — Ambulatory Visit: Payer: 59 | Admitting: Family Medicine

## 2020-08-21 ENCOUNTER — Encounter: Payer: Self-pay | Admitting: Family Medicine

## 2020-08-21 ENCOUNTER — Other Ambulatory Visit: Payer: Self-pay

## 2020-08-21 ENCOUNTER — Ambulatory Visit (INDEPENDENT_AMBULATORY_CARE_PROVIDER_SITE_OTHER): Payer: 59 | Admitting: Family Medicine

## 2020-08-21 VITALS — BP 132/84 | HR 79 | Temp 98.2°F | Resp 16 | Ht 73.0 in | Wt 202.0 lb

## 2020-08-21 DIAGNOSIS — N5231 Erectile dysfunction following radical prostatectomy: Secondary | ICD-10-CM

## 2020-08-21 DIAGNOSIS — F411 Generalized anxiety disorder: Secondary | ICD-10-CM | POA: Diagnosis not present

## 2020-08-21 DIAGNOSIS — J302 Other seasonal allergic rhinitis: Secondary | ICD-10-CM

## 2020-08-21 DIAGNOSIS — I10 Essential (primary) hypertension: Secondary | ICD-10-CM

## 2020-08-21 DIAGNOSIS — J3089 Other allergic rhinitis: Secondary | ICD-10-CM | POA: Diagnosis not present

## 2020-08-21 DIAGNOSIS — M19041 Primary osteoarthritis, right hand: Secondary | ICD-10-CM

## 2020-08-21 DIAGNOSIS — Z8546 Personal history of malignant neoplasm of prostate: Secondary | ICD-10-CM | POA: Diagnosis not present

## 2020-08-21 DIAGNOSIS — K219 Gastro-esophageal reflux disease without esophagitis: Secondary | ICD-10-CM

## 2020-08-21 DIAGNOSIS — M19042 Primary osteoarthritis, left hand: Secondary | ICD-10-CM

## 2020-08-21 DIAGNOSIS — E785 Hyperlipidemia, unspecified: Secondary | ICD-10-CM

## 2020-08-21 MED ORDER — BUSPIRONE HCL 15 MG PO TABS: ORAL_TABLET | ORAL | 1 refills | Status: DC

## 2020-08-21 MED ORDER — TELMISARTAN 80 MG PO TABS: 80.0000 mg | ORAL_TABLET | Freq: Every day | ORAL | 1 refills | Status: DC

## 2020-08-21 MED ORDER — MELOXICAM 15 MG PO TABS
ORAL_TABLET | ORAL | 1 refills | Status: DC
Start: 2020-08-21 — End: 2021-07-18

## 2020-08-21 MED ORDER — ATORVASTATIN CALCIUM 40 MG PO TABS: ORAL_TABLET | ORAL | 1 refills | Status: DC

## 2020-08-21 NOTE — Progress Notes (Signed)
Name: Gavin Scott   MRN: [id number]    DOB: November 14, 1965   Date:08/21/2020       Progress Note  Subjective  Chief Complaint  Follow Up  HPI  HTN: taking medication, denies chest pain, dizziness  or palpitation,BP is at goal. Taking medication . He is eating healthier since no longer travelling all the time , eating healthier and also walking more often   Low testosterone : it was in the 200's but up to mid 300's in 2019, he had a prostatectomy July 2020, seeing Urologist Dr. Lottie Rater in [place]. Unchanged   Hyperlipidemia: LDL was at goal, taking Lipitor daily and fish oil otc, more physically active , walking 5-7 miles per day, eats healthy. Last LDL was 66   Insomnia: only takes medicationAmbienprn , mostly when he used to travel, but not travelling because of pandemic. No recent problems   Depression/GAD:he states mother is now living with his sister in [place], no longer as stressed. Taking Buspar and doing well at this time  History of prostatectomy:10/2017  doing well, it was for prostate cancer, but seeing urologist and last PSA was Dec 2021 and normal . Denies incontinence , he tried the pump, after that cialis 5 mg and cialis 20 mg prn but current only using injectable  Hyperglycemia: he denies polyphagia, polydipsia or polyuria., eating healthier .Last A1C down to 5.3 %, in the past as high as 6.1 %   GERD: he states since he lost weight and also raising the head of the bed at night, no heartburn lately. Not taking any medications   Family history of dementia: mother, father had dementia.  Parents started to have symptoms in their 61's. Neither one diagnosed with Alzheimer's . Discussed memory games.   Paresthesia right foot: he states after prostatectomy he developed numbness of right foot , he got better but still has some paresthesia compared to left foot, he states no longer having problems when walking .   Patient Active Problem List   Diagnosis Date Noted  .  Rectal polyp   . Benign neoplasm of ascending colon   . GERD (gastroesophageal reflux disease) 08/19/2015  . Generalized anxiety disorder 12/18/2014  . Basal cell carcinoma 12/18/2014  . Benign essential HTN 12/18/2014  . Circadian rhythm disorder 12/18/2014  . History of prostate biopsy 12/18/2014  . Dyslipidemia 12/18/2014  . Perennial allergic rhinitis with seasonal variation 12/18/2014  . Ear drum perforation 12/18/2014  . Motion sickness 12/18/2014  . Engages in travel abroad 12/18/2014  . Dysmetabolic syndrome 38/18/2993    Past Surgical History:  Procedure Laterality Date  . COLONOSCOPY WITH PROPOFOL N/A 09/29/2016   Procedure: COLONOSCOPY WITH PROPOFOL;  Surgeon: [name], MD;  Location: [facility] ENDOSCOPY;  Service: Endoscopy;  Laterality: N/A;  . PROSTATE BIOPSY     [facility]-Negative   . PROSTATECTOMY  10/17/2018  . PROSTATECTOMY  10/17/2018   Done at [facility]  . TOTAL SHOULDER REPLACEMENT Right    Outpatient     Family History  Problem Relation Age of Onset  . Hyperlipidemia Mother   . Alcohol abuse Father   . Stroke Father   . Hyperlipidemia Father   . Dementia Father     Social History   Tobacco Use  . Smoking status: Never Smoker  . Smokeless tobacco: Former Systems developer    Types: Snuff, Chew  . Tobacco comment: as a child   Substance Use Topics  . Alcohol use: Yes    Alcohol/week: 1.0 standard drink  Types: 1 Cans of beer per week    Comment: occasionally     Current Outpatient Medications:  .  Ascorbic Acid (VITAMIN C) 1000 MG tablet, Take 1 tablet by mouth., Disp: , Rfl:  .  atorvastatin (LIPITOR) 40 MG tablet, TAKE 1 TABLET(40 MG) BY MOUTH DAILY, Disp: 90 tablet, Rfl: 1 .  Black Elderberry 50 MG/5ML SYRP, Take 5 mLs by mouth daily., Disp: 240 mL, Rfl: 0 .  busPIRone (BUSPAR) 15 MG tablet, TAKE 1 TABLET(15 MG) BY MOUTH TWICE DAILY, Disp: 180 tablet, Rfl: 1 .  fexofenadine (ALLEGRA) 180 MG tablet, Take by mouth., Disp: , Rfl:  .  fluticasone  (FLONASE) 50 MCG/ACT nasal spray, Place 2 sprays into both nostrils daily., Disp: 48 g, Rfl: 1 .  meloxicam (MOBIC) 15 MG tablet, TAKE 1 TABLET(15 MG) BY MOUTH DAILY, Disp: 90 tablet, Rfl: 1 .  montelukast (SINGULAIR) 10 MG tablet, TAKE 1 TABLET(10 MG) BY MOUTH AT BEDTIME, Disp: 90 tablet, Rfl: 1 .  Omega-3 Fatty Acids (RA FISH OIL) 1000 MG CAPS, Take 1 capsule by mouth daily. , Disp: , Rfl:  .  tadalafil (CIALIS) 5 MG tablet, Take 1 tablet (5 mg total) by mouth daily., Disp: 30 tablet, Rfl: 3 .  telmisartan (MICARDIS) 80 MG tablet, Take 1 tablet (80 mg total) by mouth daily., Disp: 90 tablet, Rfl: 1 .  zolpidem (AMBIEN) 10 MG tablet, Take 1 tablet (10 mg total) by mouth at bedtime as needed for sleep., Disp: 30 tablet, Rfl: 0  Allergies  Allergen Reactions  . Ace Inhibitors Cough  . Codeine Hives and Itching    I personally reviewed active problem list, medication list, allergies, family history, social history, health maintenance with the patient/caregiver today.   ROS  Constitutional: Negative for fever or weight change.  Respiratory: Negative for cough and shortness of breath.   Cardiovascular: Negative for chest pain or palpitations.  Gastrointestinal: Negative for abdominal pain, no bowel changes.  Musculoskeletal: Negative for gait problem or joint swelling.  Skin: Negative for rash.  Neurological: Negative for dizziness or headache.  No other specific complaints in a complete review of systems (except as listed in HPI above).  Objective  Vitals:   08/21/20 1041  BP: 132/84  Pulse: 79  Resp: 16  Temp: 98.2 F (36.8 C)  TempSrc: Oral  SpO2: 99%  Weight: 202 lb (91.6 kg)  Height: 6\' 1"  (1.854 m)    Body mass index is 26.65 kg/m.  Physical Exam  Constitutional: Patient appears well-developed and well-nourished. Overweight.  No distress.  HEENT: head atraumatic, normocephalic, pupils equal and reactive to light,  neck supple Cardiovascular: Normal rate, regular  rhythm and normal heart sounds.  No murmur heard. No BLE edema. Pulmonary/Chest: Effort normal and breath sounds normal. No respiratory distress. Abdominal: Soft.  There is no tenderness. Psychiatric: Patient has a normal mood and affect. behavior is normal. Judgment and thought content normal.  PHQ2/9: Depression screen [facility] 2/9 08/21/2020 02/02/2020 02/02/2020 08/18/2019 05/05/2019  Decreased Interest 0 0 0 0 0  Down, Depressed, Hopeless 0 0 0 0 0  PHQ - 2 Score 0 0 0 0 0  Altered sleeping - 0 - 0 2  Tired, decreased energy - 0 - 0 0  Change in appetite - 0 - 0 0  Feeling bad or failure about yourself  - 0 - 0 0  Trouble concentrating - 0 - 0 0  Moving slowly or fidgety/restless - 0 - 0 0  Suicidal thoughts -  0 - 0 0  PHQ-9 Score - 0 - 0 2  Difficult doing work/chores - - - - Not difficult at all  Some recent data might be hidden    phq 9 is negative   Fall Risk: Fall Risk  08/21/2020 02/02/2020 08/18/2019 05/05/2019 01/17/2019  Falls in the past year? 0 0 0 0 0  Number falls in past yr: 0 0 0 0 0  Injury with Fall? 0 0 0 0 0     Functional Status Survey: Is the patient deaf or have difficulty hearing?: No Does the patient have difficulty seeing, even when wearing glasses/contacts?: No Does the patient have difficulty concentrating, remembering, or making decisions?: No Does the patient have difficulty walking or climbing stairs?: No Does the patient have difficulty dressing or bathing?: No Does the patient have difficulty doing errands alone such as visiting a doctor's office or shopping?: No    Assessment & Plan  1. Hypertension, benign  - telmisartan (MICARDIS) 80 MG tablet; Take 1 tablet (80 mg total) by mouth daily.  Dispense: 90 tablet; Refill: 1  2. History of prostate cancer   3. GAD (generalized anxiety disorder)  - busPIRone (BUSPAR) 15 MG tablet; TAKE 1 TABLET(15 MG) BY MOUTH TWICE DAILY  Dispense: 180 tablet; Refill: 1  4. Perennial allergic rhinitis with  seasonal variation   5. Dyslipidemia  - atorvastatin (LIPITOR) 40 MG tablet; TAKE 1 TABLET(40 MG) BY MOUTH DAILY  Dispense: 90 tablet; Refill: 1  6. Erectile dysfunction after radical prostatectomy   7.GERD   8. Perennial allergic rhinitis   9. Osteoarthritis of fingers of both hands  - meloxicam (MOBIC) 15 MG tablet; TAKE 1 TABLET(15 MG) BY MOUTH DAILY  Dispense: 90 tablet; Refill: 1

## 2020-10-03 ENCOUNTER — Ambulatory Visit (INDEPENDENT_AMBULATORY_CARE_PROVIDER_SITE_OTHER): Payer: 59 | Admitting: Unknown Physician Specialty

## 2020-10-03 ENCOUNTER — Other Ambulatory Visit: Payer: Self-pay

## 2020-10-03 VITALS — BP 112/78 | HR 69 | Temp 98.0°F | Resp 18 | Ht 73.0 in | Wt 203.0 lb

## 2020-10-03 DIAGNOSIS — M6740 Ganglion, unspecified site: Secondary | ICD-10-CM | POA: Diagnosis not present

## 2020-10-03 NOTE — Patient Instructions (Signed)
Thumb spica splint

## 2020-10-03 NOTE — Progress Notes (Addendum)
BP 112/78   Pulse 69   Temp 98 F (36.7 C) (Oral)   Resp 18   Ht 6\' 1"  (1.854 m)   Wt 203 lb (92.1 kg)   SpO2 99%   BMI 26.78 kg/m    Subjective:    Patient ID: Gavin Scott, male    DOB: 03-Jun-1965, 55 y.o.   MRN: [id number]  HPI: Gavin Scott is a 55 y.o. male  Chief Complaint  Patient presents with   Hand Pain    Developed a knot on right thumb for 6 weeks. Painful   Pt is here for a painful nodule of right thumb No change in ROM.  Admits to developing arthritis in that hand.    Relevant past medical, surgical, family and social history reviewed and updated as indicated. Interim medical history since our last visit reviewed. Allergies and medications reviewed and updated.  Review of Systems  Per HPI unless specifically indicated above     Objective:    BP 112/78   Pulse 69   Temp 98 F (36.7 C) (Oral)   Resp 18   Ht 6\' 1"  (1.854 m)   Wt 203 lb (92.1 kg)   SpO2 99%   BMI 26.78 kg/m   Wt Readings from Last 3 Encounters:  10/03/20 203 lb (92.1 kg)  08/21/20 202 lb (91.6 kg)  02/02/20 198 lb 3.2 oz (89.9 kg)    Physical Exam Constitutional:      General: He is not in acute distress.    Appearance: Normal appearance. He is well-developed.  HENT:     Head: Normocephalic and atraumatic.  Eyes:     General: Lids are normal. No scleral icterus.       Right eye: No discharge.        Left eye: No discharge.     Conjunctiva/sclera: Conjunctivae normal.  Cardiovascular:     Rate and Rhythm: Normal rate.  Pulmonary:     Effort: Pulmonary effort is normal.  Abdominal:     Palpations: There is no hepatomegaly or splenomegaly.  Musculoskeletal:        General: Normal range of motion.     Comments: Mobile pea sized nodule right thumb DIP joint.    Skin:    Coloration: Skin is not pale.     Findings: No rash.  Neurological:     Mental Status: He is alert and oriented to person, place, and time.  Psychiatric:        Behavior: Behavior normal.        Thought  Content: Thought content normal.        Judgment: Judgment normal.    Results for orders placed or performed in visit on 02/02/20  Lipid panel  Result Value Ref Range   Cholesterol 127 <200 mg/dL   HDL 37 (L) > OR = 40 mg/dL   Triglycerides 158 (H) <150 mg/dL   LDL Cholesterol (Calc) 66 mg/dL (calc)   Total CHOL/HDL Ratio 3.4 <5.0 (calc)   Non-HDL Cholesterol (Calc) 90 <130 mg/dL (calc)  CBC with Differential/Platelet  Result Value Ref Range   WBC 5.0 3.8 - 10.8 Thousand/uL   RBC 5.14 4.20 - 5.80 Million/uL   Hemoglobin 15.2 13.2 - 17.1 g/dL   HCT 46.3 38.5 - 50.0 %   MCV 90.1 80.0 - 100.0 fL   MCH 29.6 27.0 - 33.0 pg   MCHC 32.8 32.0 - 36.0 g/dL   RDW 12.1 11.0 - 15.0 %   Platelets 207 140 -  400 Thousand/uL   MPV 10.2 7.5 - 12.5 fL   Neutro Abs 2,695 1,500 - 7,800 cells/uL   Lymphs Abs 1,755 850 - 3,900 cells/uL   Absolute Monocytes 480 200 - 950 cells/uL   Eosinophils Absolute 50 15 - 500 cells/uL   Basophils Absolute 20 0 - 200 cells/uL   Neutrophils Relative % 53.9 %   Total Lymphocyte 35.1 %   Monocytes Relative 9.6 %   Eosinophils Relative 1.0 %   Basophils Relative 0.4 %  COMPLETE METABOLIC PANEL WITH GFR  Result Value Ref Range   Glucose, Bld 82 65 - 99 mg/dL   BUN 21 7 - 25 mg/dL   Creat 0.75 0.70 - 1.33 mg/dL   GFR, Est Non African American 104 > OR = 60 mL/min/1.71m2   GFR, Est African American 121 > OR = 60 mL/min/1.54m2   BUN/Creatinine Ratio NOT APPLICABLE 6 - 22 (calc)   Sodium 139 135 - 146 mmol/L   Potassium 4.6 3.5 - 5.3 mmol/L   Chloride 103 98 - 110 mmol/L   CO2 30 20 - 32 mmol/L   Calcium 9.7 8.6 - 10.3 mg/dL   Total Protein 6.7 6.1 - 8.1 g/dL   Albumin 4.7 3.6 - 5.1 g/dL   Globulin 2.0 1.9 - 3.7 g/dL (calc)   AG Ratio 2.4 1.0 - 2.5 (calc)   Total Bilirubin 0.6 0.2 - 1.2 mg/dL   Alkaline phosphatase (APISO) 51 35 - 144 U/L   AST 21 10 - 35 U/L   ALT 24 9 - 46 U/L  Hemoglobin A1c  Result Value Ref Range   Hgb A1c MFr Bld 5.3 <5.7 % of  total Hgb   Mean Plasma Glucose 105 (calc)   eAG (mmol/L) 5.8 (calc)      Assessment & Plan:   Problem List Items Addressed This Visit   None Visit Diagnoses     Ganglion cyst    -  Primary   Consistent with ganglion cyst.  Will refer to orthopedics.  Suggested thumb spica for pain   Relevant Orders   AMB referral to orthopedics        Follow up plan: No follow-ups on file.

## 2020-12-03 ENCOUNTER — Telehealth: Payer: Self-pay

## 2020-12-03 ENCOUNTER — Ambulatory Visit: Payer: 59 | Admitting: Family Medicine

## 2020-12-03 NOTE — Telephone Encounter (Signed)
Pt is having to be out of town and has has to cancel his appt but made another one for 12-23-2020 but he will be out of his medications the first week of Sept 2022. He asked if any way possible if we could go ahead and refill them and they need to go to [facility] .

## 2020-12-04 NOTE — Telephone Encounter (Signed)
Pt states his availability is tight and he is only available this Friday or August 29th / Pt states he will be out of medication the week of the 29th / please advise if he can come in any of those days or he will have to keep his appt for 9.12.22

## 2020-12-04 NOTE — Telephone Encounter (Signed)
Lvm for pt to call the office to schedule an appt  °

## 2020-12-04 NOTE — Telephone Encounter (Signed)
Please advise if we can work him in on Monday 12/09/20

## 2020-12-05 NOTE — Progress Notes (Signed)
Name: Gavin Scott   MRN: [id number]    DOB: 1966/03/06   Date:12/09/2020       Progress Note  Subjective  Chief Complaint  Medication Refill  HPI  HTN: taking medication, denies chest pain, dizziness  or palpitation, BP is at goal. Taking medication . He is eating healthier since no longer travelling all the time , eating healthier and also walking more often    Hyperlipidemia: LDL was at goal, taking Lipitor daily and fish oil otc, more physically active , walking 5-7 miles per day, eats healthy. Last LDL was 66     Insomnia: only takes medication Ambien prn , mostly when he used to travel, he is usually travelling nationally but still has medication at home.    Depression/GAD: he states mother is now living with his sister in [place], no longer as stressed. Taking Buspar and doing well at this time. Unchanged    History of prostatectomy:10/2017  doing well, it was for prostate cancer, but seeing urologist and last PSA was Dec 2021 and normal . Denies incontinence , he tried the pump, after that cialis 5 mg and cialis 20 mg prn but current only using injectable, Trimix   Hyperglycemia: he denies polyphagia, polydipsia or polyuria. , eating healthier .Last A1C down to 5.3 %, in the past as high as 6.1 %    GERD: doing well since he lost weight.    Family history of dementia: mother, father had dementia.  Parents started to have symptoms in their 96's. Neither one diagnosed with Alzheimer's . Discussed memory games again, another option is seeing neurologist or trying to find a research studies.   Dyslipidemia: he asked me about coronary calcium score. He has family history of heart disease in their sixties's . He denies chest pain, palpitation or decrease in exercise tolerance. Explained we can refer him to cardiologist but he is already He is on statin therapy   Spot on anterior chest: he noticed a small lesion on left anterior chest weeks ago, history of skin cancer . He sees Dermatologist  yearly   Patient Active Problem List   Diagnosis Date Noted   Rectal polyp    Benign neoplasm of ascending colon    GERD (gastroesophageal reflux disease) 08/19/2015   Generalized anxiety disorder 12/18/2014   Basal cell carcinoma 12/18/2014   Benign essential HTN 12/18/2014   Circadian rhythm disorder 12/18/2014   History of prostate biopsy 12/18/2014   Dyslipidemia 12/18/2014   Perennial allergic rhinitis with seasonal variation 12/18/2014   Ear drum perforation 12/18/2014   Motion sickness 12/18/2014   Engages in travel abroad 99991111   Dysmetabolic syndrome 99991111    Past Surgical History:  Procedure Laterality Date   COLONOSCOPY WITH PROPOFOL N/A 09/29/2016   Procedure: COLONOSCOPY WITH PROPOFOL;  Surgeon: [name], MD;  [facility] ENDOSCOPY;  Service: Endoscopy;  Laterality: N/A;   PROSTATE BIOPSY     [facility]-Negative    PROSTATECTOMY  10/17/2018   PROSTATECTOMY  10/17/2018   Done at [facility] [facility]    Outpatient     Family History  Problem Relation Age of Onset   Hyperlipidemia Mother    Alcohol abuse Father    Stroke Father    Hyperlipidemia Father    Dementia Father     Social History   Tobacco Use   Smoking status: Never   Smokeless tobacco: Former    Types: Snuff, Chew   Tobacco comments:    as  a child   Substance Use Topics   Alcohol use: Yes    Alcohol/week: 1.0 standard drink    Types: 1 Cans of beer per week    Comment: occasionally     Current Outpatient Medications:    Ascorbic Acid (VITAMIN C) 1000 MG tablet, Take 1 tablet by mouth., Disp: , Rfl:    atorvastatin (LIPITOR) 40 MG tablet, TAKE 1 TABLET(40 MG) BY MOUTH DAILY, Disp: 90 tablet, Rfl: 1   Black Elderberry 50 MG/5ML SYRP, Take 5 mLs by mouth daily., Disp: 240 mL, Rfl: 0   busPIRone (BUSPAR) 15 MG tablet, TAKE 1 TABLET(15 MG) BY MOUTH TWICE DAILY, Disp: 180 tablet, Rfl: 1   fexofenadine (ALLEGRA) 180 MG tablet, Take by mouth., Disp: ,  Rfl:    fluticasone (FLONASE) 50 MCG/ACT nasal spray, Place 2 sprays into both nostrils daily., Disp: 48 g, Rfl: 1   meloxicam (MOBIC) 15 MG tablet, TAKE 1 TABLET(15 MG) BY MOUTH DAILY, Disp: 90 tablet, Rfl: 1   montelukast (SINGULAIR) 10 MG tablet, TAKE 1 TABLET(10 MG) BY MOUTH AT BEDTIME, Disp: 90 tablet, Rfl: 1   Omega-3 Fatty Acids (RA FISH OIL) 1000 MG CAPS, Take 1 capsule by mouth daily. , Disp: , Rfl:    tadalafil (CIALIS) 5 MG tablet, Take 1 tablet (5 mg total) by mouth daily., Disp: 30 tablet, Rfl: 3   telmisartan (MICARDIS) 80 MG tablet, Take 1 tablet (80 mg total) by mouth daily., Disp: 90 tablet, Rfl: 1   zolpidem (AMBIEN) 10 MG tablet, Take 1 tablet (10 mg total) by mouth at bedtime as needed for sleep., Disp: 30 tablet, Rfl: 0  Allergies  Allergen Reactions   Ace Inhibitors Cough   Codeine Hives and Itching    I personally reviewed active problem list, medication list, allergies, family history, social history, health maintenance with the patient/caregiver today.   ROS  Constitutional: Negative for fever or weight change.  Respiratory: Negative for cough and shortness of breath.   Cardiovascular: Negative for chest pain or palpitations.  Gastrointestinal: Negative for abdominal pain, no bowel changes.  Musculoskeletal: Negative for gait problem or joint swelling.  Skin:rash on 2nd finger has a small bump since yesterday bit by an insect  Neurological: Negative for dizziness or headache.  No other specific complaints in a complete review of systems (except as listed in HPI above).   Objective  Vitals:   12/09/20 1301  BP: 120/84  Pulse: 80  Resp: 16  Temp: 98.1 F (36.7 C)  SpO2: 99%  Weight: 199 lb (90.3 kg)  Height: '6\' 1"'$  (1.854 m)    Body mass index is 26.25 kg/m.  Physical Exam  Constitutional: Patient appears well-developed and well-nourished. Overweight.  No distress.  HEENT: head atraumatic, normocephalic, pupils equal and reactive to light, neck  supple Cardiovascular: Normal rate, regular rhythm and normal heart sounds.  No murmur heard. No BLE edema. Skin: two small vesicles on left 2nd finger  Pulmonary/Chest: Effort normal and breath sounds normal. No respiratory distress. Abdominal: Soft.  There is no tenderness. Psychiatric: Patient has a normal mood and affect. behavior is normal. Judgment and thought content normal.    PHQ2/9: Depression screen [facility] 2/9 12/09/2020 10/03/2020 08/21/2020 02/02/2020 02/02/2020  Decreased Interest 0 0 0 0 0  Down, Depressed, Hopeless 0 0 0 0 0  PHQ - 2 Score 0 0 0 0 0  Altered sleeping - 0 - 0 -  Tired, decreased energy - 0 - 0 -  Change in appetite -  0 - 0 -  Feeling bad or failure about yourself  - 0 - 0 -  Trouble concentrating - 0 - 0 -  Moving slowly or fidgety/restless - 0 - 0 -  Suicidal thoughts - 0 - 0 -  PHQ-9 Score - 0 - 0 -  Difficult doing work/chores - Not difficult at all - - -  Some recent data might be hidden    phq 9 is negative   Fall Risk: Fall Risk  12/09/2020 10/03/2020 08/21/2020 02/02/2020 08/18/2019  Falls in the past year? 0 0 0 0 0  Number falls in past yr: 0 0 0 0 0  Injury with Fall? 0 0 0 0 0  Risk for fall due to : No Fall Risks - - - -  Follow up Falls prevention discussed Falls evaluation completed - - -     Functional Status Survey: Is the patient deaf or have difficulty hearing?: No Does the patient have difficulty seeing, even when wearing glasses/contacts?: No Does the patient have difficulty concentrating, remembering, or making decisions?: No Does the patient have difficulty walking or climbing stairs?: No Does the patient have difficulty dressing or bathing?: No Does the patient have difficulty doing errands alone such as visiting a doctor's office or shopping?: No    Assessment & Plan  1. Hypertension, benign  - telmisartan (MICARDIS) 80 MG tablet; Take 1 tablet (80 mg total) by mouth daily.  Dispense: 90 tablet; Refill: 1  2. Perennial  allergic rhinitis with seasonal variation  - montelukast (SINGULAIR) 10 MG tablet; TAKE 1 TABLET(10 MG) BY MOUTH AT BEDTIME  Dispense: 90 tablet; Refill: 1  3. GAD (generalized anxiety disorder)  - busPIRone (BUSPAR) 15 MG tablet; TAKE 1 TABLET(15 MG) BY MOUTH TWICE DAILY  Dispense: 180 tablet; Refill: 1  4. Need for immunization against influenza  - Flu Vaccine QUAD 49moIM (Fluarix, Fluzone & Alfiuria Quad PF)  5. Erectile dysfunction after radical prostatectomy   6. History of prostate cancer   7. Dyslipidemia  - Lipid panel - atorvastatin (LIPITOR) 40 MG tablet; TAKE 1 TABLET(40 MG) BY MOUTH DAILY  Dispense: 90 tablet; Refill: 1  8. Perennial allergic rhinitis  - fluticasone (FLONASE) 50 MCG/ACT nasal spray; Place 2 sprays into both nostrils daily.  Dispense: 48 g; Refill: 1  9. GERD without esophagitis   10. Hyperglycemia   11. Circadian rhythm sleep disorder  Taking Ambien prn  12. Family history of dementia   115 Family history of heart disease  - CRP High sensitivity  14. Long-term use of high-risk medication  - COMPLETE METABOLIC PANEL WITH GFR - CBC with Differential/Platelet

## 2020-12-09 ENCOUNTER — Ambulatory Visit: Payer: 59 | Admitting: Family Medicine

## 2020-12-09 ENCOUNTER — Ambulatory Visit (INDEPENDENT_AMBULATORY_CARE_PROVIDER_SITE_OTHER): Payer: 59 | Admitting: Family Medicine

## 2020-12-09 ENCOUNTER — Encounter: Payer: Self-pay | Admitting: Family Medicine

## 2020-12-09 ENCOUNTER — Other Ambulatory Visit: Payer: Self-pay

## 2020-12-09 VITALS — BP 120/84 | HR 80 | Temp 98.1°F | Resp 16 | Ht 73.0 in | Wt 199.0 lb

## 2020-12-09 DIAGNOSIS — Z8249 Family history of ischemic heart disease and other diseases of the circulatory system: Secondary | ICD-10-CM

## 2020-12-09 DIAGNOSIS — I10 Essential (primary) hypertension: Secondary | ICD-10-CM | POA: Diagnosis not present

## 2020-12-09 DIAGNOSIS — E785 Hyperlipidemia, unspecified: Secondary | ICD-10-CM

## 2020-12-09 DIAGNOSIS — Z23 Encounter for immunization: Secondary | ICD-10-CM | POA: Diagnosis not present

## 2020-12-09 DIAGNOSIS — Z79899 Other long term (current) drug therapy: Secondary | ICD-10-CM

## 2020-12-09 DIAGNOSIS — G4720 Circadian rhythm sleep disorder, unspecified type: Secondary | ICD-10-CM

## 2020-12-09 DIAGNOSIS — R739 Hyperglycemia, unspecified: Secondary | ICD-10-CM

## 2020-12-09 DIAGNOSIS — J3089 Other allergic rhinitis: Secondary | ICD-10-CM | POA: Diagnosis not present

## 2020-12-09 DIAGNOSIS — J302 Other seasonal allergic rhinitis: Secondary | ICD-10-CM

## 2020-12-09 DIAGNOSIS — Z818 Family history of other mental and behavioral disorders: Secondary | ICD-10-CM

## 2020-12-09 DIAGNOSIS — N5231 Erectile dysfunction following radical prostatectomy: Secondary | ICD-10-CM

## 2020-12-09 DIAGNOSIS — Z8546 Personal history of malignant neoplasm of prostate: Secondary | ICD-10-CM

## 2020-12-09 DIAGNOSIS — F411 Generalized anxiety disorder: Secondary | ICD-10-CM

## 2020-12-09 DIAGNOSIS — K219 Gastro-esophageal reflux disease without esophagitis: Secondary | ICD-10-CM

## 2020-12-09 MED ORDER — MONTELUKAST SODIUM 10 MG PO TABS: ORAL_TABLET | ORAL | 1 refills | Status: DC

## 2020-12-09 MED ORDER — TELMISARTAN 80 MG PO TABS: 80.0000 mg | ORAL_TABLET | Freq: Every day | ORAL | 1 refills | Status: DC

## 2020-12-09 MED ORDER — FLUTICASONE PROPIONATE 50 MCG/ACT NA SUSP: 2.0000 | Freq: Every day | NASAL | 1 refills | Status: DC

## 2020-12-09 MED ORDER — ATORVASTATIN CALCIUM 40 MG PO TABS: ORAL_TABLET | ORAL | 1 refills | Status: DC

## 2020-12-09 MED ORDER — BUSPIRONE HCL 15 MG PO TABS: ORAL_TABLET | ORAL | 1 refills | Status: DC

## 2020-12-09 NOTE — Patient Instructions (Signed)
Cognitive function or even a pre concussion battery of tests through at therapist or psychologist

## 2020-12-11 ENCOUNTER — Ambulatory Visit: Payer: 59 | Admitting: Family Medicine

## 2020-12-23 ENCOUNTER — Ambulatory Visit: Payer: 59 | Admitting: Family Medicine

## 2020-12-24 LAB — LIPID PANEL
Cholesterol: 123 mg/dL (ref <200)
HDL: 40 mg/dL (ref >=40)
LDL Cholesterol (Calc): 59 mg/dL (calc)
Non-HDL Cholesterol (Calc): 83 mg/dL (calc) (ref <130)
Total CHOL/HDL Ratio: 3.1 (calc) (ref <5.0)
Triglycerides: 153 mg/dL — ABNORMAL HIGH (ref <150)

## 2020-12-24 LAB — CBC WITH DIFFERENTIAL/PLATELET
Absolute Monocytes: 490 cells/uL (ref 200–950)
Basophils Absolute: 20 cells/uL (ref 0–200)
Basophils Relative: 0.4 %
Eosinophils Absolute: 49 cells/uL (ref 15–500)
Eosinophils Relative: 1 %
HCT: 47.9 % (ref 38.5–50.0)
Hemoglobin: 15.8 g/dL (ref 13.2–17.1)
Lymphs Abs: 1593 cells/uL (ref 850–3900)
MCH: 29.5 pg (ref 27.0–33.0)
MCHC: 33 g/dL (ref 32.0–36.0)
MCV: 89.5 fL (ref 80.0–100.0)
MPV: 10.4 fL (ref 7.5–12.5)
Monocytes Relative: 10 %
Neutro Abs: 2749 cells/uL (ref 1500–7800)
Neutrophils Relative %: 56.1 %
Platelets: 247 10*3/uL (ref 140–400)
RBC: 5.35 10*6/uL (ref 4.20–5.80)
RDW: 12.5 % (ref 11.0–15.0)
Total Lymphocyte: 32.5 %
WBC: 4.9 10*3/uL (ref 3.8–10.8)

## 2020-12-24 LAB — COMPLETE METABOLIC PANEL WITH GFR
AG Ratio: 2.1 (calc) (ref 1.0–2.5)
ALT: 31 U/L (ref 9–46)
AST: 20 U/L (ref 10–35)
Albumin: 4.5 g/dL (ref 3.6–5.1)
Alkaline phosphatase (APISO): 48 U/L (ref 35–144)
BUN: 16 mg/dL (ref 7–25)
CO2: 28 mmol/L (ref 20–32)
Calcium: 9.4 mg/dL (ref 8.6–10.3)
Chloride: 104 mmol/L (ref 98–110)
Creat: 0.73 mg/dL (ref 0.70–1.30)
Globulin: 2.1 g/dL (calc) (ref 1.9–3.7)
Glucose, Bld: 88 mg/dL (ref 65–99)
Potassium: 4.4 mmol/L (ref 3.5–5.3)
Sodium: 139 mmol/L (ref 135–146)
Total Bilirubin: 0.6 mg/dL (ref 0.2–1.2)
Total Protein: 6.6 g/dL (ref 6.1–8.1)
eGFR: 108 mL/min/{1.73_m2} (ref >=60)

## 2020-12-24 LAB — HIGH SENSITIVITY CRP: hs-CRP: 1.1 mg/L

## 2021-04-29 NOTE — Progress Notes (Signed)
Outpatient Oral COVID Treatment Note  I connected with Gavin Scott on 04/30/2021/8:08 AM by Deloris Ping and verified that I am speaking with the correct person using two identifiers.  I discussed the limitations, risks, security, and privacy concerns of performing an evaluation and management service by telephone and the availability of in person appointments. I also discussed with the patient that there may be a patient responsible charge related to this service. The patient expressed understanding and agreed to proceed.  Patient location: parking lot  Provider location: [facility]  Diagnosis: COVID-19 infection  Purpose of visit: Discussion of potential use of Molnupiravir or Paxlovid, a new treatment for mild to moderate COVID-19 viral infection in non-hospitalized patients.   Subjective: Patient is a 56 y.o. male who has been diagnosed with COVID 19 viral infection.  Their symptoms began on Sunday am, he woke up feeling congested, it is getting progressively worse, he has a cough, throat feels on fire, this morning mild SOB/feeling tired. Facial pressure . She had fever the past two days, also had chills a couple of days ago . He took Delsym cough syrup last night also taking Coricidin HBP and Tylenol .    Past Medical History:  Diagnosis Date   Allergy    Anxiety    Basal cell carcinoma 04/09/2014   Right upper arm   Circadian rhythm disorder    Hyperlipidemia    Hypertension    Metabolic syndrome    Obesity    Perforation of left tympanic membrane     Allergies  Allergen Reactions   Ace Inhibitors Cough   Codeine Hives and Itching     Current Outpatient Medications:    Ascorbic Acid (VITAMIN C) 1000 MG tablet, Take 1 tablet by mouth., Disp: , Rfl:    atorvastatin (LIPITOR) 40 MG tablet, TAKE 1 TABLET(40 MG) BY MOUTH DAILY, Disp: 90 tablet, Rfl: 1   benzonatate (TESSALON) 100 MG capsule, Take 1-2 capsules (100-200 mg total) by mouth 2 (two) times daily as needed., Disp: 40 capsule, Rfl:  0   Black Elderberry 50 MG/5ML SYRP, Take 5 mLs by mouth daily., Disp: 240 mL, Rfl: 0   busPIRone (BUSPAR) 15 MG tablet, TAKE 1 TABLET(15 MG) BY MOUTH TWICE DAILY, Disp: 180 tablet, Rfl: 1   chlorpheniramine-HYDROcodone (TUSSIONEX PENNKINETIC ER) 10-8 MG/5ML SUER, Take 5 mLs by mouth every 12 (twelve) hours as needed., Disp: 140 mL, Rfl: 0   fexofenadine (ALLEGRA) 180 MG tablet, Take by mouth., Disp: , Rfl:    fluticasone (FLONASE) 50 MCG/ACT nasal spray, Place 2 sprays into both nostrils daily., Disp: 48 g, Rfl: 1   meloxicam (MOBIC) 15 MG tablet, TAKE 1 TABLET(15 MG) BY MOUTH DAILY, Disp: 90 tablet, Rfl: 1   montelukast (SINGULAIR) 10 MG tablet, TAKE 1 TABLET(10 MG) BY MOUTH AT BEDTIME, Disp: 90 tablet, Rfl: 1   nirmatrelvir/ritonavir EUA (PAXLOVID) 20 x 150 MG & 10 x 100MG  TABS, Take 3 tablets by mouth 2 (two) times daily for 5 days. (Take nirmatrelvir 150 mg two tablets twice daily for 5 days and ritonavir 100 mg one tablet twice daily for 5 days) Patient GFR is 108, Disp: 30 tablet, Rfl: 0   Omega-3 Fatty Acids (RA FISH OIL) 1000 MG CAPS, Take 1 capsule by mouth daily. , Disp: , Rfl:    telmisartan (MICARDIS) 80 MG tablet, Take 1 tablet (80 mg total) by mouth daily., Disp: 90 tablet, Rfl: 1   zolpidem (AMBIEN) 10 MG tablet, Take 1 tablet (10 mg total) by mouth  at bedtime as needed for sleep., Disp: 30 tablet, Rfl: 0  Objective: Patient appears/sounds normal.  They are in no apparent distress.  Breathing is non labored.  Mood and behavior are normal.   Assessment: 56 y.o. male with mild/moderate COVID 19 viral infection diagnosed on 04/29/2021  at high risk for progression to severe COVID 19.  Plan:  This patient is a 56 y.o. male that meets the following criteria for Emergency Use Authorization of: Molnupiravir  Age >18 yr SARS-COV-2 positive test Symptom onset < 5 days Mild-to-moderate COVID disease with high risk for severe progression to hospitalization or death  I have spoken and  communicated the following to the patient or parent/caregiver regarding: Molnupiravir is an unapproved drug that is authorized for use under an Emergency Use Authorization.  There are no adequate, approved, available products for the treatment of COVID-19 in adults who have mild-to-moderate COVID-19 and are at high risk for progressing to severe COVID-19, including hospitalization or death. Other therapeutics are currently authorized. For additional information on all products authorized for treatment or prevention of COVID-19, please see [employer].  There are benefits and risks of taking this treatment as outlined in the Fact Sheet for Patients and Caregivers.  Fact Sheet for Patients and Caregivers was reviewed with patient. A hard copy will be provided to patient from pharmacy prior to the patient receiving treatment. Patients should continue to self-isolate and use infection control measures (e.g., wear mask, isolate, social distance, avoid sharing personal items, clean and disinfect high touch surfaces, and frequent handwashing) according to CDC guidelines.  The patient or parent/caregiver has the option to accept or refuse treatment. [place] has established a pregnancy surveillance program. Females of childbearing potential should use a reliable method of contraception correctly and consistently, as applicable, for the duration of treatment and for 4 days after the last dose of Molnupiravir. Males of reproductive potential who are sexually active with females of childbearing potential should use a reliable method of contraception correctly and consistently during treatment and for at least 3 months after the last dose. Pregnancy status and risk was assessed. Patient verbalized understanding of precautions.  After reviewing above information with the patient, the  patient agrees to receive Paxlovid.  Vitals:   04/30/21 0805  Pulse: 86  Resp: 16  SpO2: 95%     Follow up instructions:    Take prescription BID x 5 days as directed Reach out to pharmacist for counseling on medication if desired For concerns regarding further COVID symptoms please follow up with your PCP or urgent care For urgent or life-threatening issues, seek care at your local emergency department  The patient was provided an opportunity to ask questions, and all were answered. The patient agreed with the plan and demonstrated an understanding of the instructions.   Script sent to [facility]  and opted to pick up RX.  Also advised zinc, vitamin C, sending Tussionex and Tessalon perles to his pharmacy to control cough, try throat coat tea for sore throat   The patient was advised to call their PCP or seek an in-person evaluation if the symptoms worsen or if the condition fails to improve as anticipated.   I provided 15 minutes of face-to-face video visit time during this encounter, and > 50% was spent counseling as documented under my assessment & plan.  Loistine Chance, MD 04/30/2021 Darleen Crocker AM

## 2021-04-30 ENCOUNTER — Telehealth (INDEPENDENT_AMBULATORY_CARE_PROVIDER_SITE_OTHER): Payer: 59 | Admitting: Family Medicine

## 2021-04-30 ENCOUNTER — Encounter: Payer: Self-pay | Admitting: Family Medicine

## 2021-04-30 VITALS — HR 86 | Resp 16

## 2021-04-30 DIAGNOSIS — U071 COVID-19: Secondary | ICD-10-CM | POA: Diagnosis not present

## 2021-04-30 MED ORDER — BENZONATATE 100 MG PO CAPS: 100.0000 mg | ORAL_CAPSULE | Freq: Two times a day (BID) | ORAL | 0 refills | Status: DC | PRN

## 2021-04-30 MED ORDER — NIRMATRELVIR/RITONAVIR (PAXLOVID)TABLET: 3.0000 | ORAL_TABLET | Freq: Two times a day (BID) | ORAL | 0 refills | Status: AC

## 2021-04-30 MED ORDER — HYDROCOD POLST-CPM POLST ER 10-8 MG/5ML PO SUER: 5.0000 mL | Freq: Two times a day (BID) | ORAL | 0 refills | Status: DC | PRN

## 2021-05-15 NOTE — Progress Notes (Signed)
Name: Gavin Scott   MRN: [id number]    DOB: 1965-11-13   Date:05/16/2021       Progress Note  Subjective  Chief Complaint  Annual Exam  HPI  Patient presents for annual CPE and follow up - discussed possible additional cost with patient   HTN: BP is low today but usually around 120's at home and no dizziness, continue current medication Taking medication . He is eating healthier since no longer travelling all the time , eating healthier and also walking more often    Hyperlipidemia: LDL was at goal, taking Lipitor daily and fish oil otc, more physically active , walking 5-7 miles per day, eats healthy. Last LDL was 66     Insomnia: only takes medication Ambien prn , mostly when he used to travel, he is usually travelling nationally but still has medication at home. Unchanged   Depression/GAD: he states mother is now living with his sister in [place], no longer as stressed. Taking Buspar and doing well at this time. Work is stable, he had COVID-19 recently and is feeling tired but otherwise doing well.    History of prostatectomy:10/2017  doing well, it was for prostate cancer, but seeing urologist and last PSA was Dec 2021 and normal . Denies incontinence , he tried the pump, after that cialis 5 mg and cialis 20 mg prn but current only using injectable, Trimix but states ED is doing better and may be able to come off medication    Hyperglycemia: he denies polyphagia, polydipsia or polyuria. , eating healthier .Last A1C down to 5.3 %, in the past as high as 6.1 % . We will recheck labs    GERD: doing well since he lost weight, he has gained 10 lbs since last visit but he states around 205 lbs he feels good.    Family history of dementia: mother, father had dementia.  Parents started to have symptoms in their 35's. Neither one diagnosed with Alzheimer's . Discussed memory games again, another option is seeing neurologist or trying to find a research studies. He states he is thinking about what to  do   Dyslipidemia: He has family history of heart disease in their sixties's . He denies chest pain, palpitation or decrease in exercise tolerance.   IPSS Questionnaire (AUA-7): Over the past month   1)  How often have you had a sensation of not emptying your bladder completely after you finish urinating?  0 - Not at all  2)  How often have you had to urinate again less than two hours after you finished urinating? 3 - About half the time  3)  How often have you found you stopped and started again several times when you urinated?  0 - Not at all  4) How difficult have you found it to postpone urination?  0 - Not at all  5) How often have you had a weak urinary stream?  1 - Less than 1 time in 5  6) How often have you had to push or strain to begin urination?  0 - Not at all  7) How many times did you most typically get up to urinate from the time you went to bed until the time you got up in the morning?  2 - 2 times  Total score:  0-7 mildly symptomatic   8-19 moderately symptomatic   20-35 severely symptomatic    Seeing Urologist and is stable  Diet: balanced diet  Exercise: continue regular activity  Depression: phq 9 is negative Depression screen [facility] 2/9 05/16/2021 04/30/2021 12/09/2020 10/03/2020 08/21/2020  Decreased Interest 0 0 0 0 0  Down, Depressed, Hopeless 0 0 0 0 0  PHQ - 2 Score 0 0 0 0 0  Altered sleeping 0 0 - 0 -  Tired, decreased energy 0 0 - 0 -  Change in appetite 0 0 - 0 -  Feeling bad or failure about yourself  0 0 - 0 -  Trouble concentrating 0 0 - 0 -  Moving slowly or fidgety/restless 0 0 - 0 -  Suicidal thoughts 0 0 - 0 -  PHQ-9 Score 0 0 - 0 -  Difficult doing work/chores Not difficult at all - - Not difficult at all -  Some recent data might be hidden    Hypertension:  BP Readings from Last 3 Encounters:  05/16/21 108/66  12/09/20 120/84  10/03/20 112/78    Obesity: Wt Readings from Last 3 Encounters:  05/16/21 209 lb 12.8 oz (95.2 kg)  12/09/20  199 lb (90.3 kg)  10/03/20 203 lb (92.1 kg)   BMI Readings from Last 3 Encounters:  05/16/21 27.68 kg/m  12/09/20 26.25 kg/m  10/03/20 26.78 kg/m     Lipids:  Lab Results  Component Value Date   CHOL 123 12/23/2020   CHOL 127 02/02/2020   CHOL 126 01/17/2019   Lab Results  Component Value Date   HDL 40 12/23/2020   HDL 37 (L) 02/02/2020   HDL 33 (L) 01/17/2019   Lab Results  Component Value Date   LDLCALC 59 12/23/2020   LDLCALC 66 02/02/2020   LDLCALC 66 01/17/2019   Lab Results  Component Value Date   TRIG 153 (H) 12/23/2020   TRIG 158 (H) 02/02/2020   TRIG 197 (H) 01/17/2019   Lab Results  Component Value Date   CHOLHDL 3.1 12/23/2020   CHOLHDL 3.4 02/02/2020   CHOLHDL 3.8 01/17/2019   No results found for: LDLDIRECT Glucose:  Glucose, Bld  Date Value Ref Range Status  12/23/2020 88 65 - 99 mg/dL Final    Comment:    .            Fasting reference interval .   02/02/2020 82 65 - 99 mg/dL Final    Comment:    .            Fasting reference interval .   01/17/2019 83 65 - 99 mg/dL Final    Comment:    .            Fasting reference interval .     [facility] Visit from 05/16/2021 in [facility]  AUDIT-C Score 1       Married STD testing and prevention (HIV/chl/gon/syphilis):  not applicable Hep C Screening: up to date  Skin cancer: Discussed monitoring for atypical lesions Colorectal cancer: 09/29/16  Prostate cancer:   Lab Results  Component Value Date   PSA 5.5 (H) 05/18/2018   PSA 2.3 12/26/2014   PSA 1.8 07/07/2013     Lung cancer:  Low Dose CT Chest recommended if Age 73-80 years, 30 pack-year currently smoking OR have quit w/in 15years. Patient  not applicable AAA: The USPSTF recommends one-time screening with ultrasonography in men ages 30 to 42 years who have ever smoked. Patient:  not applicable  ECG:  35/36/14  Vaccines:    Tdap: due today  Shingrix: up to date  Pneumonia: N/A Flu:  up to date  COVID-19:discussed  bivalent vaccine in 3 months   Advanced Care Planning: A voluntary discussion about advance care planning including the explanation and discussion of advance directives.  Discussed health care proxy and Living will, and the patient was able to identify a health care proxy as wife .  Patient does have a living will and power attorney of health care   Patient Active Problem List   Diagnosis Date Noted   Hydrocele in adult 05/16/2021   Rectal polyp    Benign neoplasm of ascending colon    GERD (gastroesophageal reflux disease) 08/19/2015   Generalized anxiety disorder 12/18/2014   Basal cell carcinoma 12/18/2014   Benign essential HTN 12/18/2014   Circadian rhythm disorder 12/18/2014   History of prostate biopsy 12/18/2014   Dyslipidemia 12/18/2014   Perennial allergic rhinitis with seasonal variation 12/18/2014   Ear drum perforation 12/18/2014   Motion sickness 12/18/2014   Engages in travel abroad 79/05/4095   Dysmetabolic syndrome 35/32/9924    Past Surgical History:  Procedure Laterality Date   COLONOSCOPY WITH PROPOFOL N/A 09/29/2016   Procedure: COLONOSCOPY WITH PROPOFOL;  Surgeon: [name], MD;  [facility] ENDOSCOPY;  Service: Endoscopy;  Laterality: N/A;   PROSTATE BIOPSY     [facility]-Negative    PROSTATECTOMY  10/17/2018   PROSTATECTOMY  10/17/2018   Done at [facility]    Outpatient     Family History  Problem Relation Age of Onset   Hyperlipidemia Mother    Alcohol abuse Father    Stroke Father    Hyperlipidemia Father    Dementia Father     Social History   Socioeconomic History   Marital status: Married    Spouse name: Not on file   Number of children: 2   Years of education: Not on file   Highest education level: Bachelor's degree (e.g., BA, AB, BS)  Occupational History   Occupation: Customer service manager   Tobacco Use   Smoking status: Never   Smokeless tobacco: Former     Types: Snuff, Chew   Tobacco comments:    as a child   Vaping Use   Vaping Use: Never used  Substance and Sexual Activity   Alcohol use: Yes    Alcohol/week: 1.0 standard drink    Types: 1 Cans of beer per week    Comment: occasionally   Drug use: No   Sexual activity: Yes    Partners: Female  Other Topics Concern   Not on file  Social History Narrative   Not on file   Social Determinants of Health   Financial Resource Strain: Low Risk    Difficulty of Paying Living Expenses: Not hard at all  Food Insecurity: No Food Insecurity   Worried About Charity fundraiser in the Last Year: Never true   [place] in the Last Year: Never true  Transportation Needs: No Transportation Needs   Lack of Transportation (Medical): No   Lack of Transportation (Non-Medical): No  Physical Activity: Sufficiently Active   Days of Exercise per Week: 5 days   Minutes of Exercise per Session: 60 min  Stress: No Stress Concern Present   Feeling of Stress : Only a little  Social Connections: Engineer, building services of Communication with Friends and Family: Three times a week   Frequency of Social Gatherings with Friends and Family: Three times a week   Attends Religious Services: 1 to 4 times per year   Active Member  of Clubs or Organizations: Yes   Attends Archivist Meetings: 1 to 4 times per year   Marital Status: Married  Human resources officer Violence: Not At Risk   Fear of Current or Ex-Partner: No   Emotionally Abused: No   Physically Abused: No   Sexually Abused: No     Current Outpatient Medications:    Ascorbic Acid (VITAMIN C) 1000 MG tablet, Take 1 tablet by mouth., Disp: , Rfl:    Black Elderberry 50 MG/5ML SYRP, Take 5 mLs by mouth daily., Disp: 240 mL, Rfl: 0   chlorpheniramine-HYDROcodone (TUSSIONEX PENNKINETIC ER) 10-8 MG/5ML, Take 5 mLs by mouth every 12 (twelve) hours as needed., Disp: 140 mL, Rfl: 0   fexofenadine (ALLEGRA) 180 MG tablet, Take by  mouth., Disp: , Rfl:    meloxicam (MOBIC) 15 MG tablet, TAKE 1 TABLET(15 MG) BY MOUTH DAILY, Disp: 90 tablet, Rfl: 1   Omega-3 Fatty Acids (RA FISH OIL) 1000 MG CAPS, Take 1 capsule by mouth daily. , Disp: , Rfl:    atorvastatin (LIPITOR) 40 MG tablet, TAKE 1 TABLET(40 MG) BY MOUTH DAILY, Disp: 90 tablet, Rfl: 1   benzonatate (TESSALON) 100 MG capsule, Take 1-2 capsules (100-200 mg total) by mouth 2 (two) times daily as needed., Disp: 40 capsule, Rfl: 0   busPIRone (BUSPAR) 15 MG tablet, TAKE 1 TABLET(15 MG) BY MOUTH TWICE DAILY, Disp: 180 tablet, Rfl: 1   fluticasone (FLONASE) 50 MCG/ACT nasal spray, Place 2 sprays into both nostrils daily., Disp: 48 g, Rfl: 1   montelukast (SINGULAIR) 10 MG tablet, TAKE 1 TABLET(10 MG) BY MOUTH AT BEDTIME, Disp: 90 tablet, Rfl: 1   telmisartan (MICARDIS) 80 MG tablet, Take 1 tablet (80 mg total) by mouth daily., Disp: 90 tablet, Rfl: 1   zolpidem (AMBIEN) 10 MG tablet, Take 1 tablet (10 mg total) by mouth at bedtime as needed for sleep., Disp: 30 tablet, Rfl: 0  Allergies  Allergen Reactions   Ace Inhibitors Cough   Codeine Hives and Itching     ROS  Constitutional: Negative for fever or weight change.  Respiratory: positive for dry  cough but no shortness of breath.   Cardiovascular: Negative for chest pain or palpitations.  Gastrointestinal: Negative for abdominal pain, no bowel changes.  Musculoskeletal: Negative for gait problem or joint swelling.  Skin: Negative for rash.  Neurological: Negative for dizziness or headache.  No other specific complaints in a complete review of systems (except as listed in HPI above).    Objective  Vitals:   05/16/21 0918  BP: 108/66  Pulse: 79  Resp: 16  Temp: 97.9 F (36.6 C)  TempSrc: Oral  SpO2: 96%  Weight: 209 lb 12.8 oz (95.2 kg)  Height: 6\' 1"  (1.854 m)    Body mass index is 27.68 kg/m.  Physical Exam  Constitutional: Patient appears well-developed and well-nourished. No distress.  HENT:  Head: Normocephalic and atraumatic. Ears: B TMs ok, no erythema or effusion; Nose: Not done  Mouth/Throat: not done  Eyes: Conjunctivae and EOM are normal. Pupils are equal, round, and reactive to light. No scleral icterus.  Neck: Normal range of motion. Neck supple. No JVD present. No thyromegaly present.  Cardiovascular: Normal rate, regular rhythm and normal heart sounds.  No murmur heard. No BLE edema. Pulmonary/Chest: Effort normal and breath sounds normal. No respiratory distress. Abdominal: Soft. Bowel sounds are normal, no distension. There is no tenderness. no masses MALE GENITALIA: Not done, sees urologist  Musculoskeletal: Normal range of motion, DIP  of hands and some PIP swollen and tender Neurological: he is alert and oriented to person, place, and time. No cranial nerve deficit. Coordination, balance, strength, speech and gait are normal.  Skin: Skin is warm and dry. No rash noted. No erythema.  Psychiatric: Patient has a normal mood and affect. behavior is normal. Judgment and thought content normal.    Fall Risk: Fall Risk  05/16/2021 04/30/2021 12/09/2020 10/03/2020 08/21/2020  Falls in the past year? 0 0 0 0 0  Number falls in past yr: 0 0 0 0 0  Injury with Fall? 0 0 0 0 0  Risk for fall due to : No Fall Risks No Fall Risks No Fall Risks - -  Follow up Falls prevention discussed Falls prevention discussed Falls prevention discussed Falls evaluation completed -     Functional Status Survey: Is the patient deaf or have difficulty hearing?: No Does the patient have difficulty seeing, even when wearing glasses/contacts?: No Does the patient have difficulty concentrating, remembering, or making decisions?: No Does the patient have difficulty walking or climbing stairs?: No Does the patient have difficulty dressing or bathing?: No Does the patient have difficulty doing errands alone such as visiting a doctor's office or shopping?: No    Assessment & Plan  1. Well adult  exam  - CBC with Differential/Platelet - COMPLETE METABOLIC PANEL WITH GFR - Lipid panel - Hemoglobin A1c  2. Post-COVID chronic cough  - benzonatate (TESSALON) 100 MG capsule; Take 1-2 capsules (100-200 mg total) by mouth 2 (two) times daily as needed.  Dispense: 40 capsule; Refill: 0 - chlorpheniramine-HYDROcodone (TUSSIONEX PENNKINETIC ER) 10-8 MG/5ML; Take 5 mLs by mouth every 12 (twelve) hours as needed.  Dispense: 140 mL; Refill: 0  3. History of prostate cancer  Keep follow up with Urologist   4. Hypertension, benign  - telmisartan (MICARDIS) 80 MG tablet; Take 1 tablet (80 mg total) by mouth daily.  Dispense: 90 tablet; Refill: 1 - CBC with Differential/Platelet - COMPLETE METABOLIC PANEL WITH GFR  5. Circadian rhythm sleep disorder   6. Dyslipidemia  - atorvastatin (LIPITOR) 40 MG tablet; TAKE 1 TABLET(40 MG) BY MOUTH DAILY  Dispense: 90 tablet; Refill: 1 - Lipid panel  7. Hyperglycemia   8. GERD without esophagitis   9. GAD (generalized anxiety disorder)  - busPIRone (BUSPAR) 15 MG tablet; TAKE 1 TABLET(15 MG) BY MOUTH TWICE DAILY  Dispense: 180 tablet; Refill: 1  10. Erectile dysfunction after radical prostatectomy   11. Osteoarthritis of fingers of both hands   12. Perennial allergic rhinitis with seasonal variation  - montelukast (SINGULAIR) 10 MG tablet; TAKE 1 TABLET(10 MG) BY MOUTH AT BEDTIME  Dispense: 90 tablet; Refill: 1  13. Perennial allergic rhinitis  - fluticasone (FLONASE) 50 MCG/ACT nasal spray; Place 2 sprays into both nostrils daily.  Dispense: 48 g; Refill: 1  14. Need for Tdap vaccination  - Tdap vaccine greater than or equal to 7yo IM  15. Diabetes mellitus screening  - Hemoglobin A1c  16. Hydrocele in adult   44. Family history of heart disease  - CT CARDIAC SCORING; Future     -Prostate cancer screening and PSA options (with potential risks and benefits of testing vs not testing) were discussed along with recent  recs/guidelines. -USPSTF grade A and B recommendations reviewed with patient; age-appropriate recommendations, preventive care, screening tests, etc discussed and encouraged; healthy living encouraged; see AVS for patient education given to patient -Discussed importance of 150 minutes of physical activity weekly, eat  two servings of fish weekly, eat one serving of tree nuts ( cashews, pistachios, pecans, almonds.Marland Kitchen) every other day, eat 6 servings of fruit/vegetables daily and drink plenty of water and avoid sweet beverages.  -Reviewed Health Maintenance: yes

## 2021-05-16 ENCOUNTER — Ambulatory Visit (INDEPENDENT_AMBULATORY_CARE_PROVIDER_SITE_OTHER): Payer: 59 | Admitting: Family Medicine

## 2021-05-16 ENCOUNTER — Encounter: Payer: Self-pay | Admitting: Family Medicine

## 2021-05-16 VITALS — BP 108/66 | HR 79 | Temp 97.9°F | Resp 16 | Ht 73.0 in | Wt 209.8 lb

## 2021-05-16 DIAGNOSIS — N5231 Erectile dysfunction following radical prostatectomy: Secondary | ICD-10-CM

## 2021-05-16 DIAGNOSIS — J302 Other seasonal allergic rhinitis: Secondary | ICD-10-CM

## 2021-05-16 DIAGNOSIS — Z0000 Encounter for general adult medical examination without abnormal findings: Secondary | ICD-10-CM | POA: Diagnosis not present

## 2021-05-16 DIAGNOSIS — R053 Chronic cough: Secondary | ICD-10-CM

## 2021-05-16 DIAGNOSIS — M19041 Primary osteoarthritis, right hand: Secondary | ICD-10-CM

## 2021-05-16 DIAGNOSIS — K219 Gastro-esophageal reflux disease without esophagitis: Secondary | ICD-10-CM

## 2021-05-16 DIAGNOSIS — Z131 Encounter for screening for diabetes mellitus: Secondary | ICD-10-CM | POA: Diagnosis not present

## 2021-05-16 DIAGNOSIS — U099 Post covid-19 condition, unspecified: Secondary | ICD-10-CM

## 2021-05-16 DIAGNOSIS — G4720 Circadian rhythm sleep disorder, unspecified type: Secondary | ICD-10-CM | POA: Diagnosis not present

## 2021-05-16 DIAGNOSIS — E785 Hyperlipidemia, unspecified: Secondary | ICD-10-CM

## 2021-05-16 DIAGNOSIS — M19042 Primary osteoarthritis, left hand: Secondary | ICD-10-CM

## 2021-05-16 DIAGNOSIS — Z23 Encounter for immunization: Secondary | ICD-10-CM

## 2021-05-16 DIAGNOSIS — N433 Hydrocele, unspecified: Secondary | ICD-10-CM | POA: Insufficient documentation

## 2021-05-16 DIAGNOSIS — Z8546 Personal history of malignant neoplasm of prostate: Secondary | ICD-10-CM | POA: Diagnosis not present

## 2021-05-16 DIAGNOSIS — I10 Essential (primary) hypertension: Secondary | ICD-10-CM

## 2021-05-16 DIAGNOSIS — F411 Generalized anxiety disorder: Secondary | ICD-10-CM

## 2021-05-16 DIAGNOSIS — R739 Hyperglycemia, unspecified: Secondary | ICD-10-CM

## 2021-05-16 DIAGNOSIS — Z8249 Family history of ischemic heart disease and other diseases of the circulatory system: Secondary | ICD-10-CM

## 2021-05-16 DIAGNOSIS — J3089 Other allergic rhinitis: Secondary | ICD-10-CM

## 2021-05-16 MED ORDER — TELMISARTAN 80 MG PO TABS: 80.0000 mg | ORAL_TABLET | Freq: Every day | ORAL | 1 refills | Status: DC

## 2021-05-16 MED ORDER — ATORVASTATIN CALCIUM 40 MG PO TABS: ORAL_TABLET | ORAL | 1 refills | Status: DC

## 2021-05-16 MED ORDER — BREZTRI AEROSPHERE 160-9-4.8 MCG/ACT IN AERO: 2.0000 | INHALATION_SPRAY | Freq: Two times a day (BID) | RESPIRATORY_TRACT | 0 refills | Status: DC

## 2021-05-16 MED ORDER — MONTELUKAST SODIUM 10 MG PO TABS: ORAL_TABLET | ORAL | 1 refills | Status: DC

## 2021-05-16 MED ORDER — HYDROCOD POLI-CHLORPHE POLI ER 10-8 MG/5ML PO SUER: 5.0000 mL | Freq: Two times a day (BID) | ORAL | 0 refills | Status: DC | PRN

## 2021-05-16 MED ORDER — FLUTICASONE PROPIONATE 50 MCG/ACT NA SUSP: 2.0000 | Freq: Every day | NASAL | 1 refills | Status: DC

## 2021-05-16 MED ORDER — BENZONATATE 100 MG PO CAPS: 100.0000 mg | ORAL_CAPSULE | Freq: Two times a day (BID) | ORAL | 0 refills | Status: DC | PRN

## 2021-05-16 MED ORDER — BUSPIRONE HCL 15 MG PO TABS: ORAL_TABLET | ORAL | 1 refills | Status: DC

## 2021-05-16 NOTE — Patient Instructions (Signed)

## 2021-05-17 LAB — CBC WITH DIFFERENTIAL/PLATELET
Absolute Monocytes: 492 cells/uL (ref 200–950)
Basophils Absolute: 18 cells/uL (ref 0–200)
Basophils Relative: 0.4 %
Eosinophils Absolute: 51 cells/uL (ref 15–500)
Eosinophils Relative: 1.1 %
HCT: 45.9 % (ref 38.5–50.0)
Hemoglobin: 15.3 g/dL (ref 13.2–17.1)
Lymphs Abs: 1532 cells/uL (ref 850–3900)
MCH: 29.5 pg (ref 27.0–33.0)
MCHC: 33.3 g/dL (ref 32.0–36.0)
MCV: 88.4 fL (ref 80.0–100.0)
MPV: 10.4 fL (ref 7.5–12.5)
Monocytes Relative: 10.7 %
Neutro Abs: 2507 cells/uL (ref 1500–7800)
Neutrophils Relative %: 54.5 %
Platelets: 244 10*3/uL (ref 140–400)
RBC: 5.19 10*6/uL (ref 4.20–5.80)
RDW: 12 % (ref 11.0–15.0)
Total Lymphocyte: 33.3 %
WBC: 4.6 10*3/uL (ref 3.8–10.8)

## 2021-05-17 LAB — COMPLETE METABOLIC PANEL WITH GFR
AG Ratio: 2.1 (calc) (ref 1.0–2.5)
ALT: 30 U/L (ref 9–46)
AST: 20 U/L (ref 10–35)
Albumin: 4.7 g/dL (ref 3.6–5.1)
Alkaline phosphatase (APISO): 51 U/L (ref 35–144)
BUN: 21 mg/dL (ref 7–25)
CO2: 30 mmol/L (ref 20–32)
Calcium: 9.5 mg/dL (ref 8.6–10.3)
Chloride: 104 mmol/L (ref 98–110)
Creat: 0.79 mg/dL (ref 0.70–1.30)
Globulin: 2.2 g/dL (calc) (ref 1.9–3.7)
Glucose, Bld: 89 mg/dL (ref 65–99)
Potassium: 5 mmol/L (ref 3.5–5.3)
Sodium: 139 mmol/L (ref 135–146)
Total Bilirubin: 0.5 mg/dL (ref 0.2–1.2)
Total Protein: 6.9 g/dL (ref 6.1–8.1)
eGFR: 105 mL/min/{1.73_m2} (ref >=60)

## 2021-05-17 LAB — LIPID PANEL
Cholesterol: 125 mg/dL (ref <200)
HDL: 36 mg/dL — ABNORMAL LOW (ref >=40)
LDL Cholesterol (Calc): 65 mg/dL (calc)
Non-HDL Cholesterol (Calc): 89 mg/dL (calc) (ref <130)
Total CHOL/HDL Ratio: 3.5 (calc) (ref <5.0)
Triglycerides: 165 mg/dL — ABNORMAL HIGH (ref <150)

## 2021-05-17 LAB — HEMOGLOBIN A1C
Hgb A1c MFr Bld: 5.5 % of total Hgb (ref <5.7)
Mean Plasma Glucose: 111 mg/dL
eAG (mmol/L): 6.2 mmol/L

## 2021-05-30 ENCOUNTER — Other Ambulatory Visit: Payer: Self-pay

## 2021-05-30 ENCOUNTER — Ambulatory Visit
Admission: RE | Admit: 2021-05-30 | Discharge: 2021-05-30 | Disposition: A | Discharge status: Home / Self Care | Payer: 59 | Source: Ambulatory Visit | Attending: Family Medicine | Admitting: Family Medicine

## 2021-05-30 DIAGNOSIS — Z8249 Family history of ischemic heart disease and other diseases of the circulatory system: Secondary | ICD-10-CM | POA: Insufficient documentation

## 2021-06-02 ENCOUNTER — Encounter: Payer: Self-pay | Admitting: Family Medicine

## 2021-07-17 ENCOUNTER — Other Ambulatory Visit: Payer: Self-pay | Admitting: Family Medicine

## 2021-07-17 DIAGNOSIS — M19041 Primary osteoarthritis, right hand: Secondary | ICD-10-CM

## 2021-08-14 ENCOUNTER — Other Ambulatory Visit: Payer: Self-pay | Admitting: Family Medicine

## 2021-08-14 DIAGNOSIS — M19041 Primary osteoarthritis, right hand: Secondary | ICD-10-CM

## 2021-10-29 NOTE — Progress Notes (Signed)
Name: Gavin Scott   MRN: [id number]    DOB: 08/17/1965   Date:10/30/2021       Progress Note  Subjective  Chief Complaint  Follow up   HPI  HTN: BP is low today but usually around 120's at home and no dizziness, continue current medication Taking medication . He is eating healthier since no longer travelling all the time , eating healthier and also walking more often    Hyperlipidemia: LDL was at goal, taking Lipitor daily and fish oil otc, more physically active , walking 5-7 miles per day, eats healthy. Last LDL was 65, he had an average risk of heart disease based on cardiac calcium score done  05/2021     Insomnia: only takes medication Ambien prn , mostly when he used to travel, discussed mindfulness at night .   Depression/GAD: he states mother is now living with his sister in [place] and doing well at this time. He is taking care of mother's estate, he wakes up around 2 am and has difficulty falling back asleep    History of prostatectomy:10/2017  doing well, it was for prostate cancer, but seeing urologist and last PSA was Dec 2021 and normal . He still has ED using Tri-mix since Cialis was ineffective.   Hyperglycemia: he denies polyphagia, polydipsia or polyuria. , eating healthier .Last A1C down to 5.3 %, in the past as high as 6.1 % . Continue regular physical activity    GERD: he states symptoms are stable at this time    Family history of dementia: mother, father had dementia.  Parents started to have symptoms in their 2's. Neither one diagnosed with Alzheimer's , mother had a stroke and her memory has declined since.  Discussed memory games again.  Dyslipidemia: He has family history of heart disease in their sixties's . He denies chest pain, palpitation or decrease in exercise tolerance. Reviewed cardiac calcium score   Patient Active Problem List   Diagnosis Date Noted   Hydrocele in adult 05/16/2021   Rectal polyp    Benign neoplasm of ascending colon     GERD (gastroesophageal reflux disease) 08/19/2015   Generalized anxiety disorder 12/18/2014   Basal cell carcinoma 12/18/2014   Benign essential HTN 12/18/2014   Circadian rhythm disorder 12/18/2014   History of prostate biopsy 12/18/2014   Dyslipidemia 12/18/2014   Perennial allergic rhinitis with seasonal variation 12/18/2014   Ear drum perforation 12/18/2014   Motion sickness 12/18/2014   Engages in travel abroad 56/31/4970   Dysmetabolic syndrome 26/37/8588    Past Surgical History:  Procedure Laterality Date   COLONOSCOPY WITH PROPOFOL N/A 09/29/2016   Procedure: COLONOSCOPY WITH PROPOFOL;  Surgeon: [name], MD;  Location: [facility] ENDOSCOPY;  Service: Endoscopy;  Laterality: N/A;   PROSTATE BIOPSY     [facility]-Negative    PROSTATECTOMY  10/17/2018   PROSTATECTOMY  10/17/2018   Done at [employer] [employer] [facility]    Outpatient     Family History  Problem Relation Age of Onset   Hyperlipidemia Mother    Alcohol abuse Father    Stroke Father    Hyperlipidemia Father    Dementia Father     Social History   Tobacco Use   Smoking status: Never   Smokeless tobacco: Former    Types: Snuff, Chew   Tobacco comments:    as a child   Substance Use Topics   Alcohol use: Yes    Alcohol/week: 1.0 standard drink of  alcohol    Types: 1 Cans of beer per week    Comment: occasionally     Current Outpatient Medications:    Ascorbic Acid (VITAMIN C) 1000 MG tablet, Take 1 tablet by mouth., Disp: , Rfl:    atorvastatin (LIPITOR) 40 MG tablet, TAKE 1 TABLET(40 MG) BY MOUTH DAILY, Disp: 90 tablet, Rfl: 1   Black Elderberry 50 MG/5ML SYRP, Take 5 mLs by mouth daily., Disp: 240 mL, Rfl: 0   busPIRone (BUSPAR) 15 MG tablet, TAKE 1 TABLET(15 MG) BY MOUTH TWICE DAILY, Disp: 180 tablet, Rfl: 1   chlorpheniramine-HYDROcodone (TUSSIONEX PENNKINETIC ER) 10-8 MG/5ML, Take 5 mLs by mouth every 12 (twelve) hours as needed., Disp: 140 mL, Rfl: 0   fexofenadine (ALLEGRA)  180 MG tablet, Take by mouth., Disp: , Rfl:    fluticasone (FLONASE) 50 MCG/ACT nasal spray, Place 2 sprays into both nostrils daily., Disp: 48 g, Rfl: 1   meloxicam (MOBIC) 15 MG tablet, TAKE 1 TABLET BY MOUTH DAILY, Disp: 30 tablet, Rfl: 0   montelukast (SINGULAIR) 10 MG tablet, TAKE 1 TABLET(10 MG) BY MOUTH AT BEDTIME, Disp: 90 tablet, Rfl: 1   Omega-3 Fatty Acids (RA FISH OIL) 1000 MG CAPS, Take 1 capsule by mouth daily. , Disp: , Rfl:    tadalafil (CIALIS) 5 MG tablet, Take 1 tablet (5 mg total) by mouth nightly., Disp: , Rfl:    telmisartan (MICARDIS) 80 MG tablet, Take 1 tablet (80 mg total) by mouth daily., Disp: 90 tablet, Rfl: 1   benzonatate (TESSALON) 100 MG capsule, Take 1-2 capsules (100-200 mg total) by mouth 2 (two) times daily as needed. (Patient not taking: Reported on 10/30/2021), Disp: 40 capsule, Rfl: 0   Budeson-Glycopyrrol-Formoterol (BREZTRI AEROSPHERE) 160-9-4.8 MCG/ACT AERO, Inhale 2 puffs into the lungs 2 (two) times daily. (Patient not taking: Reported on 10/30/2021), Disp: 10.7 g, Rfl: 0   zolpidem (AMBIEN) 10 MG tablet, Take 1 tablet (10 mg total) by mouth at bedtime as needed for sleep., Disp: 30 tablet, Rfl: 0  Allergies  Allergen Reactions   Ace Inhibitors Cough   Codeine Hives and Itching    I personally reviewed active problem list, medication list, allergies, family history, social history, health maintenance with the patient/caregiver today.   ROS  Constitutional: Negative for fever or weight change.  Respiratory: Negative for cough and shortness of breath.   Cardiovascular: Negative for chest pain or palpitations.  Gastrointestinal: Negative for abdominal pain, no bowel changes.  Musculoskeletal: Negative for gait problem or joint swelling.  Skin: Negative for rash.  Neurological: Negative for dizziness or headache.  No other specific complaints in a complete review of systems (except as listed in HPI above).   Objective  Vitals:   10/30/21 1451   BP: 128/84  Pulse: 89  Resp: 18  Temp: 98.1 F (36.7 C)  TempSrc: Oral  SpO2: 99%  Weight: 212 lb 8 oz (96.4 kg)  Height: '6\' 1"'$  (1.854 m)    Body mass index is 28.04 kg/m.  Physical Exam  Constitutional: Patient appears well-developed and well-nourished. Overweight.  No distress.  HEENT: head atraumatic, normocephalic, pupils equal and reactive to light, neck supple Cardiovascular: Normal rate, regular rhythm and normal heart sounds.  No murmur heard. No BLE edema. Pulmonary/Chest: Effort normal and breath sounds normal. No respiratory distress. Abdominal: Soft.  There is no tenderness. Psychiatric: Patient has a normal mood and affect. behavior is normal. Judgment and thought content normal.  Muscular skeletal: brace below right knee   PHQ2/9:  10/30/2021    2:57 PM 05/16/2021    9:10 AM 04/30/2021    7:41 AM 12/09/2020   12:56 PM 10/03/2020    9:41 AM  Depression screen PHQ 2/9  Decreased Interest 0 0 0 0 0  Down, Depressed, Hopeless 0 0 0 0 0  PHQ - 2 Score 0 0 0 0 0  Altered sleeping 0 0 0  0  Tired, decreased energy 0 0 0  0  Change in appetite 0 0 0  0  Feeling bad or failure about yourself  0 0 0  0  Trouble concentrating 0 0 0  0  Moving slowly or fidgety/restless 0 0 0  0  Suicidal thoughts 0 0 0  0  PHQ-9 Score 0 0 0  0  Difficult doing work/chores  Not difficult at all   Not difficult at all    phq 9 is negative   Fall Risk:    10/30/2021    2:31 PM 05/16/2021    9:09 AM 04/30/2021    7:41 AM 12/09/2020   12:56 PM 10/03/2020    9:41 AM  Fall Risk   Falls in the past year? 0 0 0 0 0  Number falls in past yr:  0 0 0 0  Injury with Fall?  0 0 0 0  Risk for fall due to : No Fall Risks No Fall Risks No Fall Risks No Fall Risks   Follow up Falls prevention discussed Falls prevention discussed Falls prevention discussed Falls prevention discussed Falls evaluation completed      Functional Status Survey: Is the patient deaf or have difficulty hearing?:  No Does the patient have difficulty seeing, even when wearing glasses/contacts?: No Does the patient have difficulty concentrating, remembering, or making decisions?: No Does the patient have difficulty walking or climbing stairs?: No Does the patient have difficulty dressing or bathing?: No Does the patient have difficulty doing errands alone such as visiting a doctor's office or shopping?: No    Assessment & Plan  1. Hypertension, benign  - telmisartan (MICARDIS) 80 MG tablet; Take 1 tablet (80 mg total) by mouth daily.  Dispense: 90 tablet; Refill: 1  2. Dyslipidemia  - atorvastatin (LIPITOR) 40 MG tablet; TAKE 1 TABLET(40 MG) BY MOUTH DAILY  Dispense: 90 tablet; Refill: 1  3. History of prostate cancer   4. Circadian rhythm sleep disorder   5. GERD without esophagitis   6. Perennial allergic rhinitis with seasonal variation  - montelukast (SINGULAIR) 10 MG tablet; TAKE 1 TABLET(10 MG) BY MOUTH AT BEDTIME  Dispense: 90 tablet; Refill: 1  7. Osteoarthritis of fingers of both hands   8. Patellar tendinitis of left knee   9. GAD (generalized anxiety disorder)  - busPIRone (BUSPAR) 15 MG tablet; TAKE 1 TABLET(15 MG) BY MOUTH TWICE DAILY  Dispense: 180 tablet; Refill: 1  10. Perennial allergic rhinitis  - fluticasone (FLONASE) 50 MCG/ACT nasal spray; Place 2 sprays into both nostrils daily.  Dispense: 48 g; Refill: 1

## 2021-10-30 ENCOUNTER — Ambulatory Visit (INDEPENDENT_AMBULATORY_CARE_PROVIDER_SITE_OTHER): Payer: 59 | Admitting: Family Medicine

## 2021-10-30 ENCOUNTER — Encounter: Payer: Self-pay | Admitting: Family Medicine

## 2021-10-30 VITALS — BP 128/84 | HR 89 | Temp 98.1°F | Resp 18 | Ht 73.0 in | Wt 212.5 lb

## 2021-10-30 DIAGNOSIS — G4720 Circadian rhythm sleep disorder, unspecified type: Secondary | ICD-10-CM | POA: Diagnosis not present

## 2021-10-30 DIAGNOSIS — J3089 Other allergic rhinitis: Secondary | ICD-10-CM

## 2021-10-30 DIAGNOSIS — J302 Other seasonal allergic rhinitis: Secondary | ICD-10-CM

## 2021-10-30 DIAGNOSIS — E785 Hyperlipidemia, unspecified: Secondary | ICD-10-CM

## 2021-10-30 DIAGNOSIS — Z8546 Personal history of malignant neoplasm of prostate: Secondary | ICD-10-CM | POA: Insufficient documentation

## 2021-10-30 DIAGNOSIS — I10 Essential (primary) hypertension: Secondary | ICD-10-CM

## 2021-10-30 DIAGNOSIS — M7652 Patellar tendinitis, left knee: Secondary | ICD-10-CM

## 2021-10-30 DIAGNOSIS — F411 Generalized anxiety disorder: Secondary | ICD-10-CM

## 2021-10-30 DIAGNOSIS — M19041 Primary osteoarthritis, right hand: Secondary | ICD-10-CM | POA: Insufficient documentation

## 2021-10-30 DIAGNOSIS — K219 Gastro-esophageal reflux disease without esophagitis: Secondary | ICD-10-CM

## 2021-10-30 DIAGNOSIS — M19042 Primary osteoarthritis, left hand: Secondary | ICD-10-CM

## 2021-10-30 MED ORDER — MONTELUKAST SODIUM 10 MG PO TABS: ORAL_TABLET | ORAL | 1 refills | Status: DC

## 2021-10-30 MED ORDER — FLUTICASONE PROPIONATE 50 MCG/ACT NA SUSP: 2.0000 | Freq: Every day | NASAL | 1 refills | Status: DC

## 2021-10-30 MED ORDER — ATORVASTATIN CALCIUM 40 MG PO TABS: ORAL_TABLET | ORAL | 1 refills | Status: DC

## 2021-10-30 MED ORDER — TELMISARTAN 80 MG PO TABS: 80.0000 mg | ORAL_TABLET | Freq: Every day | ORAL | 1 refills | Status: DC

## 2021-10-30 MED ORDER — BUSPIRONE HCL 15 MG PO TABS: ORAL_TABLET | ORAL | 1 refills | Status: DC

## 2021-11-03 ENCOUNTER — Other Ambulatory Visit: Payer: Self-pay | Admitting: Family Medicine

## 2021-11-03 DIAGNOSIS — F411 Generalized anxiety disorder: Secondary | ICD-10-CM

## 2021-11-03 DIAGNOSIS — J3089 Other allergic rhinitis: Secondary | ICD-10-CM

## 2021-11-03 DIAGNOSIS — I10 Essential (primary) hypertension: Secondary | ICD-10-CM

## 2021-11-03 DIAGNOSIS — J302 Other seasonal allergic rhinitis: Secondary | ICD-10-CM

## 2021-11-03 DIAGNOSIS — E785 Hyperlipidemia, unspecified: Secondary | ICD-10-CM

## 2021-11-03 NOTE — Telephone Encounter (Signed)
Pt wants medication sent to:  [facility] [place] [address] Phone:  [phone]  Fax:  [phone]

## 2021-11-03 NOTE — Telephone Encounter (Signed)
Pts medications were sent to the wrong pharmacy / pt needs resent to [facility] [place] [address]  telmisartan (MICARDIS) 80 MG tablet   montelukast (SINGULAIR) 10 MG tablet   fluticasone (FLONASE) 50 MCG/ACT nasal spray   busPIRone (BUSPAR) 15 MG tablet  atorvastatin (LIPITOR) 40 MG tablet

## 2021-11-04 MED ORDER — BUSPIRONE HCL 15 MG PO TABS: ORAL_TABLET | ORAL | 1 refills | Status: DC

## 2021-11-04 MED ORDER — FLUTICASONE PROPIONATE 50 MCG/ACT NA SUSP: 2.0000 | Freq: Every day | NASAL | 1 refills | Status: DC

## 2021-11-04 MED ORDER — TELMISARTAN 80 MG PO TABS: 80.0000 mg | ORAL_TABLET | Freq: Every day | ORAL | 1 refills | Status: DC

## 2021-11-04 MED ORDER — ATORVASTATIN CALCIUM 40 MG PO TABS: ORAL_TABLET | ORAL | 1 refills | Status: DC

## 2021-11-04 MED ORDER — MONTELUKAST SODIUM 10 MG PO TABS: ORAL_TABLET | ORAL | 1 refills | Status: DC

## 2021-11-04 NOTE — Telephone Encounter (Signed)
Resending to different pharmacy per pt request Requested Prescriptions  Pending Prescriptions Disp Refills  . telmisartan (MICARDIS) 80 MG tablet 90 tablet 1    Sig: Take 1 tablet (80 mg total) by mouth daily.     Cardiovascular:  Angiotensin Receptor Blockers Passed - 11/03/2021 12:00 PM      Passed - Cr in normal range and within 180 days    Creat  Date Value Ref Range Status  05/16/2021 0.79 0.70 - 1.30 mg/dL Final         Passed - K in normal range and within 180 days    Potassium  Date Value Ref Range Status  05/16/2021 5.0 3.5 - 5.3 mmol/L Final         Passed - Patient is not pregnant      Passed - Last BP in normal range    BP Readings from Last 1 Encounters:  10/30/21 128/84         Passed - Valid encounter within last 6 months    Recent Outpatient Visits          5 days ago Hypertension, benign   [facility] Steele Sizer, MD   5 months ago Hypertension, benign   [facility] Steele Sizer, MD   6 months ago COVID-19   [facility] Steele Sizer, MD   11 months ago Hypertension, benign   [facility] Steele Sizer, MD   1 year ago Ganglion cyst   [facility] Kathrine Haddock, NP      Future Appointments            In 1 week Brendolyn Patty, MD [place]   In 6 months Steele Sizer, MD [facility], [place]   In 6 months Steele Sizer, MD [facility]           . montelukast (SINGULAIR) 10 MG tablet 90 tablet 1    Sig: TAKE 1 TABLET(10 MG) BY MOUTH AT BEDTIME     Pulmonology:  Leukotriene Inhibitors Passed - 11/03/2021 12:00 PM      Passed - Valid encounter within last 12 months    Recent Outpatient Visits          5 days ago Hypertension, benign   [facility] Steele Sizer, MD   5 months ago Hypertension, benign   [facility] Steele Sizer, MD   6  months ago COVID-19   [facility], [facility]   11 months ago Hypertension, benign   [facility], [facility]   1 year ago Ganglion cyst   [facility] Kathrine Haddock, NP      Future Appointments            In 1 week Brendolyn Patty, MD [place]   In 6 months Steele Sizer, MD [facility], [place]   In 6 months Steele Sizer, MD [facility], [place]           . fluticasone (FLONASE) 50 MCG/ACT nasal spray 48 g 1    Sig: Place 2 sprays into both nostrils daily.     Ear, Nose, and Throat: Nasal Preparations - Corticosteroids Passed - 11/03/2021 12:00 PM      Passed - Valid encounter within last 12 months    Recent Outpatient Visits          5 days  ago Hypertension, benign   [facility] Steele Sizer, MD   5 months ago Hypertension, benign   [facility] Steele Sizer, MD   6 months ago COVID-19   [facility] Steele Sizer, MD   11 months ago Hypertension, benign   [facility] Steele Sizer, MD   1 year ago Ganglion cyst   [facility] Kathrine Haddock, NP      Future Appointments            In 1 week Brendolyn Patty, MD [place]   In 6 months Steele Sizer, MD [facility], [place]   In 6 months Steele Sizer, MD [facility]           . busPIRone (BUSPAR) 15 MG tablet 180 tablet 1    Sig: TAKE 1 TABLET(15 MG) BY MOUTH TWICE DAILY     Psychiatry: Anxiolytics/Hypnotics - Non-controlled Passed - 11/03/2021 12:00 PM      Passed - Valid encounter within last 12 months    Recent Outpatient Visits          5 days ago Hypertension, benign   [facility] Steele Sizer, MD   5 months ago Hypertension, benign   [facility] Steele Sizer, MD   6 months ago  COVID-19   [facility] Steele Sizer, MD   11 months ago Hypertension, benign   [facility] Steele Sizer, MD   1 year ago Ganglion cyst   [facility] Kathrine Haddock, NP      Future Appointments            In 1 week Brendolyn Patty, MD [place]   In 6 months Steele Sizer, MD [facility], [place]   In 6 months Steele Sizer, MD [facility], [place]           . atorvastatin (LIPITOR) 40 MG tablet 90 tablet 1    Sig: TAKE 1 TABLET(40 MG) BY MOUTH DAILY     Cardiovascular:  Antilipid - Statins Failed - 11/03/2021 12:00 PM      Failed - Lipid Panel in normal range within the last 12 months    Cholesterol, Total  Date Value Ref Range Status  01/30/2016 143 100 - 199 mg/dL Final   Cholesterol  Date Value Ref Range Status  05/16/2021 125 <200 mg/dL Final   LDL Cholesterol (Calc)  Date Value Ref Range Status  05/16/2021 65 mg/dL (calc) Final    Comment:    Reference range: <100 . Desirable range <100 mg/dL for primary prevention;   <70 mg/dL for patients with CHD or diabetic patients  with > or = 2 CHD risk factors. Marland Kitchen LDL-C is now calculated using the Martin-Hopkins  calculation, which is a validated novel method providing  better accuracy than the Friedewald equation in the  estimation of LDL-C.  Cresenciano Genre et al. Annamaria Helling. 4696;295(28): 2061-2068  (http://education.[employer].com/faq/FAQ164)    HDL  Date Value Ref Range Status  05/16/2021 36 (L) > OR = 40 mg/dL Final  01/30/2016 30 (L) >39 mg/dL Final   Triglycerides  Date Value Ref Range Status  05/16/2021 165 (H) <150 mg/dL Final         Passed - Patient is not pregnant      Passed - Valid encounter within last 12 months    Recent Outpatient Visits  5 days ago Hypertension, benign   [facility] Steele Sizer, MD   5 months ago Hypertension, benign   [facility] Steele Sizer, MD   6 months ago COVID-19   [facility] Steele Sizer, MD   11 months ago Hypertension, benign   [facility] Steele Sizer, MD   1 year ago Ganglion cyst   [facility] Kathrine Haddock, NP      Future Appointments            In 1 week Brendolyn Patty, MD [place]   In 6 months Steele Sizer, MD [facility], [place]   In 6 months Steele Sizer, MD [facility]

## 2021-11-13 ENCOUNTER — Ambulatory Visit: Payer: 59 | Admitting: Family Medicine

## 2021-11-17 ENCOUNTER — Ambulatory Visit (INDEPENDENT_AMBULATORY_CARE_PROVIDER_SITE_OTHER): Payer: 59 | Admitting: Dermatology

## 2021-11-17 DIAGNOSIS — L570 Actinic keratosis: Secondary | ICD-10-CM | POA: Diagnosis not present

## 2021-11-17 DIAGNOSIS — D225 Melanocytic nevi of trunk: Secondary | ICD-10-CM

## 2021-11-17 DIAGNOSIS — D3610 Benign neoplasm of peripheral nerves and autonomic nervous system, unspecified: Secondary | ICD-10-CM | POA: Diagnosis not present

## 2021-11-17 DIAGNOSIS — D2372 Other benign neoplasm of skin of left lower limb, including hip: Secondary | ICD-10-CM

## 2021-11-17 DIAGNOSIS — Z1283 Encounter for screening for malignant neoplasm of skin: Secondary | ICD-10-CM

## 2021-11-17 DIAGNOSIS — L720 Epidermal cyst: Secondary | ICD-10-CM

## 2021-11-17 DIAGNOSIS — L821 Other seborrheic keratosis: Secondary | ICD-10-CM

## 2021-11-17 DIAGNOSIS — L578 Other skin changes due to chronic exposure to nonionizing radiation: Secondary | ICD-10-CM

## 2021-11-17 DIAGNOSIS — D2239 Melanocytic nevi of other parts of face: Secondary | ICD-10-CM | POA: Diagnosis not present

## 2021-11-17 DIAGNOSIS — L814 Other melanin hyperpigmentation: Secondary | ICD-10-CM

## 2021-11-17 DIAGNOSIS — Z85828 Personal history of other malignant neoplasm of skin: Secondary | ICD-10-CM

## 2021-11-17 DIAGNOSIS — D229 Melanocytic nevi, unspecified: Secondary | ICD-10-CM

## 2021-11-17 NOTE — Progress Notes (Signed)
Follow-Up Visit   Subjective  Gavin Scott is a 56 y.o. male who presents for the following: Annual Exam.  The patient presents for Upper Body Skin Exam (UBSE) for skin cancer screening and mole check.  The patient has spots, moles and lesions to be evaluated, some may be new or changing.    The following portions of the chart were reviewed this encounter and updated as appropriate:       Review of Systems:  No other skin or systemic complaints except as noted in HPI or Assessment and Plan.  Objective  Well appearing patient in no apparent distress; mood and affect are within normal limits.  A focused examination was performed including all skin waist up, lower legs. Relevant physical exam findings are noted in the Assessment and Plan.  Left Chest, Right Abdomen 3 mm flesh papule left chest   5 x 3 mm brown macule right abdomen  4.0 x 3.0 mm med brown macule left post ankle    R inf nasal tip, inf central nasal tip 1.5 mm firm pink flesh papules  Right Upper Arm 32m soft flesh papule above elbow  L medial chest 0.5 cm waxy flesh lobulated papule, no change per pt  Left Forearm x 2 (2) Pink scaly macules    Assessment & Plan  Skin cancer screening performed today.  Actinic Damage - chronic, secondary to cumulative UV radiation exposure/sun exposure over time - diffuse scaly erythematous macules with underlying dyspigmentation - Recommend daily broad spectrum sunscreen SPF 30+ to sun-exposed areas, reapply every 2 hours as needed.  - Recommend staying in the shade or wearing long sleeves, sun glasses (UVA+UVB protection) and wide brim hats (4-inch brim around the entire circumference of the hat). - Call for new or changing lesions.  Milia - tiny firm white papule of the left chest - type of cyst - benign - may be extracted if symptomatic - observe  Lentigines - Scattered tan macules - Due to sun exposure - Benign-appering, observe - Recommend daily broad  spectrum sunscreen SPF 30+ to sun-exposed areas, reapply every 2 hours as needed. - Call for any changes  Seborrheic Keratoses - Stuck-on, waxy, tan-brown papules and/or plaques, including back  - Benign-appearing - Discussed benign etiology and prognosis. - Observe - Call for any changes  Dermatofibroma - Firm pink/brown papulenodule with dimple sign, L upper knee - Benign appearing - Call for any changes  Melanocytic Nevi - Tan-brown and/or pink-flesh-colored symmetric macules and papules - Benign appearing on exam today - Observation - Call clinic for new or changing moles - Recommend daily use of broad spectrum spf 30+ sunscreen to sun-exposed areas.   History of Basal Cell Carcinoma of the Skin - No evidence of recurrence today of the right upper arm, back - Recommend regular full body skin exams - Recommend daily broad spectrum sunscreen SPF 30+ to sun-exposed areas, reapply every 2 hours as needed.  - Call if any new or changing lesions are noted between office visits  Nevus Left Chest, Right Abdomen  Benign-appearing.  Stable. Observation.  Call clinic for new or changing moles.  Recommend daily use of broad spectrum spf 30+ sunscreen to sun-exposed areas.   Fibrous papule of nose R inf nasal tip, inf central nasal tip  Stable. Benign, Observe.   Neurofibroma Right Upper Arm  vs Nevus  Benign appearing. Observation.   Seborrheic keratosis L medial chest  vs Nevus  Reassured benign age-related growth.  Recommend observation.  Discussed cryotherapy  or biopsy if spot(s) become irritated or inflamed.  AK (actinic keratosis) (2) Left Forearm x 2  Actinic keratoses are precancerous spots that appear secondary to cumulative UV radiation exposure/sun exposure over time. They are chronic with expected duration over 1 year. A portion of actinic keratoses will progress to squamous cell carcinoma of the skin. It is not possible to reliably predict which spots will  progress to skin cancer and so treatment is recommended to prevent development of skin cancer.  Recommend daily broad spectrum sunscreen SPF 30+ to sun-exposed areas, reapply every 2 hours as needed.  Recommend staying in the shade or wearing long sleeves, sun glasses (UVA+UVB protection) and wide brim hats (4-inch brim around the entire circumference of the hat). Call for new or changing lesions.  Destruction of lesion - Left Forearm x 2  Destruction method: cryotherapy   Informed consent: discussed and consent obtained   Lesion destroyed using liquid nitrogen: Yes   Region frozen until ice ball extended beyond lesion: Yes   Outcome: patient tolerated procedure well with no complications   Post-procedure details: wound care instructions given   Additional details:  Prior to procedure, discussed risks of blister formation, small wound, skin dyspigmentation, or rare scar following cryotherapy. Recommend Vaseline ointment to treated areas while healing.    Return in about 1 year (around 11/18/2022) for UBSE.  IJamesetta Orleans, CMA, am acting as scribe for Brendolyn Patty, MD .  Documentation: I have reviewed the above documentation for accuracy and completeness, and I agree with the above.  Brendolyn Patty MD

## 2021-11-17 NOTE — Patient Instructions (Addendum)

## 2021-11-21 ENCOUNTER — Other Ambulatory Visit: Payer: Self-pay | Admitting: Orthopedic Surgery

## 2021-11-25 ENCOUNTER — Encounter
Admission: RE | Admit: 2021-11-25 | Discharge: 2021-11-25 | Disposition: A | Discharge status: Home / Self Care | Payer: 59 | Source: Ambulatory Visit | Attending: Orthopedic Surgery | Admitting: Orthopedic Surgery

## 2021-11-25 ENCOUNTER — Other Ambulatory Visit: Payer: Self-pay

## 2021-11-25 VITALS — Ht 73.0 in | Wt 205.0 lb

## 2021-11-25 DIAGNOSIS — I10 Essential (primary) hypertension: Secondary | ICD-10-CM

## 2021-11-25 NOTE — Patient Instructions (Addendum)
Your procedure is scheduled on: Thurs. 8/17 Report to Day Surgery. Stop by registration desk in lobby first To find out your arrival time please call [phone] between 1PM - 3PM on  Wed. 8/17.  Remember: Instructions that are not followed completely may result in serious medical risk,  up to and including death, or upon the discretion of your surgeon and anesthesiologist your  surgery may need to be rescheduled.     _X__ 1. Do not eat food after midnight the night before your procedure.                 No chewing gum or hard candies. You may drink clear liquids up to 2 hours                 before you are scheduled to arrive for your surgery- DO not drink clear                 liquids within 2 hours of the start of your surgery.                 Clear Liquids include:  water, apple juice without pulp, clear Gatorade, G2 or                  Gatorade Zero (avoid Red/Purple/Blue), Black Coffee or Tea (Do not add                 anything to coffee or tea).  __X__2.  On the morning of surgery brush your teeth with toothpaste and water, you                may rinse your mouth with mouthwash if you wish.  Do not swallow any toothpaste of mouthwash.     _X__ 3.  No Alcohol for 24 hours before or after surgery.   ___ 4.  Do Not Smoke or use e-cigarettes For 24 Hours Prior to Your Surgery.                 Do not use any chewable tobacco products for at least 6 hours prior to                 Surgery.  ___  5.  Do not use any recreational drugs (marijuana, cocaine, heroin, ecstasy,       MDMA or other) For at least one week prior to your surgery.            Combination of these drugs with anesthesia may have life threatening       results.  ____  6.  Bring all medications with you on the day of surgery if instructed.   _x___  7.  Notify your doctor if there is any change in your medical condition      (cold, fever, infections).     Do not wear jewelry,  Do not wear  lotions, You may wear deodorant. Do not shave 48 hours prior to surgery. Men may shave face and neck. Do not bring valuables to the hospital.    [facility] is not responsible for any belongings or valuables.  Contacts, dentures or bridgework may not be worn into surgery. Leave your suitcase in the car. After surgery it may be brought to your room. For patients admitted to the hospital, discharge time is determined by your treatment team.   Patients discharged the day of surgery will not be allowed to drive home.   Make arrangements for someone  to be with you for the first 24 hours of your Same Day Discharge.    Please read over the following fact sheets that you were given:       __x__ Take these medicines the morning of surgery with A SIP OF WATER:    1. busPIRone (BUSPAR) 15 MG tablet  2. fexofenadine (ALLEGRA) 180 MG tablet  3. fluticasone (FLONASE) 50 MCG/ACT nasal spray  4.  5.  6.  ____ Fleet Enema (as directed)   __x__ Use CHG Soap (or wipes) as directed  ____ Use Benzoyl Peroxide Gel as instructed  ____ Use inhalers on the day of surgery  ____ Stop metformin 2 days prior to surgery    ____ Take 1/2 of usual insulin dose the night before surgery. No insulin the morning          of surgery.   ____ Stop Coumadin/Plavix/aspirin on   __x__ Stop Anti-inflammatories meloxicam (MOBIC) 15 MG tablet, ibuprofen or aleve  May take tylenol   ___x_ Stop supplements until after surgery.  Omega-3 Fatty Acids (RA FISH OIL) 1000 MG CAPS, Ascorbic Acid (VITAMIN C) 1000 MG tablet,Black Elderberry 50 MG/5ML SYRP  ____ Bring C-Pap to the hospital.    If you have any questions regarding your pre-procedure instructions,  Please call Pre-admit Testing at [phone]

## 2021-11-26 ENCOUNTER — Encounter
Admission: RE | Admit: 2021-11-26 | Discharge: 2021-11-26 | Disposition: A | Discharge status: Home / Self Care | Payer: 59 | Source: Ambulatory Visit | Attending: Orthopedic Surgery | Admitting: Orthopedic Surgery

## 2021-11-26 DIAGNOSIS — Z01810 Encounter for preprocedural cardiovascular examination: Secondary | ICD-10-CM | POA: Diagnosis present

## 2021-11-26 DIAGNOSIS — I10 Essential (primary) hypertension: Secondary | ICD-10-CM | POA: Insufficient documentation

## 2021-11-26 MED ORDER — LACTATED RINGERS IV SOLN: INTRAVENOUS | Status: DC

## 2021-11-26 MED ORDER — CHLORHEXIDINE GLUCONATE CLOTH 2 % EX PADS: 6.0000 | MEDICATED_PAD | Freq: Once | CUTANEOUS | Status: DC

## 2021-11-26 MED ORDER — ACETAMINOPHEN 500 MG PO TABS
1000.0000 mg | ORAL_TABLET | ORAL | Status: AC
  Administered 2021-11-27: 1000 mg via ORAL

## 2021-11-26 MED ORDER — CHLORHEXIDINE GLUCONATE 0.12 % MT SOLN
15.0000 mL | Freq: Once | OROMUCOSAL | Status: AC
  Administered 2021-11-27: 15 mL via OROMUCOSAL

## 2021-11-26 MED ORDER — ORAL CARE MOUTH RINSE: 15.0000 mL | Freq: Once | OROMUCOSAL | Status: AC

## 2021-11-26 MED ORDER — CEFAZOLIN SODIUM-DEXTROSE 2-4 GM/100ML-% IV SOLN
2.0000 g | INTRAVENOUS | Status: AC
  Administered 2021-11-27: 2 g via INTRAVENOUS

## 2021-11-27 ENCOUNTER — Encounter
Admission: RE | Disposition: A | Discharge status: Home / Self Care | Payer: Self-pay | Source: Home / Self Care | Attending: Orthopedic Surgery

## 2021-11-27 ENCOUNTER — Other Ambulatory Visit: Payer: Self-pay

## 2021-11-27 ENCOUNTER — Ambulatory Visit
Admission: RE | Admit: 2021-11-27 | Discharge: 2021-11-27 | Disposition: A | Discharge status: Home / Self Care | Payer: 59 | Attending: Orthopedic Surgery | Admitting: Orthopedic Surgery

## 2021-11-27 ENCOUNTER — Encounter: Payer: Self-pay | Admitting: Orthopedic Surgery

## 2021-11-27 ENCOUNTER — Ambulatory Visit: Payer: 59 | Admitting: Certified Registered"

## 2021-11-27 DIAGNOSIS — F419 Anxiety disorder, unspecified: Secondary | ICD-10-CM | POA: Diagnosis not present

## 2021-11-27 DIAGNOSIS — E785 Hyperlipidemia, unspecified: Secondary | ICD-10-CM | POA: Insufficient documentation

## 2021-11-27 DIAGNOSIS — E669 Obesity, unspecified: Secondary | ICD-10-CM | POA: Diagnosis not present

## 2021-11-27 DIAGNOSIS — I10 Essential (primary) hypertension: Secondary | ICD-10-CM | POA: Insufficient documentation

## 2021-11-27 DIAGNOSIS — X58XXXA Exposure to other specified factors, initial encounter: Secondary | ICD-10-CM | POA: Diagnosis not present

## 2021-11-27 DIAGNOSIS — S83242A Other tear of medial meniscus, current injury, left knee, initial encounter: Secondary | ICD-10-CM | POA: Insufficient documentation

## 2021-11-27 HISTORY — PX: KNEE ARTHROSCOPY WITH MEDIAL MENISECTOMY: SHX5651

## 2021-11-27 SURGERY — ARTHROSCOPY, KNEE, WITH MEDIAL MENISCECTOMY
Anesthesia: General | Site: Knee | Laterality: Left

## 2021-11-27 MED ORDER — OXYCODONE HCL 5 MG/5ML PO SOLN: 5.0000 mg | Freq: Once | ORAL | Status: AC | PRN

## 2021-11-27 MED ORDER — CHLORHEXIDINE GLUCONATE 0.12 % MT SOLN
OROMUCOSAL | Status: AC
  Filled 2021-11-27: qty 15

## 2021-11-27 MED ORDER — ACETAMINOPHEN 10 MG/ML IV SOLN: 1000.0000 mg | Freq: Once | INTRAVENOUS | Status: DC | PRN

## 2021-11-27 MED ORDER — FENTANYL CITRATE (PF) 100 MCG/2ML IJ SOLN
INTRAMUSCULAR | Status: DC | PRN
Start: 2021-11-27 — End: 2021-11-27
  Administered 2021-11-27 (×4): 25 ug via INTRAVENOUS

## 2021-11-27 MED ORDER — MIDAZOLAM HCL 2 MG/2ML IJ SOLN
INTRAMUSCULAR | Status: DC | PRN
  Administered 2021-11-27: 2 mg via INTRAVENOUS

## 2021-11-27 MED ORDER — PROMETHAZINE HCL 25 MG/ML IJ SOLN: 6.2500 mg | INTRAMUSCULAR | Status: DC | PRN

## 2021-11-27 MED ORDER — LIDOCAINE HCL (PF) 2 % IJ SOLN
INTRAMUSCULAR | Status: AC
  Filled 2021-11-27: qty 5

## 2021-11-27 MED ORDER — ACETAMINOPHEN 500 MG PO TABS
ORAL_TABLET | ORAL | Status: AC
  Filled 2021-11-27: qty 2

## 2021-11-27 MED ORDER — GABAPENTIN 300 MG PO CAPS
300.0000 mg | ORAL_CAPSULE | Freq: Once | ORAL | Status: AC
  Administered 2021-11-27: 300 mg via ORAL

## 2021-11-27 MED ORDER — FENTANYL CITRATE (PF) 100 MCG/2ML IJ SOLN: 25.0000 ug | INTRAMUSCULAR | Status: DC | PRN

## 2021-11-27 MED ORDER — OXYCODONE HCL 5 MG PO TABS: 5.0000 mg | ORAL_TABLET | ORAL | 0 refills | Status: DC | PRN

## 2021-11-27 MED ORDER — CEFAZOLIN SODIUM-DEXTROSE 2-4 GM/100ML-% IV SOLN
INTRAVENOUS | Status: AC
  Filled 2021-11-27: qty 100

## 2021-11-27 MED ORDER — OXYCODONE HCL 5 MG PO TABS
5.0000 mg | ORAL_TABLET | Freq: Once | ORAL | Status: AC | PRN
  Administered 2021-11-27: 5 mg via ORAL

## 2021-11-27 MED ORDER — GABAPENTIN 300 MG PO CAPS
ORAL_CAPSULE | ORAL | Status: AC
  Filled 2021-11-27: qty 1

## 2021-11-27 MED ORDER — BUPIVACAINE HCL (PF) 0.25 % IJ SOLN
INTRAMUSCULAR | Status: AC
  Filled 2021-11-27: qty 30

## 2021-11-27 MED ORDER — MIDAZOLAM HCL 2 MG/2ML IJ SOLN
INTRAMUSCULAR | Status: AC
Start: 2021-11-27 — End: ?
  Filled 2021-11-27: qty 2

## 2021-11-27 MED ORDER — GLYCOPYRROLATE 0.2 MG/ML IJ SOLN
INTRAMUSCULAR | Status: DC | PRN
  Administered 2021-11-27: .2 mg via INTRAVENOUS

## 2021-11-27 MED ORDER — OXYCODONE HCL 5 MG PO TABS
ORAL_TABLET | ORAL | Status: AC
  Filled 2021-11-27: qty 1

## 2021-11-27 MED ORDER — ONDANSETRON HCL 4 MG PO TABS: 4.0000 mg | ORAL_TABLET | Freq: Three times a day (TID) | ORAL | 0 refills | Status: DC | PRN

## 2021-11-27 MED ORDER — DEXAMETHASONE SODIUM PHOSPHATE 10 MG/ML IJ SOLN
INTRAMUSCULAR | Status: DC | PRN
  Administered 2021-11-27: 10 mg via INTRAVENOUS

## 2021-11-27 MED ORDER — DROPERIDOL 2.5 MG/ML IJ SOLN: 0.6250 mg | Freq: Once | INTRAMUSCULAR | Status: DC | PRN

## 2021-11-27 MED ORDER — ASPIRIN 325 MG PO TBEC: 325.0000 mg | DELAYED_RELEASE_TABLET | Freq: Every day | ORAL | 0 refills | Status: DC

## 2021-11-27 MED ORDER — CELECOXIB 200 MG PO CAPS
200.0000 mg | ORAL_CAPSULE | Freq: Once | ORAL | Status: AC
  Administered 2021-11-27: 200 mg via ORAL

## 2021-11-27 MED ORDER — LIDOCAINE HCL (PF) 1 % IJ SOLN
INTRAMUSCULAR | Status: DC | PRN
  Administered 2021-11-27: 5 mL
  Administered 2021-11-27: 10 mL

## 2021-11-27 MED ORDER — ONDANSETRON HCL 4 MG/2ML IJ SOLN
INTRAMUSCULAR | Status: DC | PRN
  Administered 2021-11-27: 4 mg via INTRAVENOUS

## 2021-11-27 MED ORDER — LIDOCAINE HCL (PF) 1 % IJ SOLN
INTRAMUSCULAR | Status: AC
  Filled 2021-11-27: qty 30

## 2021-11-27 MED ORDER — RINGERS IRRIGATION IR SOLN
Status: DC | PRN
  Administered 2021-11-27: 3000 mL
  Administered 2021-11-27: 12000 mL

## 2021-11-27 MED ORDER — FENTANYL CITRATE (PF) 100 MCG/2ML IJ SOLN
INTRAMUSCULAR | Status: AC
  Filled 2021-11-27: qty 2

## 2021-11-27 MED ORDER — CELECOXIB 200 MG PO CAPS
ORAL_CAPSULE | ORAL | Status: AC
  Filled 2021-11-27: qty 1

## 2021-11-27 MED ORDER — PROPOFOL 10 MG/ML IV BOLUS
INTRAVENOUS | Status: AC
  Filled 2021-11-27: qty 20

## 2021-11-27 MED ORDER — BUPIVACAINE-EPINEPHRINE (PF) 0.25% -1:200000 IJ SOLN
INTRAMUSCULAR | Status: DC | PRN
  Administered 2021-11-27: 30 mL via PERINEURAL

## 2021-11-27 SURGICAL SUPPLY — 39 items
ADAPTER IRRIG TUBE 2 SPIKE SOL (ADAPTER) ×2 IMPLANT
BLADE FULL RADIUS 3.5 (BLADE) IMPLANT
BLADE SHAVER 4.5X7 STR FR (MISCELLANEOUS) IMPLANT
BUR BR 5.5 WIDE MOUTH (BURR) IMPLANT
CUFF TOURN SGL QUICK 34 (TOURNIQUET CUFF)
CUFF TRNQT CYL 34X4.125X (TOURNIQUET CUFF) IMPLANT
DRAPE ARTHRO LIMB 89X125 STRL (DRAPES) ×1 IMPLANT
DRAPE IMP U-DRAPE 54X76 (DRAPES) ×1 IMPLANT
DURAPREP 26ML APPLICATOR (WOUND CARE) ×3 IMPLANT
GAUZE SPONGE 4X4 12PLY STRL (GAUZE/BANDAGES/DRESSINGS) ×1 IMPLANT
GAUZE XEROFORM 1X8 LF (GAUZE/BANDAGES/DRESSINGS) ×1 IMPLANT
GLOVE BIOGEL PI IND STRL 9 (GLOVE) ×1 IMPLANT
GLOVE BIOGEL PI INDICATOR 9 (GLOVE) ×1
GLOVE BIOGEL PI ORTHO SZ9 (GLOVE) ×4 IMPLANT
GOWN STRL REUS W/ TWL LRG LVL3 (GOWN DISPOSABLE) ×1 IMPLANT
GOWN STRL REUS W/TWL 2XL LVL3 (GOWN DISPOSABLE) ×1 IMPLANT
GOWN STRL REUS W/TWL LRG LVL3 (GOWN DISPOSABLE) ×1
IV LACTATED RINGER IRRG 3000ML (IV SOLUTION) ×5
IV LR IRRIG 3000ML ARTHROMATIC (IV SOLUTION) ×6 IMPLANT
KIT TURNOVER KIT A (KITS) ×1 IMPLANT
MANIFOLD NEPTUNE II (INSTRUMENTS) ×2 IMPLANT
MAT ABSORB  FLUID 56X50 GRAY (MISCELLANEOUS) ×1
MAT ABSORB FLUID 56X50 GRAY (MISCELLANEOUS) ×1 IMPLANT
NEEDLE HYPO 22GX1.5 SAFETY (NEEDLE) ×1 IMPLANT
PACK ARTHROSCOPY KNEE (MISCELLANEOUS) ×1 IMPLANT
PAD ABD DERMACEA PRESS 5X9 (GAUZE/BANDAGES/DRESSINGS) ×2 IMPLANT
SHAVER BLADE TAPERED BLUNT 4 (BLADE) IMPLANT
SOL PREP PVP 2OZ (MISCELLANEOUS) ×1
SOLUTION PREP PVP 2OZ (MISCELLANEOUS) ×1 IMPLANT
SPONGE T-LAP 18X18 ~~LOC~~+RFID (SPONGE) ×1 IMPLANT
STRIP CLOSURE SKIN 1/2X4 (GAUZE/BANDAGES/DRESSINGS) ×1 IMPLANT
SUT ETHILON 4-0 (SUTURE) ×1
SUT ETHILON 4-0 FS2 18XMFL BLK (SUTURE) ×1
SUTURE ETHLN 4-0 FS2 18XMF BLK (SUTURE) ×1 IMPLANT
TRAP FLUID SMOKE EVACUATOR (MISCELLANEOUS) ×1 IMPLANT
TUBING INFLOW SET DBFLO PUMP (TUBING) ×1 IMPLANT
TUBING OUTFLOW SET DBLFO PUMP (TUBING) ×1 IMPLANT
WAND WEREWOLF FLOW 90D (MISCELLANEOUS) ×1 IMPLANT
WATER STERILE IRR 500ML POUR (IV SOLUTION) ×1 IMPLANT

## 2021-11-27 NOTE — Op Note (Addendum)
PATIENT:  Gavin Scott  PRE-OPERATIVE DIAGNOSIS: Left knee medial meniscus tear with osteoarthritis  POST-OPERATIVE DIAGNOSIS: Left knee medial meniscus tear, medial plica, extensive synovitis, chondral loose body and tricompartmental chondromalacia  PROCEDURE: Left knee arthroscopic medial meniscectomy, resection of medial plica, chondroplasty of the medial femoral condyle, trochlea and undersurface of the patella  SURGEON:  Thornton Park, MD  ANESTHESIA:   General  PREOPERATIVE INDICATIONS:  Gavin Scott  56 y.o. male with a diagnosis of left knee medial meniscus tear with osteoarthritis who failed conservative management and elected for surgical management.    The risks benefits and alternatives were discussed with the patient preoperatively including the risks of infection, bleeding, nerve injury, knee stiffness, persistent pain, osteoarthritis and the need for further surgery. Medical  risks include DVT and pulmonary embolism, myocardial infarction, stroke, pneumonia, respiratory failure and death. The patient understood these risks and wished to proceed.  OPERATIVE FINDINGS: Extensive tricompartmental chondromalacia including grade 4 chondral loss of the trochlea, grade 3 chondral wear of the medial femoral condyle, grade II chondromalacia of the medial tibial plateau and undersurface of the patella, grade I/II chondromalacia of the lateral femoral condyle and trochlea, chondral loose body seen in the lateral gutter.  Complex tear of the medial meniscus extending from the body to the posterior root.  The posterior root was not completely detached.  OPERATIVE PROCEDURE: Patient was met in the preoperative area. The operative extremity was signed with the word yes and my initials according the hospital's correct site of surgery protocol.  The patient was brought to the operating room where they was placed supine on the operative table. General anesthesia was administered. The patient was  prepped and draped in a sterile fashion.  A timeout was performed to verify the patient's name, date of birth, medical record number, correct site of surgery correct procedure to be performed. It was also used to verify the patient received antibiotics that all appropriate instruments, and radiographic studies were available in the room. Once all in attendance were in agreement, the case began.  Proposed arthroscopy incisions were drawn out with a surgical marker. These were pre-injected with 1% lidocaine plain. An 11 blade was used to establish an inferior lateral and inferomedial portals. The inferomedial portal was created using a 18-gauge spinal needle under direct visualization.  A full diagnostic examination of the knee was performed including the suprapatellar pouch, patellofemoral joint, medial lateral compartments as well as the medial lateral gutters, the intercondylar notch in the posterior knee.  Findings on arthroscopy are noted above.  Patient had extensive synovitis in the medial lateral gutters and anterior knee which was debrided with a 90 degree werewolf wand and Dyonics tapered shaver blade.  A medial plica was debrided using a 90 degree werewolf wand.  The patient then had the large complex medial meniscal tear treated with a Dyonics tapered and 3.5 full-radius shaver blades and straight duckbill basket. The meniscus was debrided until a stable rim was achieved. A chondroplasty of the medial femoral condyle, trochlea and undersurface of patella was also performed using a Dyonics tapered shaver blade.  A chondral loose body was encountered in the lateral gutter and was debrided with a Dyonics tapered shaver blade.  The knee was then copiously lavaged. All arthroscopic instruments were removed. The two arthroscopy portals were closed with 4-0 nylon. Steri-Strips were applied along with a dry sterile and compressive dressing. The patient was brought to the PACU in stable condition. I  was scrubbed and present for the  entire case and all sharp, sponge and instrument counts were correct at the conclusion the case. I spoke with the patient's wife postoperatively to let her know the case was performed without complication and the patient was stable in the recovery room.    Timoteo Gaul, MD

## 2021-11-27 NOTE — Anesthesia Procedure Notes (Signed)
Procedure Name: LMA Insertion Date/Time: 11/27/2021 12:14 PM  Performed by: Jonna Clark, CRNAPre-anesthesia Checklist: Patient identified, Patient being monitored, Timeout performed, Emergency Drugs available and Suction available Patient Re-evaluated:Patient Re-evaluated prior to induction Oxygen Delivery Method: Circle system utilized Preoxygenation: Pre-oxygenation with 100% oxygen Induction Type: IV induction Ventilation: Mask ventilation without difficulty LMA: LMA inserted LMA Size: 4.0 Tube type: Oral Number of attempts: 1 Placement Confirmation: positive ETCO2 and breath sounds checked- equal and bilateral Tube secured with: Tape Dental Injury: Teeth and Oropharynx as per pre-operative assessment

## 2021-11-27 NOTE — Anesthesia Preprocedure Evaluation (Addendum)
Anesthesia Evaluation  Patient identified by MRN, date of birth, ID band Patient awake    Reviewed: Allergy & Precautions, NPO status , Patient's Chart, lab work & pertinent test results  Airway Mallampati: III  TM Distance: >3 FB Neck ROM: full    Dental no notable dental hx.    Pulmonary neg pulmonary ROS,    Pulmonary exam normal        Cardiovascular hypertension, Pt. on medications Normal cardiovascular exam     Neuro/Psych PSYCHIATRIC DISORDERS Anxiety negative neurological ROS     GI/Hepatic Neg liver ROS, GERD  ,  Endo/Other  negative endocrine ROS  Renal/GU      Musculoskeletal  (+) Arthritis ,   Abdominal Normal abdominal exam  (+)   Peds  Hematology negative hematology ROS (+)   Anesthesia Other Findings Past Medical History: No date: Allergy No date: Anxiety 04/09/2014: Basal cell carcinoma     Comment:  Right upper arm No date: Circadian rhythm disorder No date: Hyperlipidemia No date: Hypertension No date: Metabolic syndrome No date: Obesity No date: Perforation of left tympanic membrane  Past Surgical History: 09/29/2016: COLONOSCOPY WITH PROPOFOL; N/A     Comment:  Procedure: COLONOSCOPY WITH PROPOFOL;  Surgeon: [name], MD;  Location: [facility] ENDOSCOPY;  Service:               Endoscopy;  Laterality: N/A; No date: PROSTATE BIOPSY     Comment:  [facility]-Negative  10/17/2018: PROSTATECTOMY     Comment:  Done at [facility] No date: SHOULDER ARTHROSCOPY; Right     Comment:  rotator cuff repair     Reproductive/Obstetrics negative OB ROS                           Anesthesia Physical Anesthesia Plan  ASA: 2  Anesthesia Plan: General LMA   Post-op Pain Management: Tylenol PO (pre-op)*, Gabapentin PO (pre-op)* and Celebrex PO (pre-op)*   Induction: Intravenous  PONV Risk Score and Plan: 2 and Dexamethasone, Ondansetron and  Midazolam  Airway Management Planned: LMA  Additional Equipment:   Intra-op Plan:   Post-operative Plan: Extubation in OR  Informed Consent: I have reviewed the patients History and Physical, chart, labs and discussed the procedure including the risks, benefits and alternatives for the proposed anesthesia with the patient or authorized representative who has indicated his/her understanding and acceptance.     Dental Advisory Given  Plan Discussed with: Anesthesiologist, CRNA and Surgeon  Anesthesia Plan Comments:        Anesthesia Quick Evaluation

## 2021-11-27 NOTE — Anesthesia Postprocedure Evaluation (Signed)
Anesthesia Post Note  Patient: Gavin Scott  Procedure(s) Performed: KNEE ARTHROSCOPY WITH MEDIAL MENISECTOMY (Left: Knee)  Patient location during evaluation: PACU Anesthesia Type: General Level of consciousness: awake and alert Pain management: pain level controlled Vital Signs Assessment: post-procedure vital signs reviewed and stable Respiratory status: spontaneous breathing, nonlabored ventilation and respiratory function stable Cardiovascular status: blood pressure returned to baseline and stable Postop Assessment: no apparent nausea or vomiting Anesthetic complications: no   No notable events documented.   Last Vitals:  Vitals:   11/27/21 1430 11/27/21 1446  BP: (!) 138/92 (!) 147/96  Pulse: 75   Resp: 13   Temp:  (!) 36.4 C  SpO2: 98% 100%    Last Pain:  Vitals:   11/27/21 1446  TempSrc: Temporal  PainSc: 2                  [place] Ouch

## 2021-11-27 NOTE — Discharge Instructions (Signed)

## 2021-11-27 NOTE — Transfer of Care (Signed)
Immediate Anesthesia Transfer of Care Note  Patient: Gavin Scott  Procedure(s) Performed: KNEE ARTHROSCOPY WITH MEDIAL MENISECTOMY (Left: Knee)  Patient Location: PACU  Anesthesia Type:General  Level of Consciousness: drowsy  Airway & Oxygen Therapy: Patient Spontanous Breathing and Patient connected to face mask oxygen  Post-op Assessment: Report given to RN and Post -op Vital signs reviewed and stable  Post vital signs: Reviewed and stable  Last Vitals:  Vitals Value Taken Time  BP 138/89 11/27/21 1354  Temp 36.2 C 11/27/21 1354  Pulse 81 11/27/21 1400  Resp 14 11/27/21 1400  SpO2 98 % 11/27/21 1400  Vitals shown include unvalidated device data.  Last Pain:  Vitals:   11/27/21 1024  TempSrc: Oral  PainSc: 0-No pain         Complications: No notable events documented.

## 2021-11-27 NOTE — H&P (Signed)
PREOPERATIVE H&P  Chief Complaint: Left Medial Meniscus Tear  HPI: Gavin Scott is a 56 y.o. male who presents for preoperative history and physical with a diagnosis of Left Medial Meniscus Tear confirmed by MRI. Symptoms of sided left knee pain and ankle symptoms are significantly impairing activities of daily living.  Patient has failed nonoperative management wished to proceed with a left knee arthroscopic partial medial meniscectomy.    Past Medical History:  Diagnosis Date   Allergy    Anxiety    Basal cell carcinoma 04/09/2014   Right upper arm   Circadian rhythm disorder    Hyperlipidemia    Hypertension    Metabolic syndrome    Obesity    Perforation of left tympanic membrane    Past Surgical History:  Procedure Laterality Date   COLONOSCOPY WITH PROPOFOL N/A 09/29/2016   Procedure: COLONOSCOPY WITH PROPOFOL;  Surgeon: [name], MD;  Location: [facility] ENDOSCOPY;  Service: Endoscopy;  Laterality: N/A;   PROSTATE BIOPSY     [facility]-Negative    PROSTATECTOMY  10/17/2018   Done at [facility] ARTHROSCOPY Right    rotator cuff repair   Social History   Socioeconomic History   Marital status: Married    Spouse name: Not on file   Number of children: 2   Years of education: Not on file   Highest education level: Bachelor's degree (e.g., BA, AB, BS)  Occupational History   Occupation: Customer service manager   Tobacco Use   Smoking status: Never   Smokeless tobacco: Former    Types: Snuff, Chew   Tobacco comments:    as a child   Vaping Use   Vaping Use: Never used  Substance and Sexual Activity   Alcohol use: Yes    Alcohol/week: 1.0 standard drink of alcohol    Types: 1 Cans of beer per week    Comment: occasionally   Drug use: No   Sexual activity: Yes    Partners: Female  Other Topics Concern   Not on file  Social History Narrative   Not on file   Social Determinants of Health   Financial Resource Strain: Low Risk  (05/16/2021)   Overall  Financial Resource Strain (CARDIA)    Difficulty of Paying Living Expenses: Not hard at all  Food Insecurity: No Food Insecurity (05/16/2021)   Hunger Vital Sign    Worried About Running Out of Food in the Last Year: Never true    [place] in the Last Year: Never true  Transportation Needs: No Transportation Needs (05/16/2021)   PRAPARE - Hydrologist (Medical): No    Lack of Transportation (Non-Medical): No  Physical Activity: Sufficiently Active (05/16/2021)   Exercise Vital Sign    Days of Exercise per Week: 5 days    Minutes of Exercise per Session: 60 min  Stress: No Stress Concern Present (05/16/2021)   [place]    Feeling of Stress : Only a little  Social Connections: Socially Integrated (05/16/2021)   Social Connection and Isolation Panel [NHANES]    Frequency of Communication with Friends and Family: Three times a week    Frequency of Social Gatherings with Friends and Family: Three times a week    Attends Religious Services: 1 to 4 times per year    Active Member of Clubs or Organizations: Yes    Attends Archivist Meetings: 1 to 4 times per year  Marital Status: Married   Family History  Problem Relation Age of Onset   Stroke Mother    Hyperlipidemia Mother    Alcohol abuse Father    Stroke Father    Hyperlipidemia Father    Dementia Father    Allergies  Allergen Reactions   Ace Inhibitors Cough   Codeine Hives and Itching   Prior to Admission medications   Medication Sig Start Date End Date Taking? Authorizing Provider  Ascorbic Acid (VITAMIN C) 1000 MG tablet Take 1 tablet by mouth.   Yes [provider]  Black Elderberry 50 MG/5ML SYRP Take 5 mLs by mouth daily. 08/18/19  Yes Sowles, Drue Stager, MD  busPIRone (BUSPAR) 15 MG tablet TAKE 1 TABLET(15 MG) BY MOUTH TWICE DAILY 11/04/21  Yes Sowles, Drue Stager, MD  meloxicam (MOBIC) 15 MG tablet TAKE 1 TABLET  BY MOUTH DAILY 08/14/21  Yes Sowles, Drue Stager, MD  Omega-3 Fatty Acids (RA FISH OIL) 1000 MG CAPS Take 1 capsule by mouth daily.    Yes [provider]  telmisartan (MICARDIS) 80 MG tablet Take 1 tablet (80 mg total) by mouth daily. 11/04/21  Yes Sowles, Drue Stager, MD  atorvastatin (LIPITOR) 40 MG tablet TAKE 1 TABLET(40 MG) BY MOUTH DAILY 11/04/21   Steele Sizer, MD  fexofenadine (ALLEGRA) 180 MG tablet Take by mouth.    [provider]  fluticasone (FLONASE) 50 MCG/ACT nasal spray Place 2 sprays into both nostrils daily. 11/04/21   Steele Sizer, MD  montelukast (SINGULAIR) 10 MG tablet TAKE 1 TABLET(10 MG) BY MOUTH AT BEDTIME 11/04/21   Sowles, Drue Stager, MD  Papav-Phentolamine-Alprostadil (TRI-MIX IC) 1 each by Intracavernosal route daily as needed.    Freeman Caldron, NP  zolpidem (AMBIEN) 10 MG tablet Take 1 tablet (10 mg total) by mouth at bedtime as needed for sleep. 09/15/19 10/15/19  Steele Sizer, MD     Positive ROS: All other systems have been reviewed and were otherwise negative with the exception of those mentioned in the HPI and as above.  Physical Exam: General: Alert, no acute distress Cardiovascular: Regular rate and rhythm, no murmurs rubs or gallops.  No pedal edema Respiratory: Clear to auscultation bilaterally, no wheezes rales or rhonchi. No cyanosis, no use of accessory musculature GI: No organomegaly, abdomen is soft and non-tender nondistended with positive bowel sounds. Skin: Skin intact, no lesions within the operative field. Neurologic: Sensation intact distally Psychiatric: Patient is competent for consent with normal mood and affect Lymphatic: No cervical lymphadenopathy  MUSCULOSKELETAL: Left knee: Patient's skin is intact. There is no erythema, ecchymosis, or large effusion. He had tenderness over the medial femoral condyle to palpation and had medial joint line tenderness within a equivocal McMurray's test. He had no ligamentous laxity. He has  no significant patellofemoral crepitus, grind or apprehension. He had no calf tenderness, no lower leg edema and is distally neurovascularly intact.  Radiology: I reviewed the MRI images as well as the radiology report. Patient has increased edema of the medial femoral condyle consistent with a stress fracture. There was no cruciate or collateral ligament tear. There was a complex tear involving the posterior horn and body of the medial meniscus. He had mild to moderate patellofemoral arthropathy.   Assessment: Left Medial Meniscus Tear  Plan: Plan for Procedure(s): LEFT KNEE ARTHROSCOPIC PARTIAL MEDIAL MENISECTOMY  I reviewed the details of the operation as well as the postoperative course with the patient and his wife who was at the bedside this morning.  I marked the left knee according  hospital's correct site of surgery protocol after verbally confirming with the patient that this was the correct knee.  A preop history and physical was performed at the bedside.  I discussed the risks and benefits of surgery. The risks include but are not limited to infection, bleeding , nerve or blood vessel injury, joint stiffness or loss of motion, persistent pain, weakness or instability, retear of the medial meniscus, osteoarthritis and the need for further surgery. Patient understood these risks and wished to proceed.     Thornton Park, MD   11/27/2021 11:58 AM

## 2021-11-28 ENCOUNTER — Encounter: Payer: Self-pay | Admitting: Orthopedic Surgery

## 2022-01-05 ENCOUNTER — Other Ambulatory Visit: Payer: Self-pay | Admitting: Family Medicine

## 2022-01-05 DIAGNOSIS — M19041 Primary osteoarthritis, right hand: Secondary | ICD-10-CM

## 2022-01-16 ENCOUNTER — Ambulatory Visit: Payer: 59 | Admitting: Internal Medicine

## 2022-01-16 ENCOUNTER — Ambulatory Visit (INDEPENDENT_AMBULATORY_CARE_PROVIDER_SITE_OTHER): Payer: 59 | Admitting: Internal Medicine

## 2022-01-16 ENCOUNTER — Ambulatory Visit: Payer: Self-pay

## 2022-01-16 ENCOUNTER — Encounter: Payer: Self-pay | Admitting: Internal Medicine

## 2022-01-16 DIAGNOSIS — R0981 Nasal congestion: Secondary | ICD-10-CM

## 2022-01-16 DIAGNOSIS — U071 COVID-19: Secondary | ICD-10-CM | POA: Diagnosis not present

## 2022-01-16 DIAGNOSIS — R051 Acute cough: Secondary | ICD-10-CM

## 2022-01-16 MED ORDER — NIRMATRELVIR/RITONAVIR (PAXLOVID)TABLET: 3.0000 | ORAL_TABLET | Freq: Two times a day (BID) | ORAL | 0 refills | Status: AC

## 2022-01-16 MED ORDER — FLUTICASONE PROPIONATE 50 MCG/ACT NA SUSP: 2.0000 | Freq: Every day | NASAL | 1 refills | Status: DC

## 2022-01-16 MED ORDER — BENZONATATE 100 MG PO CAPS: 100.0000 mg | ORAL_CAPSULE | Freq: Two times a day (BID) | ORAL | 0 refills | Status: DC | PRN

## 2022-01-16 NOTE — Telephone Encounter (Signed)
Summary: covid positive   Pt is covid positive. He can tell he has a fever. Body aches, sinus issues, and he is hoping to get paxlovid.      Chief Complaint: cough Symptoms: covid pos, fatigue, sore throat,  greenish brown phlegm, subjective fever, post nasal drip  Frequency: day 0 Wednesday Pertinent Negatives: Patient denies SOB chest pain or difficulty breathing Disposition: '[]'$ ED /'[]'$ Urgent Care (no appt availability in office) / '[x]'$ Appointment(In office/virtual)/ '[]'$  [facility]/ '[]'$ Home Care/ '[]'$ Refused Recommended Disposition /'[]'$ [facility]/ '[]'$  Follow-up with PCP Additional Notes: Melissa assisted by making phone appt for pt. Called pt back and time given to pr for a phone appt.  Reason for Disposition  Coughing up rusty-colored (reddish-brown) sputum  Answer Assessment - Initial Assessment Questions 1. ONSET: "When did the cough begin?"      Wednesday 2. SEVERITY: "How bad is the cough today?"      Productive cough 3. SPUTUM: "Describe the color of your sputum" (none, dry cough; clear, white, yellow, green)     Greenish brown 4. HEMOPTYSIS: "Are you coughing up any blood?" If so ask: "How much?" (flecks, streaks, tablespoons, etc.)     no 5. DIFFICULTY BREATHING: "Are you having difficulty breathing?" If Yes, ask: "How bad is it?" (e.g., mild, moderate, severe)    - MILD: No SOB at rest, mild SOB with walking, speaks normally in sentences, can lie down, no retractions, pulse < 100.    - MODERATE: SOB at rest, SOB with minimal exertion and prefers to sit, cannot lie down flat, speaks in phrases, mild retractions, audible wheezing, pulse 100-120.    - SEVERE: Very SOB at rest, speaks in single words, struggling to breathe, sitting hunched forward, retractions, pulse > 120      normal 6. FEVER: "Do you have a fever?" If Yes, ask: "What is your temperature, how was it measured, and when did it start?"     Subjective fever 7. CARDIAC HISTORY: "Do you have any  history of heart disease?" (e.g., heart attack, congestive heart failure)      no 8. LUNG HISTORY: "Do you have any history of lung disease?"  (e.g., pulmonary embolus, asthma, emphysema)     no 9. PE RISK FACTORS: "Do you have a history of blood clots?" (or: recent major surgery, recent prolonged travel, bedridden)     no 10. OTHER SYMPTOMS: "Do you have any other symptoms?" (e.g., runny nose, wheezing, chest pain)       Post nasal drip, covid +, fatigue, sore throat 11. PREGNANCY: "Is there any chance you are pregnant?" "When was your last menstrual period?"       N/a 12. TRAVEL: "Have you traveled out of the country in the last month?" (e.g., travel history, exposures)       no  Answer Assessment - Initial Assessment Questions 1. COVID-19 DIAGNOSIS: "How do you know that you have COVID?" (e.g., positive lab test or self-test, diagnosed by doctor or NP/PA, symptoms after exposure).     Self test 2. COVID-19 EXPOSURE: "Was there any known exposure to COVID before the symptoms began?" CDC Definition of close contact: within 6 feet (2 meters) for a total of 15 minutes or more over a 24-hour period.      none 3. ONSET: "When did the COVID-19 symptoms start?"      yesterday 4. WORST SYMPTOM: "What is your worst symptom?" (e.g., cough, fever, shortness of breath, muscle aches)     Post nasal drip last  night and cough 5. COUGH: "Do you have a cough?" If Yes, ask: "How bad is the cough?"       Yes with phlegm greenish brown 6. FEVER: "Do you have a fever?" If Yes, ask: "What is your temperature, how was it measured, and when did it start?"     Subjective fever 7. RESPIRATORY STATUS: "Describe your breathing?" (e.g., normal; shortness of breath, wheezing, unable to speak)      normal 8. BETTER-SAME-WORSE: "Are you getting better, staying the same or getting worse compared to yesterday?"  If getting worse, ask, "In what way?"     Worse- sx worse 9. OTHER SYMPTOMS: "Do you have any other  symptoms?"  (e.g., chills, fatigue, headache, loss of smell or taste, muscle pain, sore throat)     Sore throat, cough, PND, muscle pain, chills, fatigue 10. HIGH RISK DISEASE: "Do you have any chronic medical problems?" (e.g., asthma, heart or lung disease, weak immune system, obesity, etc.)       no 11. VACCINE: "Have you had the COVID-19 vaccine?" If Yes, ask: "Which one, how many shots, when did you get it?"       Yes- 2 shots 12. PREGNANCY: "Is there any chance you are pregnant?" "When was your last menstrual period?"       N/a 13. O2 SATURATION MONITOR:  "Do you use an oxygen saturation monitor (pulse oximeter) at home?" If Yes, ask "What is your reading (oxygen level) today?" "What is your usual oxygen saturation reading?" (e.g., 95%)       N/a  Protocols used: Coronavirus (COVID-19) Diagnosed or Suspected-A-AH, Cough - Acute Productive-A-AH

## 2022-01-16 NOTE — Progress Notes (Signed)
Virtual Visit via Telephone Note  I connected with Gavin Scott on 01/16/22 at  9:20 AM EDT by telephone and verified that I am speaking with the correct person using two identifiers.  Location: Patient: Home Provider: [facility]   I discussed the limitations, risks, security and privacy concerns of performing an evaluation and management service by telephone and the availability of in person appointments. I also discussed with the patient that there may be a patient responsible charge related to this service. The patient expressed understanding and agreed to proceed.   History of Present Illness:  Patient presenting via telephone for home positive COVID test.  He tested positive this morning.  Symptoms started 2 days ago on 01/14/2022.  Patient states he was in [place] for work earlier this week and was in the airport for a long amount of time and could have also had a possible sick contact.  He did have COVID in January and was treated with antiviral therapy and did well with this.  -Worst symptom:congestion -Fever: no -Cough: yes, productive green  -Shortness of breath: no -Wheezing: no -Chest congestion: no -Nasal congestion: yes -Runny nose: yes -Post nasal drip: yes -Sore throat: yes -Sinus pressure: no -Headache: no -Face pain: no -Myalgias: Yes -Vomiting: no -Fatigue: yes -Sick contacts: yes and was traveling recently -Context: stable -Treatments attempted: Coricidin, Tylenol/ibuprofen  Observations/Objective:  General: well sounding  Neuro: Answers all questions appropriately   Assessment and Plan:  1. COVID-19/Nasal congestion/Acute cough: COVID-positive with home test this morning, symptoms started 2 days ago.  Will treat again with Paxlovid, GFR appropriate.  We will also treat symptomatically with nasal steroid and cough suppressant.  He will continue to take the Coricidin decongestant for high blood pressure at home.  Discussed quarantining and/or wearing a mask for 10  days from onset of symptoms, end date 01/24/2022.  If patient's symptoms worsen or fail to improve he will follow up.   - nirmatrelvir/ritonavir EUA (PAXLOVID) 20 x 150 MG & 10 x '100MG'$  TABS; Take 3 tablets by mouth 2 (two) times daily for 5 days. (Take nirmatrelvir 150 mg two tablets twice daily for 5 days and ritonavir 100 mg one tablet twice daily for 5 days) Patient GFR is 105  Dispense: 30 tablet; Refill: 0 - fluticasone (FLONASE) 50 MCG/ACT nasal spray; Place 2 sprays into both nostrils daily.  Dispense: 48 g; Refill: 1 - benzonatate (TESSALON) 100 MG capsule; Take 1 capsule (100 mg total) by mouth 2 (two) times daily as needed for cough.  Dispense: 20 capsule; Refill: 0  Follow Up Instructions: As needed or if symptoms worsen or fail to improve.    I discussed the assessment and treatment plan with the patient. The patient was provided an opportunity to ask questions and all were answered. The patient agreed with the plan and demonstrated an understanding of the instructions.   The patient was advised to call back or seek an in-person evaluation if the symptoms worsen or if the condition fails to improve as anticipated.  I provided 10 minutes of non-face-to-face time during this encounter.   Teodora Medici, DO

## 2022-02-04 ENCOUNTER — Other Ambulatory Visit: Payer: Self-pay | Admitting: Family Medicine

## 2022-02-04 DIAGNOSIS — M19041 Primary osteoarthritis, right hand: Secondary | ICD-10-CM

## 2022-03-11 ENCOUNTER — Other Ambulatory Visit: Payer: Self-pay | Admitting: Family Medicine

## 2022-03-11 DIAGNOSIS — M19041 Primary osteoarthritis, right hand: Secondary | ICD-10-CM

## 2022-03-27 ENCOUNTER — Other Ambulatory Visit: Payer: Self-pay

## 2022-03-27 ENCOUNTER — Encounter: Payer: Self-pay | Admitting: Nurse Practitioner

## 2022-03-27 ENCOUNTER — Ambulatory Visit (INDEPENDENT_AMBULATORY_CARE_PROVIDER_SITE_OTHER): Payer: 59 | Admitting: Nurse Practitioner

## 2022-03-27 VITALS — BP 128/84 | HR 81 | Temp 98.2°F | Resp 18 | Ht 73.0 in | Wt 223.2 lb

## 2022-03-27 DIAGNOSIS — J0141 Acute recurrent pansinusitis: Secondary | ICD-10-CM | POA: Diagnosis not present

## 2022-03-27 MED ORDER — AZITHROMYCIN 250 MG PO TABS: ORAL_TABLET | ORAL | 0 refills | Status: AC

## 2022-03-27 MED ORDER — BENZONATATE 100 MG PO CAPS: 200.0000 mg | ORAL_CAPSULE | Freq: Two times a day (BID) | ORAL | 0 refills | Status: DC | PRN

## 2022-03-27 NOTE — Progress Notes (Signed)
BP 128/84   Pulse 81   Temp 98.2 F (36.8 C) (Oral)   Resp 18   Ht _0  (1.854 m)   Wt 223 lb 3.2 oz (101.2 kg)   SpO2 97%   BMI 29.45 kg/m    Subjective:    Patient ID: Gavin Scott, male    DOB: 1965/07/19, 56 y.o.   MRN: [id number]  HPI: Gavin Scott is a 56 y.o. male  Chief Complaint  Patient presents with   Sinusitis    Congestion, headache for 3 days. Covid test today negative   Sinusitis: Symptoms started three days ago.  Symptoms include nasal congestion, sinus headache, cough.  He reports he has taken coricidin  for symptoms.  He says he tested for covid and it was negative. He denies any fever or shortness of breath.  He says he often gets sinus infections this time of year.  He says azithromycin is what helps him.  Likely his symptoms are viral, recommend holding off on starting antibiotics for a week if no improvement. Prescription given for tessalon perls and azithromycin.  Recommend taking vitamin c, vitamin d and zinc. Claritin, flonase and mucinex.   Relevant past medical, surgical, family and social history reviewed and updated as indicated. Interim medical history since our last visit reviewed. Allergies and medications reviewed and updated.  Review of Systems  Constitutional: Negative for fever or weight change.  HEENT: positive for nasal congestion Respiratory: positive  for cough and  negative for shortness of breath.   Cardiovascular: Negative for chest pain or palpitations.  Gastrointestinal: Negative for abdominal pain, no bowel changes.  Musculoskeletal: Negative for gait problem or joint swelling.  Skin: Negative for rash.  Neurological: Negative for dizziness, positive for  headache.  No other specific complaints in a complete review of systems (except as listed in HPI above).      Objective:    BP 128/84   Pulse 81   Temp 98.2 F (36.8 C) (Oral)   Resp 18   Ht _1  (1.854 m)   Wt 223 lb 3.2 oz (101.2 kg)   SpO2 97%   BMI 29.45 kg/m    Wt Readings from Last 3 Encounters:  03/27/22 223 lb 3.2 oz (101.2 kg)  11/27/21 205 lb (93 kg)  11/25/21 205 lb (93 kg)    Physical Exam  Constitutional: Patient appears well-developed and well-nourished.  No distress.  HEENT: head atraumatic, normocephalic, pupils equal and reactive to light, ears TMs clear, neck supple, throat within normal limits Cardiovascular: Normal rate, regular rhythm and normal heart sounds.  No murmur heard. No BLE edema. Pulmonary/Chest: Effort normal and breath sounds normal. No respiratory distress. Abdominal: Soft.  There is no tenderness. Psychiatric: Patient has a normal mood and affect. behavior is normal. Judgment and thought content normal.  Results for orders placed or performed in visit on 05/16/21  CBC with Differential/Platelet  Result Value Ref Range   WBC 4.6 3.8 - 10.8 Thousand/uL   RBC 5.19 4.20 - 5.80 Million/uL   Hemoglobin 15.3 13.2 - 17.1 g/dL   HCT 45.9 38.5 - 50.0 %   MCV 88.4 80.0 - 100.0 fL   MCH 29.5 27.0 - 33.0 pg   MCHC 33.3 32.0 - 36.0 g/dL   RDW 12.0 11.0 - 15.0 %   Platelets 244 140 - 400 Thousand/uL   MPV 10.4 7.5 - 12.5 fL   Neutro Abs 2,507 1,500 - 7,800 cells/uL   Lymphs Abs 1,532 850 - 3,900  cells/uL   Absolute Monocytes 492 200 - 950 cells/uL   Eosinophils Absolute 51 15 - 500 cells/uL   Basophils Absolute 18 0 - 200 cells/uL   Neutrophils Relative % 54.5 %   Total Lymphocyte 33.3 %   Monocytes Relative 10.7 %   Eosinophils Relative 1.1 %   Basophils Relative 0.4 %  COMPLETE METABOLIC PANEL WITH GFR  Result Value Ref Range   Glucose, Bld 89 65 - 99 mg/dL   BUN 21 7 - 25 mg/dL   Creat 0.79 0.70 - 1.30 mg/dL   eGFR 105 > OR = 60 mL/min/1.54m   BUN/Creatinine Ratio NOT APPLICABLE 6 - 22 (calc)   Sodium 139 135 - 146 mmol/L   Potassium 5.0 3.5 - 5.3 mmol/L   Chloride 104 98 - 110 mmol/L   CO2 30 20 - 32 mmol/L   Calcium 9.5 8.6 - 10.3 mg/dL   Total Protein 6.9 6.1 - 8.1 g/dL   Albumin 4.7 3.6 - 5.1 g/dL    Globulin 2.2 1.9 - 3.7 g/dL (calc)   AG Ratio 2.1 1.0 - 2.5 (calc)   Total Bilirubin 0.5 0.2 - 1.2 mg/dL   Alkaline phosphatase (APISO) 51 35 - 144 U/L   AST 20 10 - 35 U/L   ALT 30 9 - 46 U/L  Lipid panel  Result Value Ref Range   Cholesterol 125 <200 mg/dL   HDL 36 (L) > OR = 40 mg/dL   Triglycerides 165 (H) <150 mg/dL   LDL Cholesterol (Calc) 65 mg/dL (calc)   Total CHOL/HDL Ratio 3.5 <5.0 (calc)   Non-HDL Cholesterol (Calc) 89 <130 mg/dL (calc)  Hemoglobin A1c  Result Value Ref Range   Hgb A1c MFr Bld 5.5 <5.7 % of total Hgb   Mean Plasma Glucose 111 mg/dL   eAG (mmol/L) 6.2 mmol/L      Assessment & Plan:   Problem List Items Addressed This Visit   None Visit Diagnoses     Acute recurrent pansinusitis    -  Primary   push fluids, take claritin, flonase and mucinex.  start azithromycin if no improvement.   Relevant Medications   azithromycin (ZITHROMAX) 250 MG tablet   benzonatate (TESSALON) 100 MG capsule        Follow up plan: Return if symptoms worsen or fail to improve.

## 2022-04-20 ENCOUNTER — Other Ambulatory Visit: Payer: Self-pay | Admitting: Family Medicine

## 2022-04-20 DIAGNOSIS — M19041 Primary osteoarthritis, right hand: Secondary | ICD-10-CM

## 2022-05-04 ENCOUNTER — Ambulatory Visit: Payer: 59 | Admitting: Family Medicine

## 2022-05-28 NOTE — Patient Instructions (Signed)

## 2022-05-28 NOTE — Progress Notes (Signed)
Name: Gavin Scott   MRN: [id number]    DOB: 01-Apr-1966   Date:05/29/2022       Progress Note  Subjective  Chief Complaint  Annual Exam  HPI  Patient presents for annual CPE and follow up  HTN: BP is towards low end of normal, but at home it has been well controlled at 120's/80's, no chest pain, palpitation or SOB, he denies dizziness    Hyperlipidemia: he eats healthy but not very physically active due to distal femur stress fracture and knee surgery done 11/2021 . Last LDL was 65, he had an average risk of heart disease based on cardiac calcium score done  05/2021     Insomnia: only takes medication Ambien prn , mostly when he used to travel, he states no longer traveling to [place] and no longer needs rx.    Depression/GAD: he states mother is now living with his sister in [place] and doing well at this time. He is doing well at this time    History of prostatectomy:10/2017  doing well, it was for prostate cancer, but seeing urologist and last PSA was 11/2021 and normal.  He still has ED using Tri-mix, also doing kegel exercise prn for urinary incontinence, symptoms are not severe    Hyperglycemia: he denies polyphagia, polydipsia or polyuria. , eating healthier .Last A1C down to 5.3 %, in the past as high as 6.1 % . He has not been as active    GERD: he states symptoms are stable at this time    Family history of dementia: mother, father had dementia.  Parents started to have symptoms in their 43's. Neither one diagnosed with Alzheimer's , mother had a stroke and her memory has declined since. We will refer him to Dr. Manuella Ghazi    IPSS Questionnaire (AUA-7): Over the past month.   1)  How often have you had a sensation of not emptying your bladder completely after you finish urinating?  1 - Less than 1 time in 5  2)  How often have you had to urinate again less than two hours after you finished urinating? 0 - Not at all  3)  How often have you found you stopped and started again  several times when you urinated?  1 - Less than 1 time in 5  4) How difficult have you found it to postpone urination?  0 - Not at all  5) How often have you had a weak urinary stream?  0 - Not at all  6) How often have you had to push or strain to begin urination?  0 - Not at all  7) How many times did you most typically get up to urinate from the time you went to bed until the time you got up in the morning?  1 - 1 time  Total score:  0-7 mildly symptomatic   8-19 moderately symptomatic   20-35 severely symptomatic     Diet: balanced diet  Exercise: not as active as he used to be, but will try to modify physical activity  Last Dental Exam: up to date  Last Eye Exam: up to date   Depression: phq 9 is negative    05/29/2022    7:38 AM 03/27/2022    1:38 PM 01/16/2022    9:04 AM 10/30/2021    2:57 PM 05/16/2021    9:10 AM  Depression screen PHQ 2/9  Decreased Interest 0 0 0 0 0  Down, Depressed, Hopeless 0 0 0  0 0  PHQ - 2 Score 0 0 0 0 0  Altered sleeping 0  0 0 0  Tired, decreased energy 0  0 0 0  Change in appetite 0  0 0 0  Feeling bad or failure about yourself  0  0 0 0  Trouble concentrating 0  0 0 0  Moving slowly or fidgety/restless 0  0 0 0  Suicidal thoughts 0  0 0 0  PHQ-9 Score 0  0 0 0  Difficult doing work/chores   Not difficult at all  Not difficult at all    Hypertension:  BP Readings from Last 3 Encounters:  05/29/22 116/72  03/27/22 128/84  11/27/21 (!) 147/96    Obesity: Wt Readings from Last 3 Encounters:  05/29/22 225 lb (102.1 kg)  03/27/22 223 lb 3.2 oz (101.2 kg)  11/27/21 205 lb (93 kg)   BMI Readings from Last 3 Encounters:  05/29/22 29.69 kg/m  03/27/22 29.45 kg/m  11/27/21 27.05 kg/m     Lipids:  Lab Results  Component Value Date   CHOL 125 05/16/2021   CHOL 123 12/23/2020   CHOL 127 02/02/2020   Lab Results  Component Value Date   HDL 36 (L) 05/16/2021   HDL 40 12/23/2020   HDL 37 (L) 02/02/2020   Lab Results  Component  Value Date   LDLCALC 65 05/16/2021   LDLCALC 59 12/23/2020   LDLCALC 66 02/02/2020   Lab Results  Component Value Date   TRIG 165 (H) 05/16/2021   TRIG 153 (H) 12/23/2020   TRIG 158 (H) 02/02/2020   Lab Results  Component Value Date   CHOLHDL 3.5 05/16/2021   CHOLHDL 3.1 12/23/2020   CHOLHDL 3.4 02/02/2020   No results found for: "LDLDIRECT" Glucose:  Glucose, Bld  Date Value Ref Range Status  05/16/2021 89 65 - 99 mg/dL Final    Comment:    .            Fasting reference interval .   12/23/2020 88 65 - 99 mg/dL Final    Comment:    .            Fasting reference interval .   02/02/2020 82 65 - 99 mg/dL Final    Comment:    .            Fasting reference interval .     [place] Office Visit from [facility]/27/2022 in [facility]  AUDIT-C Score 0       Married STD testing and prevention (HIV/chl/gon/syphilis): N/A Sexual history: one partner, uses Trimix and adjusting dose with urologist  Hep C Screening: 05/18/18 Skin cancer: Discussed monitoring for atypical lesions Colorectal cancer: 09/29/16 Prostate cancer:  seeing Urologist    Lung cancer:  Low Dose CT Chest recommended if Age 60-80 years, 30 pack-year currently smoking OR have quit w/in 15years. Patient  no a candidate for screening   AAA: The USPSTF recommends one-time screening with ultrasonography in men ages 38 to 63 years who have ever smoked. Patient   no, a candidate for screening  ECG:  11/28/21  Vaccines:   Tdap: up to date Shingrix: up to date Pneumonia: N/A Flu: up to date COVID-55: up to date  Advanced Care Planning: A voluntary discussion about advance care planning including the explanation and discussion of advance directives.  Discussed health care proxy and Living will, and the patient was able to identify a health care proxy as wife.  Patient  has living will and power of attorney of health care   Patient Active Problem List   Diagnosis Date Noted    Patellar tendinitis of left knee 10/30/2021   Osteoarthritis of fingers of both hands 10/30/2021   History of prostate cancer 10/30/2021   Hydrocele in adult 05/16/2021   Rectal polyp    Benign neoplasm of ascending colon    GERD without esophagitis 08/19/2015   Generalized anxiety disorder 12/18/2014   Basal cell carcinoma 12/18/2014   Hypertension, benign 12/18/2014   Circadian rhythm sleep disorder 12/18/2014   Dyslipidemia 12/18/2014   Perennial allergic rhinitis with seasonal variation 12/18/2014   Ear drum perforation 12/18/2014   Motion sickness 12/18/2014   Engages in travel abroad 99991111   Dysmetabolic syndrome [id number]    Past Surgical History:  Procedure Laterality Date   COLONOSCOPY WITH PROPOFOL N/A 09/29/2016   Procedure: COLONOSCOPY WITH PROPOFOL;  Surgeon: [name], MD;  Location[facility] END[facility]COPY;  Service: Endoscopy;  Laterality: N/A;   KNEE ARTHROSCOPY WITH MEDIAL MENISECTOMY Left 11/27/2021   Procedure: KNEE ARTHROSCOPY WITH MEDIAL MENISECTOMY;  Surgeon: Thornton Park, MD;  Location: [facility] ORS;  Service: Orthopedics;  Laterality: Left;   PROSTATE BIOPSY     [facility]-Negative    PROSTATECTOMY  10/17/2018   Done at [place] ARTHROSCOPY Right    rotator cuff repair    Family History  Problem Relation Age of Onset   Stroke Mother    Hyperlipidemia Mother    Alcohol abuse Father    Stroke Father    Hyperlipidemia Father    Dementia Father     Social History   Socioeconomic History   Marital status: Married    Spouse name: Not on file   Number of children: 2   Years of education: Not on file   Highest education level: Bachelor's degree (e.g., BA, AB, BS)  Occupational History   Occupation: Customer service manager   Tobacco Use   Smoking status: Never   Smokeless tobacco: Former    Types: Snuff, Chew   Tobacco comments:    as a child   Vaping Use   Vaping Use: Never used  Substance and Sexual Activity   Alcohol use:  Yes    Alcohol/week: 1.0 standard drink of alcohol    Types: 1 Cans of beer per week    Comment: occasionally   Drug use: No   Sexual activity: Yes    Partners: Female  Other Topics Concern   Not on file  Social History Narrative   Not on file   Social Determinants of Health   Financial Resource Strain: Low Risk  (05/29/2022)   Overall Financial Resource Strain (CARDIA)    Difficulty of Paying Living Expenses: Not hard at all  Food Insecurity: No Food Insecurity (05/29/2022)   Hunger Vital Sign    Worried About Running Out of Food in the Last Year: Never true    Ran Out of Food in the Last Year: Never true  Transportation Needs: No Transportation Needs (05/29/2022)   PRAPARE - Hydrologist (Medical): No    Lack of Transportation (Non-Medical): No  Physical Activity: Insufficiently Active (05/29/2022)   Exercise Vital Sign    Days of Exercise per Week: 2 days    Minutes of Exercise per Session: 20 min  Stress: No Stress Concern Present (05/29/2022)   [place]    Feeling of Stress : Only a little  Social Connections: Moderately Integrated (05/29/2022)   Social Connection and Isolation Panel [NHANES]    Frequency of Communication with Friends and Family: More than three times a week    Frequency of Social Gatherings with Friends and Family: Twice a week    Attends Religious Services: More than 4 times per year    Active Member of [employer] or Organizations: No    Attends Archivist Meetings: Never    Marital Status: Married  Human resources officer Violence: Not At Risk (05/29/2022)   Humiliation, Afraid, Rape, and Kick questionnaire    Fear of Current or Ex-Partner: No    Emotionally Abused: No    Physically Abused: No    Sexually Abused: No     Current Outpatient Medications:    Ascorbic Acid (VITAMIN C) 1000 MG tablet, Take 1 tablet by mouth., Disp: , Rfl:    aspirin EC 325 MG  tablet, Take 1 tablet (325 mg total) by mouth daily., Disp: 45 tablet, Rfl: 0   atorvastatin (LIPITOR) 40 MG tablet, TAKE 1 TABLET(40 MG) BY MOUTH DAILY, Disp: 90 tablet, Rfl: 1   benzonatate (TESSALON) 100 MG capsule, Take 2 capsules (200 mg total) by mouth 2 (two) times daily as needed for cough., Disp: 20 capsule, Rfl: 0   Black Elderberry 50 MG/5ML SYRP, Take 5 mLs by mouth daily., Disp: 240 mL, Rfl: 0   busPIRone (BUSPAR) 15 MG tablet, TAKE 1 TABLET(15 MG) BY MOUTH TWICE DAILY, Disp: 180 tablet, Rfl: 1   fexofenadine (ALLEGRA) 180 MG tablet, Take by mouth., Disp: , Rfl:    fluticasone (FLONASE) 50 MCG/ACT nasal spray, Place 2 sprays into both nostrils daily., Disp: 48 g, Rfl: 1   meloxicam (MOBIC) 15 MG tablet, TAKE 1 TABLET BY MOUTH DAILY, Disp: 30 tablet, Rfl: 0   montelukast (SINGULAIR) 10 MG tablet, TAKE 1 TABLET(10 MG) BY MOUTH AT BEDTIME, Disp: 90 tablet, Rfl: 1   Omega-3 Fatty Acids (RA FISH OIL) 1000 MG CAPS, Take 1 capsule by mouth daily. , Disp: , Rfl:    Papav-Phentolamine-Alprostadil (TRI-MIX IC), 1 each by Intracavernosal route daily as needed., Disp: , Rfl:    telmisartan (MICARDIS) 80 MG tablet, Take 1 tablet (80 mg total) by mouth daily., Disp: 90 tablet, Rfl: 1   zolpidem (AMBIEN) 10 MG tablet, Take 1 tablet (10 mg total) by mouth at bedtime as needed for sleep., Disp: 30 tablet, Rfl: 0  Allergies  Allergen Reactions   Ace Inhibitors Cough   Codeine Hives and Itching     ROS  Constitutional: Negative for fever or weight change.  Respiratory: Negative for cough and shortness of breath.   Cardiovascular: Negative for chest pain or palpitations.  Gastrointestinal: Negative for abdominal pain, no bowel changes.  Musculoskeletal: Negative for gait problem or joint swelling.  Skin: Negative for rash.  Neurological: Negative for dizziness or headache.  No other specific complaints in a complete review of systems (except as listed in HPI above).    Objective  Vitals:    05/29/22 0739  BP: 116/72  Pulse: 87  Resp: 16  SpO2: 98%  Weight: 225 lb (102.1 kg)  Height: 6' 1"$  (1.854 m)    Body mass index is 29.69 kg/m.  Physical Exam  Constitutional: Patient appears well-developed and well-nourished. No distress.  HENT: Head: Normocephalic and atraumatic. Ears: B TMs ok, no erythema or effusion; Nose: Nose normal. Mouth/Throat: Oropharynx is clear and moist. No oropharyngeal exudate.  Eyes: Conjunctivae and EOM are normal. Pupils are equal,  round, and reactive to light. No scleral icterus.  Neck: Normal range of motion. Neck supple. No JVD present. No thyromegaly present.  Cardiovascular: Normal rate, regular rhythm and normal heart sounds.  No murmur heard. No BLE edema. Pulmonary/Chest: Effort normal and breath sounds normal. No respiratory distress. Abdominal: Soft. Bowel sounds are normal, no distension. There is no tenderness. no masses MALE GENITALIA: Normal descended testes bilaterally, no masses palpated, no hernias, no lesions, no discharge RECTAL:not done  Musculoskeletal: Normal range of motion, no joint effusions. No gross deformities Neurological: he is alert and oriented to person, place, and time. No cranial nerve deficit. Coordination, balance, strength, speech and gait are normal.  Skin: Skin is warm and dry. No rash noted. No erythema.  Psychiatric: Patient has a normal mood and affect. behavior is normal. Judgment and thought content normal.   Fall Risk:    05/29/2022    7:38 AM 03/27/2022    1:38 PM 01/16/2022    9:04 AM 10/30/2021    2:31 PM 05/16/2021    9:09 AM  Fall Risk   Falls in the past year? 1 0 0 0 0  Number falls in past yr: 0 0 0  0  Injury with Fall? 0 0 0  0  Risk for fall due to : No Fall Risks   No Fall Risks No Fall Risks  Follow up Falls prevention discussed Falls evaluation completed  Falls prevention discussed Falls prevention discussed     Functional Status Survey: Is the patient deaf or have difficulty  hearing?: No Does the patient have difficulty seeing, even when wearing glasses/contacts?: No Does the patient have difficulty concentrating, remembering, or making decisions?: No Does the patient have difficulty walking or climbing stairs?: No Does the patient have difficulty dressing or bathing?: No Does the patient have difficulty doing errands alone such as visiting a doctor's office or shopping?: No    Assessment & Plan  1. Well adult exam  - Lipid panel - CBC with Differential/Platelet - COMPLETE METABOLIC PANEL WITH GFR - Hemoglobin A1c  2. Dyslipidemia  - Lipid panel - atorvastatin (LIPITOR) 40 MG tablet; TAKE 1 TABLET(40 MG) BY MOUTH DAILY  Dispense: 90 tablet; Refill: 1  3. Hypertension, benign  - CBC with Differential/Platelet - COMPLETE METABOLIC PANEL WITH GFR - telmisartan (MICARDIS) 80 MG tablet; Take 1 tablet (80 mg total) by mouth daily.  Dispense: 90 tablet; Refill: 1  4. Diabetes mellitus screening  - Hemoglobin A1c  5. Long-term use of high-risk medication  - CBC with Differential/Platelet - COMPLETE METABOLIC PANEL WITH GFR  6. GAD (generalized anxiety disorder)  - busPIRone (BUSPAR) 15 MG tablet; TAKE 1 TABLET(15 MG) BY MOUTH TWICE DAILY  Dispense: 180 tablet; Refill: 1  7. Osteoarthritis of fingers of both hands  - meloxicam (MOBIC) 15 MG tablet; Take 1 tablet (15 mg total) by mouth daily.  Dispense: 90 tablet; Refill: 0  8. Perennial allergic rhinitis with seasonal variation  - fexofenadine (ALLEGRA) 180 MG tablet; Take 1 tablet (180 mg total) by mouth daily.  Dispense: 90 tablet; Refill: 1 - montelukast (SINGULAIR) 10 MG tablet; TAKE 1 TABLET(10 MG) BY MOUTH AT BEDTIME  Dispense: 90 tablet; Refill: 1  9. Family history of dementia  - Ambulatory referral to Neurology  10. History of left knee surgery      -Prostate cancer screening and PSA options (with potential risks and benefits of testing vs not testing) were discussed along with  recent recs/guidelines. -USPSTF grade A and  B recommendations reviewed with patient; age-appropriate recommendations, preventive care, screening tests, etc discussed and encouraged; healthy living encouraged; see AVS for patient education given to patient -Discussed importance of 150 minutes of physical activity weekly, eat two servings of fish weekly, eat one serving of tree nuts ( cashews, pistachios, pecans, almonds.Marland Kitchen) every other day, eat 6 servings of fruit/vegetables daily and drink plenty of water and avoid sweet beverages.  -Reviewed Health Maintenance: yes

## 2022-05-29 ENCOUNTER — Encounter: Payer: Self-pay | Admitting: Family Medicine

## 2022-05-29 ENCOUNTER — Ambulatory Visit (INDEPENDENT_AMBULATORY_CARE_PROVIDER_SITE_OTHER): Payer: 59 | Admitting: Family Medicine

## 2022-05-29 VITALS — BP 116/72 | HR 87 | Resp 16 | Ht 73.0 in | Wt 225.0 lb

## 2022-05-29 DIAGNOSIS — M19042 Primary osteoarthritis, left hand: Secondary | ICD-10-CM

## 2022-05-29 DIAGNOSIS — I10 Essential (primary) hypertension: Secondary | ICD-10-CM | POA: Diagnosis not present

## 2022-05-29 DIAGNOSIS — Z79899 Other long term (current) drug therapy: Secondary | ICD-10-CM | POA: Diagnosis not present

## 2022-05-29 DIAGNOSIS — M19041 Primary osteoarthritis, right hand: Secondary | ICD-10-CM | POA: Diagnosis not present

## 2022-05-29 DIAGNOSIS — J3089 Other allergic rhinitis: Secondary | ICD-10-CM

## 2022-05-29 DIAGNOSIS — Z0000 Encounter for general adult medical examination without abnormal findings: Secondary | ICD-10-CM

## 2022-05-29 DIAGNOSIS — F411 Generalized anxiety disorder: Secondary | ICD-10-CM

## 2022-05-29 DIAGNOSIS — J302 Other seasonal allergic rhinitis: Secondary | ICD-10-CM

## 2022-05-29 DIAGNOSIS — Z131 Encounter for screening for diabetes mellitus: Secondary | ICD-10-CM

## 2022-05-29 DIAGNOSIS — E785 Hyperlipidemia, unspecified: Secondary | ICD-10-CM

## 2022-05-29 DIAGNOSIS — Z9889 Other specified postprocedural states: Secondary | ICD-10-CM

## 2022-05-29 DIAGNOSIS — Z818 Family history of other mental and behavioral disorders: Secondary | ICD-10-CM

## 2022-05-29 MED ORDER — BUSPIRONE HCL 15 MG PO TABS: ORAL_TABLET | ORAL | 1 refills | Status: DC

## 2022-05-29 MED ORDER — MELOXICAM 15 MG PO TABS: 15.0000 mg | ORAL_TABLET | Freq: Every day | ORAL | 0 refills | Status: DC

## 2022-05-29 MED ORDER — MONTELUKAST SODIUM 10 MG PO TABS: ORAL_TABLET | ORAL | 1 refills | Status: DC

## 2022-05-29 MED ORDER — TELMISARTAN 80 MG PO TABS: 80.0000 mg | ORAL_TABLET | Freq: Every day | ORAL | 1 refills | Status: DC

## 2022-05-29 MED ORDER — FEXOFENADINE HCL 180 MG PO TABS: 180.0000 mg | ORAL_TABLET | Freq: Every day | ORAL | 1 refills | Status: DC

## 2022-05-29 MED ORDER — ATORVASTATIN CALCIUM 40 MG PO TABS: ORAL_TABLET | ORAL | 1 refills | Status: DC

## 2022-05-30 LAB — COMPLETE METABOLIC PANEL WITH GFR
AG Ratio: 1.8 (calc) (ref 1.0–2.5)
ALT: 28 U/L (ref 9–46)
AST: 19 U/L (ref 10–35)
Albumin: 4.4 g/dL (ref 3.6–5.1)
Alkaline phosphatase (APISO): 51 U/L (ref 35–144)
BUN: 19 mg/dL (ref 7–25)
CO2: 29 mmol/L (ref 20–32)
Calcium: 9.4 mg/dL (ref 8.6–10.3)
Chloride: 103 mmol/L (ref 98–110)
Creat: 0.8 mg/dL (ref 0.70–1.30)
Globulin: 2.4 g/dL (calc) (ref 1.9–3.7)
Glucose, Bld: 90 mg/dL (ref 65–99)
Potassium: 4.6 mmol/L (ref 3.5–5.3)
Sodium: 140 mmol/L (ref 135–146)
Total Bilirubin: 0.5 mg/dL (ref 0.2–1.2)
Total Protein: 6.8 g/dL (ref 6.1–8.1)
eGFR: 104 mL/min/{1.73_m2} (ref >=60)

## 2022-05-30 LAB — CBC WITH DIFFERENTIAL/PLATELET
Absolute Monocytes: 555 cells/uL (ref 200–950)
Basophils Absolute: 20 cells/uL (ref 0–200)
Basophils Relative: 0.4 %
Eosinophils Absolute: 100 cells/uL (ref 15–500)
Eosinophils Relative: 2 %
HCT: 46.4 % (ref 38.5–50.0)
Hemoglobin: 15.8 g/dL (ref 13.2–17.1)
Lymphs Abs: 1450 cells/uL (ref 850–3900)
MCH: 29.4 pg (ref 27.0–33.0)
MCHC: 34.1 g/dL (ref 32.0–36.0)
MCV: 86.2 fL (ref 80.0–100.0)
MPV: 10.6 fL (ref 7.5–12.5)
Monocytes Relative: 11.1 %
Neutro Abs: 2875 cells/uL (ref 1500–7800)
Neutrophils Relative %: 57.5 %
Platelets: 236 10*3/uL (ref 140–400)
RBC: 5.38 10*6/uL (ref 4.20–5.80)
RDW: 12.1 % (ref 11.0–15.0)
Total Lymphocyte: 29 %
WBC: 5 10*3/uL (ref 3.8–10.8)

## 2022-05-30 LAB — LIPID PANEL
Cholesterol: 133 mg/dL (ref <200)
HDL: 34 mg/dL — ABNORMAL LOW (ref >=40)
LDL Cholesterol (Calc): 71 mg/dL (calc)
Non-HDL Cholesterol (Calc): 99 mg/dL (calc) (ref <130)
Total CHOL/HDL Ratio: 3.9 (calc) (ref <5.0)
Triglycerides: 228 mg/dL — ABNORMAL HIGH (ref <150)

## 2022-05-30 LAB — HEMOGLOBIN A1C
Hgb A1c MFr Bld: 5.7 % of total Hgb — ABNORMAL HIGH (ref <5.7)
Mean Plasma Glucose: 117 mg/dL
eAG (mmol/L): 6.5 mmol/L

## 2022-08-05 ENCOUNTER — Other Ambulatory Visit (HOSPITAL_COMMUNITY): Payer: Self-pay | Admitting: Neurology

## 2022-08-05 DIAGNOSIS — R413 Other amnesia: Secondary | ICD-10-CM

## 2022-08-05 DIAGNOSIS — Z7189 Other specified counseling: Secondary | ICD-10-CM

## 2022-08-10 ENCOUNTER — Ambulatory Visit (HOSPITAL_COMMUNITY): Admission: RE | Admit: 2022-08-10 | Payer: 59 | Source: Ambulatory Visit

## 2022-08-10 ENCOUNTER — Encounter (HOSPITAL_COMMUNITY): Payer: Self-pay

## 2022-10-12 DIAGNOSIS — M25562 Pain in left knee: Secondary | ICD-10-CM | POA: Insufficient documentation

## 2022-10-12 DIAGNOSIS — M1712 Unilateral primary osteoarthritis, left knee: Secondary | ICD-10-CM | POA: Insufficient documentation

## 2022-10-26 ENCOUNTER — Other Ambulatory Visit: Payer: Self-pay | Admitting: Family Medicine

## 2022-10-26 DIAGNOSIS — M19041 Primary osteoarthritis, right hand: Secondary | ICD-10-CM

## 2022-11-27 ENCOUNTER — Ambulatory Visit: Payer: 59 | Admitting: Family Medicine

## 2022-12-11 ENCOUNTER — Encounter: Payer: Self-pay | Admitting: Family Medicine

## 2022-12-11 ENCOUNTER — Ambulatory Visit (INDEPENDENT_AMBULATORY_CARE_PROVIDER_SITE_OTHER): Payer: 59 | Admitting: Family Medicine

## 2022-12-11 VITALS — BP 120/72 | HR 97 | Temp 97.9°F | Resp 16 | Ht 74.0 in | Wt 218.5 lb

## 2022-12-11 DIAGNOSIS — M1732 Unilateral post-traumatic osteoarthritis, left knee: Secondary | ICD-10-CM

## 2022-12-11 DIAGNOSIS — F411 Generalized anxiety disorder: Secondary | ICD-10-CM

## 2022-12-11 DIAGNOSIS — E785 Hyperlipidemia, unspecified: Secondary | ICD-10-CM | POA: Diagnosis not present

## 2022-12-11 DIAGNOSIS — I10 Essential (primary) hypertension: Secondary | ICD-10-CM | POA: Diagnosis not present

## 2022-12-11 DIAGNOSIS — J302 Other seasonal allergic rhinitis: Secondary | ICD-10-CM

## 2022-12-11 DIAGNOSIS — Z1589 Genetic susceptibility to other disease: Secondary | ICD-10-CM

## 2022-12-11 DIAGNOSIS — Z820 Family history of epilepsy and other diseases of the nervous system: Secondary | ICD-10-CM

## 2022-12-11 DIAGNOSIS — M19041 Primary osteoarthritis, right hand: Secondary | ICD-10-CM

## 2022-12-11 DIAGNOSIS — Z8249 Family history of ischemic heart disease and other diseases of the circulatory system: Secondary | ICD-10-CM

## 2022-12-11 DIAGNOSIS — M19042 Primary osteoarthritis, left hand: Secondary | ICD-10-CM

## 2022-12-11 DIAGNOSIS — J3089 Other allergic rhinitis: Secondary | ICD-10-CM

## 2022-12-11 MED ORDER — TELMISARTAN 80 MG PO TABS
80.0000 mg | ORAL_TABLET | Freq: Every day | ORAL | 1 refills | Status: DC
Start: 2022-12-11 — End: 2023-07-05

## 2022-12-11 MED ORDER — ATORVASTATIN CALCIUM 40 MG PO TABS
ORAL_TABLET | ORAL | 1 refills | Status: DC
Start: 2022-12-11 — End: 2023-07-05

## 2022-12-11 MED ORDER — DICLOFENAC SODIUM 1 % EX GEL: 4.0000 g | Freq: Four times a day (QID) | CUTANEOUS | 1 refills | Status: DC

## 2022-12-11 MED ORDER — MONTELUKAST SODIUM 10 MG PO TABS
ORAL_TABLET | ORAL | 1 refills | Status: DC
Start: 2022-12-11 — End: 2023-07-05

## 2022-12-11 MED ORDER — BUSPIRONE HCL 15 MG PO TABS: ORAL_TABLET | ORAL | 1 refills | Status: DC

## 2022-12-11 NOTE — Progress Notes (Signed)
Name: Gavin Scott   MRN: [id number]    DOB: 04/28/1965   Date:12/11/2022       Progress Note  Subjective  Chief Complaint  Chief Complaint  Patient presents with   Follow-up    HPI  HTN: BP is low today but usually around 120's at home and no dizziness, continue current medication . No chest pain or palpitation    Hyperlipidemia: LDL was at goal, taking Lipitor daily and fish oil otc,  still walking most days of the week,  eats healthy. Last LDL was 71    Insomnia: only takes medication Ambien prn , mostly when he used to travel, discussed mindfulness at night . Unchanged   Depression/GAD: he states mother is now living with his sister in [place] Taking Buspar and doing well at this time. He is doing well    History of prostatectomy: 10/2017  doing well, it was for prostate cancer, but seeing urologist and last PSA was done 11/2021 and it was below 0.04 He still has ED using Tri-mix since Cialis was ineffective.   Hyperglycemia: he denies polyphagia, polydipsia or polyuria. , eating healthier .Last A1C was 5.7 % , in the past as high as 6.1 %. He still walks a few miles per day    GERD: he states symptoms are stable at this time    Family history of Alzheimer's : maternal grandmother and great maternal grandmother , also his father had dementia and paternal uncle diagnosed with Alzheimer's. Patient was  positive E3/E4 APOE, he is now seeing Dr. Sherryll Burger, could not get MRI brain due to insurance denial. Dr. Sherryll Burger discussed cold plunging with him  Dyslipidemia: He has family history of heart disease in their sixties's . He denies chest pain, palpitation or decrease in exercise tolerance. Reviewed cardiac calcium score of 2.65 - 46 th percentile for age and sex matched control    Patient Active Problem List   Diagnosis Date Noted   Osteoarthritis of left knee 10/12/2022   Pain in joint of left knee 10/12/2022   Patellar tendinitis of left knee 10/30/2021   Osteoarthritis of fingers of both  hands 10/30/2021   History of prostate cancer 10/30/2021   Hydrocele in adult 05/16/2021   Rectal polyp    Benign neoplasm of ascending colon    GERD without esophagitis 08/19/2015   Generalized anxiety disorder 12/18/2014   Basal cell carcinoma 12/18/2014   Hypertension, benign 12/18/2014   Circadian rhythm sleep disorder 12/18/2014   Dyslipidemia 12/18/2014   Perennial allergic rhinitis with seasonal variation 12/18/2014   Ear drum perforation 12/18/2014   Motion sickness 12/18/2014   Engages in travel abroad 12/18/2014   Dysmetabolic syndrome 12/18/2014    Past Surgical History:  Procedure Laterality Date   COLONOSCOPY WITH PROPOFOL N/A 09/29/2016   Procedure: COLONOSCOPY WITH PROPOFOL;  Surgeon: Midge Minium, MD;  Location: [facility] ENDOSCOPY;  Service: Endoscopy;  Laterality: N/A;   KNEE ARTHROSCOPY WITH MEDIAL MENISECTOMY Left 11/27/2021   Procedure: KNEE ARTHROSCOPY WITH MEDIAL MENISECTOMY;  Surgeon: Juanell Fairly, MD;  Location: [facility] ORS;  Service: Orthopedics;  Laterality: Left;   PROSTATE BIOPSY     [facility]-Negative    PROSTATECTOMY  10/17/2018   Done at [facility] ARTHROSCOPY Right    rotator cuff repair    Family History  Problem Relation Age of Onset   Stroke Mother    Hyperlipidemia Mother    Alcohol abuse Father    Stroke Father    Hyperlipidemia Father  Dementia Father     Social History   Tobacco Use   Smoking status: Never   Smokeless tobacco: Former    Types: Snuff, Chew   Tobacco comments:    as a child   Substance Use Topics   Alcohol use: Yes    Alcohol/week: 1.0 standard drink of alcohol    Types: 1 Cans of beer per week    Comment: occasionally     Current Outpatient Medications:    Ascorbic Acid (VITAMIN C) 1000 MG tablet, Take 1 tablet by mouth., Disp: , Rfl:    atorvastatin (LIPITOR) 40 MG tablet, TAKE 1 TABLET(40 MG) BY MOUTH DAILY, Disp: 90 tablet, Rfl: 1   Black Elderberry 50 MG/5ML SYRP, Take 5 mLs by mouth  daily., Disp: 240 mL, Rfl: 0   busPIRone (BUSPAR) 15 MG tablet, TAKE 1 TABLET(15 MG) BY MOUTH TWICE DAILY, Disp: 180 tablet, Rfl: 1   cyanocobalamin (VITAMIN B12) 1000 MCG tablet, Take by mouth., Disp: , Rfl:    fexofenadine (ALLEGRA) 180 MG tablet, Take 1 tablet (180 mg total) by mouth daily., Disp: 90 tablet, Rfl: 1   fluticasone (FLONASE) 50 MCG/ACT nasal spray, Place 2 sprays into both nostrils daily., Disp: 48 g, Rfl: 1   GINKGO BILOBA EXTRACT PO, Take by mouth., Disp: , Rfl:    loratadine (CLARITIN) 10 MG tablet, Take by mouth., Disp: , Rfl:    meloxicam (MOBIC) 15 MG tablet, TAKE 1 TABLET (15 MG TOTAL) BY MOUTH DAILY., Disp: 30 tablet, Rfl: 0   montelukast (SINGULAIR) 10 MG tablet, TAKE 1 TABLET(10 MG) BY MOUTH AT BEDTIME, Disp: 90 tablet, Rfl: 1   Multiple Vitamin (MULTI-VITAMIN) tablet, Take 1 tablet by mouth daily., Disp: , Rfl:    Omega-3 Fatty Acids (RA FISH OIL) 1000 MG CAPS, Take 1 capsule by mouth daily. , Disp: , Rfl:    Papav-Phentolamine-Alprostadil (TRI-MIX IC), 1 each by Intracavernosal route daily as needed., Disp: , Rfl:    tadalafil (CIALIS) 20 MG tablet, Take by mouth., Disp: , Rfl:    telmisartan (MICARDIS) 80 MG tablet, Take 1 tablet (80 mg total) by mouth daily., Disp: 90 tablet, Rfl: 1   thiamine (VITAMIN B1) 100 MG tablet, Take by mouth., Disp: , Rfl:   Allergies  Allergen Reactions   Ace Inhibitors Cough   Codeine Hives and Itching    I personally reviewed active problem list, medication list, allergies, family history with the patient/caregiver today.   ROS  Constitutional: Negative for fever or weight change.  Respiratory: Negative for cough and shortness of breath.   Cardiovascular: Negative for chest pain or palpitations.  Gastrointestinal: Negative for abdominal pain, no bowel changes.  Musculoskeletal: Negative for gait problem he has increase left knee  joint swelling.  Skin: Negative for rash.  Neurological: Negative for dizziness or headache.   No other specific complaints in a complete review of systems (except as listed in HPI above).   Objective  Vitals:   12/11/22 1403  BP: 120/72  Pulse: 97  Resp: 16  Temp: 97.9 F (36.6 C)  TempSrc: Oral  SpO2: 100%  Weight: 218 lb 8 oz (99.1 kg)  Height: 6\' 2"  (1.88 m)    Body mass index is 28.05 kg/m.  Physical Exam  Constitutional: Patient appears well-developed and well-nourished. No distress.  HEENT: head atraumatic, normocephalic, pupils equal and reactive to light, neck supple, throat within normal limits Cardiovascular: Normal rate, regular rhythm and normal heart sounds.  No murmur heard. No BLE edema. Pulmonary/Chest:  Effort normal and breath sounds normal. No respiratory distress. Abdominal: Soft.  There is no tenderness. Psychiatric: Patient has a normal mood and affect. behavior is normal. Judgment and thought content normal.    PHQ2/9:    12/11/2022    2:09 PM 05/29/2022    7:38 AM 03/27/2022    1:38 PM 01/16/2022    9:04 AM 10/30/2021    2:57 PM  Depression screen PHQ 2/9  Decreased Interest 0 0 0 0 0  Down, Depressed, Hopeless 0 0 0 0 0  PHQ - 2 Score 0 0 0 0 0  Altered sleeping 0 0  0 0  Tired, decreased energy 0 0  0 0  Change in appetite 0 0  0 0  Feeling bad or failure about yourself  0 0  0 0  Trouble concentrating 1 0  0 0  Moving slowly or fidgety/restless 0 0  0 0  Suicidal thoughts 0 0  0 0  PHQ-9 Score 1 0  0 0  Difficult doing work/chores Not difficult at all   Not difficult at all     phq 9 is negative   Fall Risk:    12/11/2022    2:09 PM 05/29/2022    7:38 AM 03/27/2022    1:38 PM 01/16/2022    9:04 AM 10/30/2021    2:31 PM  Fall Risk   Falls in the past year? 0 1 0 0 0  Number falls in past yr:  0 0 0   Injury with Fall?  0 0 0   Risk for fall due to : No Fall Risks No Fall Risks   No Fall Risks  Follow up Falls prevention discussed Falls prevention discussed Falls evaluation completed  Falls prevention discussed       Functional Status Survey: Is the patient deaf or have difficulty hearing?: No Does the patient have difficulty seeing, even when wearing glasses/contacts?: No Does the patient have difficulty concentrating, remembering, or making decisions?: No Does the patient have difficulty walking or climbing stairs?: No Does the patient have difficulty dressing or bathing?: No Does the patient have difficulty doing errands alone such as visiting a doctor's office or shopping?: No    Assessment & Plan  1. Dyslipidemia  - atorvastatin (LIPITOR) 40 MG tablet; TAKE 1 TABLET(40 MG) BY MOUTH DAILY  Dispense: 90 tablet; Refill: 1  2. Hypertension, benign  - telmisartan (MICARDIS) 80 MG tablet; Take 1 tablet (80 mg total) by mouth daily.  Dispense: 90 tablet; Refill: 1  3. GAD (generalized anxiety disorder)  - busPIRone (BUSPAR) 15 MG tablet; TAKE 1 TABLET(15 MG) BY MOUTH TWICE DAILY  Dispense: 180 tablet; Refill: 1  4. Family history of heart disease  Low cardiac calcium score  5. Family history of Alzheimer's disease  Seeing neurologist   6. APOE*3/*4 genotype  Under the care of neurologist   7. Perennial allergic rhinitis with seasonal variation  - montelukast (SINGULAIR) 10 MG tablet; TAKE 1 TABLET(10 MG) BY MOUTH AT BEDTIME  Dispense: 90 tablet; Refill: 1  8. Osteoarthritis of fingers of both hands  Stable   9. Post-traumatic osteoarthritis of left knee  - diclofenac Sodium (VOLTAREN) 1 % GEL; Apply 4 g topically 4 (four) times daily.  Dispense: 300 g; Refill: 1

## 2022-12-21 ENCOUNTER — Other Ambulatory Visit: Payer: Self-pay

## 2022-12-21 ENCOUNTER — Telehealth: Payer: Self-pay | Admitting: Family Medicine

## 2022-12-21 DIAGNOSIS — M1732 Unilateral post-traumatic osteoarthritis, left knee: Secondary | ICD-10-CM

## 2022-12-21 MED ORDER — DICLOFENAC SODIUM 1 % EX GEL: 4.0000 g | Freq: Four times a day (QID) | CUTANEOUS | 1 refills | Status: AC

## 2022-12-21 NOTE — Telephone Encounter (Signed)
Copied from CRM [id number]. Topic: General - Other >> Dec 21, 2022  2:33 PM Dondra Prader E wrote: Reason for CRM: Pt called reporting that he wants his voltaren gel tubes to go to [facility] on [address]

## 2022-12-21 NOTE — Telephone Encounter (Signed)
Updated and sent to [facility] [address]

## 2023-01-25 ENCOUNTER — Ambulatory Visit (INDEPENDENT_AMBULATORY_CARE_PROVIDER_SITE_OTHER): Payer: 59

## 2023-01-25 DIAGNOSIS — Z23 Encounter for immunization: Secondary | ICD-10-CM

## 2023-02-08 ENCOUNTER — Telehealth (INDEPENDENT_AMBULATORY_CARE_PROVIDER_SITE_OTHER): Payer: 59 | Admitting: Physician Assistant

## 2023-02-08 DIAGNOSIS — U071 COVID-19: Secondary | ICD-10-CM

## 2023-02-08 MED ORDER — NIRMATRELVIR/RITONAVIR (PAXLOVID)TABLET
3.0000 | ORAL_TABLET | Freq: Two times a day (BID) | ORAL | 0 refills | Status: AC
Start: 2023-02-08 — End: 2023-02-13

## 2023-02-08 NOTE — Patient Instructions (Signed)
Please stop the following medications   Atorvastatin for 10 days  Tadalafil for 7 days Buspar for 7 days  These medications interact with Paxlovid and it is not recommended that they are taken at the same period as Paxlovid

## 2023-02-08 NOTE — Progress Notes (Signed)
Virtual Visit via Video Note  I connected with Gavin Scott on 02/08/23 at  9:00 AM EDT by a video enabled telemedicine application and verified that I am speaking with the correct person using two identifiers.   Today's Provider: Jacquelin Hawking, MHS, PA-C Introduced myself to the patient as a PA-C and provided education on APPs in clinical practice.   Location: Patient: at home  Provider: [facility], [place], [place]    I discussed the limitations of evaluation and management by telemedicine and the availability of in person appointments. The patient expressed understanding and agreed to proceed.   Chief Complaint  Patient presents with   Covid Positive    02/07/23-Home test positive   Cough   Nasal Congestion    Mucus green   Fever    Sx started since saturday    History of Present Illness:    COVID positive   Onset: sudden  Duration: symptoms started Sat  Associated symptoms: sore throat, coughing, fever (100.5), postnasal drainage, myalgias, chest tightness Interventions: Coricidin and tylenol  Fever seemed to improve with tylenol use   Unsure of recent sick contacts Tested positive last night   Review of Systems  Constitutional:  Positive for fever and malaise/fatigue.  HENT:  Positive for congestion and sore throat.   Respiratory:  Positive for cough. Negative for shortness of breath and wheezing.   Cardiovascular:  Negative for chest pain.  Musculoskeletal:  Positive for myalgias.  Neurological:  Negative for dizziness and headaches.    Observations/Objective:  Due to the nature of the virtual visit, physical exam and observations are limited. Able to obtain the following observations:   Alert, oriented, x3 Appears comfortable, in no acute distress.  No scleral injection, no appreciated hoarseness, tachypnea, wheeze or strider. Able to maintain conversation without visible strain.  No cough appreciated during visit.   Assessment and  Plan:  Problem List Items Addressed This Visit   None Visit Diagnoses     COVID-19    -  Primary Acute, new concern Reports symptoms started on Sat - still within antiviral window He would like to start Paxlovid - reviewed potential side effects and medication interactions. Reviewed medication adjustments per UTD drug interaction tool and provided recommendations for duration of Paxlovid use Recommended he stop the following medications: Atorvastatin for 10 days, Tadalafil for 7 days, Buspar for 7 days Recommend starting Paxlovid as soon as possible- provided information regarding cost-savings cards Reviewed OTC medications for symptomatic relief - Mucinex, robitussin, Tylenol, Flonase, antihistamine  Reviewed ED and return precautions Follow up as needed for persistent or progressing symptoms     Relevant Medications   nirmatrelvir/ritonavir (PAXLOVID) 20 x 150 MG & 10 x 100MG  TABS      Follow Up Instructions:    I discussed the assessment and treatment plan with the patient. The patient was provided an opportunity to ask questions and all were answered. The patient agreed with the plan and demonstrated an understanding of the instructions.   The patient was advised to call back or seek an in-person evaluation if the symptoms worsen or if the condition fails to improve as anticipated.  I provided 15 minutes of non-face-to-face time during this encounter.  No follow-ups on file.   I, Jermel Artley E Vannary Greening, PA-C, have reviewed all documentation for this visit. The documentation on 02/08/23 for the exam, diagnosis, procedures, and orders are all accurate and complete.   Jacquelin Hawking, MHS, PA-C [facility]

## 2023-02-17 ENCOUNTER — Ambulatory Visit: Payer: 59 | Admitting: Dermatology

## 2023-02-17 DIAGNOSIS — D225 Melanocytic nevi of trunk: Secondary | ICD-10-CM

## 2023-02-17 DIAGNOSIS — W908XXA Exposure to other nonionizing radiation, initial encounter: Secondary | ICD-10-CM | POA: Diagnosis not present

## 2023-02-17 DIAGNOSIS — D239 Other benign neoplasm of skin, unspecified: Secondary | ICD-10-CM

## 2023-02-17 DIAGNOSIS — D3610 Benign neoplasm of peripheral nerves and autonomic nervous system, unspecified: Secondary | ICD-10-CM

## 2023-02-17 DIAGNOSIS — D2272 Melanocytic nevi of left lower limb, including hip: Secondary | ICD-10-CM

## 2023-02-17 DIAGNOSIS — D229 Melanocytic nevi, unspecified: Secondary | ICD-10-CM

## 2023-02-17 DIAGNOSIS — L570 Actinic keratosis: Secondary | ICD-10-CM

## 2023-02-17 DIAGNOSIS — L821 Other seborrheic keratosis: Secondary | ICD-10-CM

## 2023-02-17 DIAGNOSIS — Z1283 Encounter for screening for malignant neoplasm of skin: Secondary | ICD-10-CM

## 2023-02-17 DIAGNOSIS — D1801 Hemangioma of skin and subcutaneous tissue: Secondary | ICD-10-CM

## 2023-02-17 DIAGNOSIS — L814 Other melanin hyperpigmentation: Secondary | ICD-10-CM

## 2023-02-17 DIAGNOSIS — D2372 Other benign neoplasm of skin of left lower limb, including hip: Secondary | ICD-10-CM

## 2023-02-17 DIAGNOSIS — L578 Other skin changes due to chronic exposure to nonionizing radiation: Secondary | ICD-10-CM

## 2023-02-17 DIAGNOSIS — L820 Inflamed seborrheic keratosis: Secondary | ICD-10-CM | POA: Diagnosis not present

## 2023-02-17 DIAGNOSIS — Z85828 Personal history of other malignant neoplasm of skin: Secondary | ICD-10-CM

## 2023-02-17 DIAGNOSIS — D2239 Melanocytic nevi of other parts of face: Secondary | ICD-10-CM

## 2023-02-17 DIAGNOSIS — D2361 Other benign neoplasm of skin of right upper limb, including shoulder: Secondary | ICD-10-CM

## 2023-02-17 NOTE — Patient Instructions (Signed)

## 2023-02-17 NOTE — Progress Notes (Signed)
Follow-Up Visit   Subjective  Gavin Scott is a 57 y.o. male who presents for the following: Skin Cancer Screening and Full Body Skin Exam  The patient presents for Total-Body Skin Exam (TBSE) for skin cancer screening and mole check. The patient has spots, moles and lesions to be evaluated, some may be new or changing and the patient may have concern these could be cancer.  The following portions of the chart were reviewed this encounter and updated as appropriate: medications, allergies, medical history  Review of Systems:  No other skin or systemic complaints except as noted in HPI or Assessment and Plan.  Objective  Well appearing patient in no apparent distress; mood and affect are within normal limits.  A full examination was performed including scalp, head, eyes, ears, nose, lips, neck, chest, axillae, abdomen, back, buttocks, bilateral upper extremities, bilateral lower extremities, hands, feet, fingers, toes, fingernails, and toenails. All findings within normal limits unless otherwise noted below.   Relevant physical exam findings are noted in the Assessment and Plan.  Spinal upper back x 1, L parietal scalp x 1 (2) Erythematous stuck-on, waxy papule  L med cheek x 2 (2) Erythematous thin papules/macules with gritty scale.     Assessment & Plan   SKIN CANCER SCREENING PERFORMED TODAY.  ACTINIC DAMAGE - Chronic condition, secondary to cumulative UV/sun exposure - diffuse scaly erythematous macules with underlying dyspigmentation - Recommend daily broad spectrum sunscreen SPF 30+ to sun-exposed areas, reapply every 2 hours as needed.  - Staying in the shade or wearing long sleeves, sun glasses (UVA+UVB protection) and wide brim hats (4-inch brim around the entire circumference of the hat) are also recommended for sun protection.  - Call for new or changing lesions.  LENTIGINES, SEBORRHEIC KERATOSES, HEMANGIOMAS - Benign normal skin lesions - Benign-appearing - Call  for any changes  MELANOCYTIC NEVI 0.4 cm flesh papule on the L chest, stable  0.5 cm waxy flesh lobulated papule vs SK on the L med chest, stable 0.5 x 0.3 cm brown macule R abdomen, stable 0.4 x 0.3 cm med brown macule L post ankle, stable - Tan-brown and/or pink-flesh-colored symmetric macules and papules - Benign appearing on exam today - Observation - Call clinic for new or changing moles - Recommend daily use of broad spectrum spf 30+ sunscreen to sun-exposed areas.   Neurofibroma vs nevus  - 0.4 cm soft flesh papule on the R upper arm above the elbow.  Benign, observe.  Stable compared to previous visit.   HISTORY OF BASAL CELL CARCINOMA OF THE SKIN - R upper arm - No evidence of recurrence today - Recommend regular full body skin exams - Recommend daily broad spectrum sunscreen SPF 30+ to sun-exposed areas, reapply every 2 hours as needed.  - Call if any new or changing lesions are noted between office visits  Fibrous papule of nose R inf nasal tip, inf central nasal tip (indistinct on exam today) 1.5 mm firm pink flesh papules    Stable. Benign, Observe.   Dermatofibroma - Firm pink/brown papulenodule with dimple sign, L med knee  Benign, observe.   A dermatofibroma is a benign growth possibly related to trauma, such as an insect bite, cut from shaving, or inflamed acne-type bump.  Treatment options to remove include shave or excision with resulting scar and risk of recurrence.  Since not bothersome, will observe for now.   Inflamed seborrheic keratosis (2) Spinal upper back x 1, L parietal scalp x 1  Destruction of  lesion - Spinal upper back x 1, L parietal scalp x 1 (2)  Destruction method: cryotherapy   Informed consent: discussed and consent obtained   Timeout:  patient name, date of birth, surgical site, and procedure verified Lesion destroyed using liquid nitrogen: Yes   Region frozen until ice ball extended beyond lesion: Yes   Outcome: patient tolerated  procedure well with no complications   Post-procedure details: wound care instructions given   Additional details:  Prior to procedure, discussed risks of blister formation, small wound, skin dyspigmentation, or rare scar following cryotherapy. Recommend Vaseline ointment to treated areas while healing.   AK (actinic keratosis) (2) L med cheek x 2  Actinic keratoses are precancerous spots that appear secondary to cumulative UV radiation exposure/sun exposure over time. They are chronic with expected duration over 1 year. A portion of actinic keratoses will progress to squamous cell carcinoma of the skin. It is not possible to reliably predict which spots will progress to skin cancer and so treatment is recommended to prevent development of skin cancer.  Recommend daily broad spectrum sunscreen SPF 30+ to sun-exposed areas, reapply every 2 hours as needed.  Recommend staying in the shade or wearing long sleeves, sun glasses (UVA+UVB protection) and wide brim hats (4-inch brim around the entire circumference of the hat). Call for new or changing lesions.   Destruction of lesion - L med cheek x 2 (2)  Destruction method: cryotherapy   Informed consent: discussed and consent obtained   Lesion destroyed using liquid nitrogen: Yes   Region frozen until ice ball extended beyond lesion: Yes   Outcome: patient tolerated procedure well with no complications   Post-procedure details: wound care instructions given   Additional details:  Prior to procedure, discussed risks of blister formation, small wound, skin dyspigmentation, or rare scar following cryotherapy. Recommend Vaseline ointment to treated areas while healing.    Return in about 1 year (around 02/17/2024) for TBSE.  Maylene Roes, CMA, am acting as scribe for Willeen Niece, MD .  Documentation: I have reviewed the above documentation for accuracy and completeness, and I agree with the above.  Willeen Niece, MD

## 2023-03-29 ENCOUNTER — Other Ambulatory Visit: Payer: Self-pay | Admitting: Family Medicine

## 2023-03-29 ENCOUNTER — Telehealth: Payer: Self-pay | Admitting: Family Medicine

## 2023-03-29 DIAGNOSIS — M19041 Primary osteoarthritis, right hand: Secondary | ICD-10-CM

## 2023-03-29 NOTE — Telephone Encounter (Unsigned)
Copied from CRM [id number]. Topic: Complaint (DO NOT CONVERT) - Billing/Coding >> Mar 29, 2023 11:30 AM Franchot Heidelberg wrote: DOS: 02/08/2023 for $175.  And another date 01/25/2023  Details of complaint: Patient says he is receiving a bill without his insurance being filed.   How would the patient like to see this issue resolved? Wants this refilled with insurance  Marcelino Duster called from [employer] contact: [phone]   Mailing address:   [address] [place] [address]  Electronic claim ID: [id number]   Route to Practice Administrator.

## 2023-05-20 ENCOUNTER — Encounter: Payer: Self-pay | Admitting: Family Medicine

## 2023-05-20 ENCOUNTER — Ambulatory Visit: Payer: 59 | Admitting: Family Medicine

## 2023-05-20 VITALS — BP 110/72 | HR 82 | Temp 98.2°F | Resp 16 | Ht 74.0 in | Wt 222.2 lb

## 2023-05-20 DIAGNOSIS — J0101 Acute recurrent maxillary sinusitis: Secondary | ICD-10-CM | POA: Diagnosis not present

## 2023-05-20 MED ORDER — AZITHROMYCIN 500 MG PO TABS: 500.0000 mg | ORAL_TABLET | Freq: Every day | ORAL | 0 refills | Status: DC

## 2023-05-20 NOTE — Progress Notes (Signed)
 Name: Gavin Scott   MRN: [id number]    DOB: 23-Apr-1965   Date:05/20/2023       Progress Note  Subjective  Chief Complaint  Chief Complaint  Patient presents with   Nasal Congestion    And drainage since Sunday, took a COVID test on Tuesday was negative   Facial Pain    Around eyelids   Cough    Discussed the use of AI scribe software for clinical note transcription with the patient, who gave verbal consent to proceed.  History of Present Illness   Gavin Scott is a 58 year old male with a history of allergies and sinus infections who presents with sinus issues and drainage.  He experiences sinus issues characterized by drainage primarily around the nose area, extending to the forehead and cheeks. The drainage is described as a 'free flow of mucus' similar to a 'waterfall.' No fever is present, but there is difficulty sleeping due to the drainage, necessitating sleeping in an upright position. Symptoms began with postnasal drainage and have progressively worsened each day. No chest involvement or wheezing is noted, and he describes his cough as a tickle in the back of his throat. He is currently using a twelve-hour decongestive medication, which loses effectiveness around the ten-hour mark, leading to increased drainage.  He has a history of allergies and recurrent sinus infections, for which he typically uses over-the-counter medications such as Allegra  (fexofenadine ) and Flonase . Recently, he has tried Mucinex , which initially provided relief by drying up the mucus but is no longer effective. In the past, he has been treated for sinus infections annually and prefers a high-dose azithromycin  regimen (500 mg for three days) over a ten-day course of Augmentin .  He reports a decrease in appetite and persistent fatigue, noting that he 'hits a brick wall' around 3 PM daily.  He recently returned from a conference in [place], which he suspects may have contributed to his current symptoms. He  performed a COVID test at home, which was negative.        Patient Active Problem List   Diagnosis Date Noted   Family history of Alzheimer's disease 12/11/2022   Family history of heart disease 12/11/2022   APOE*3/*4 genotype 12/11/2022   Osteoarthritis of left knee 10/12/2022   Pain in joint of left knee 10/12/2022   Patellar tendinitis of left knee 10/30/2021   Osteoarthritis of fingers of both hands 10/30/2021   History of prostate cancer 10/30/2021   Hydrocele in adult 05/16/2021   Rectal polyp    Benign neoplasm of ascending colon    GERD without esophagitis 08/19/2015   GAD (generalized anxiety disorder) 12/18/2014   Basal cell carcinoma 12/18/2014   Hypertension, benign 12/18/2014   Circadian rhythm sleep disorder 12/18/2014   Dyslipidemia 12/18/2014   Perennial allergic rhinitis with seasonal variation 12/18/2014   Ear drum perforation 12/18/2014   Motion sickness 12/18/2014   Engages in travel abroad 12/18/2014   Dysmetabolic syndrome 12/18/2014    Social History   Tobacco Use   Smoking status: Never   Smokeless tobacco: Former    Types: Snuff, Chew   Tobacco comments:    as a child   Substance Use Topics   Alcohol use: Yes    Alcohol/week: 1.0 standard drink of alcohol    Types: 1 Cans of beer per week    Comment: occasionally     Current Outpatient Medications:    Ascorbic Acid (VITAMIN C) 1000 MG tablet, Take 1 tablet by mouth., Disp: ,  Rfl:    atorvastatin  (LIPITOR) 40 MG tablet, TAKE 1 TABLET(40 MG) BY MOUTH DAILY, Disp: 90 tablet, Rfl: 1   Black Elderberry 50 MG/5ML SYRP, Take 5 mLs by mouth daily., Disp: 240 mL, Rfl: 0   busPIRone  (BUSPAR ) 15 MG tablet, TAKE 1 TABLET(15 MG) BY MOUTH TWICE DAILY, Disp: 180 tablet, Rfl: 1   cyanocobalamin (VITAMIN B12) 1000 MCG tablet, Take by mouth., Disp: , Rfl:    diclofenac  Sodium (VOLTAREN ) 1 % GEL, Apply 4 g topically 4 (four) times daily., Disp: 300 g, Rfl: 1   fexofenadine  (ALLEGRA ) 180 MG tablet, Take 1  tablet (180 mg total) by mouth daily., Disp: 90 tablet, Rfl: 1   fluticasone  (FLONASE ) 50 MCG/ACT nasal spray, Place 2 sprays into both nostrils daily., Disp: 48 g, Rfl: 1   GINKGO BILOBA EXTRACT PO, Take by mouth., Disp: , Rfl:    loratadine  (CLARITIN ) 10 MG tablet, Take by mouth., Disp: , Rfl:    meloxicam  (MOBIC ) 15 MG tablet, TAKE 1 TABLET BY MOUTH DAILY, Disp: 30 tablet, Rfl: 0   montelukast  (SINGULAIR ) 10 MG tablet, TAKE 1 TABLET(10 MG) BY MOUTH AT BEDTIME, Disp: 90 tablet, Rfl: 1   Multiple Vitamin (MULTI-VITAMIN) tablet, Take 1 tablet by mouth daily., Disp: , Rfl:    Omega-3 Fatty Acids (RA FISH OIL) 1000 MG CAPS, Take 1 capsule by mouth daily. , Disp: , Rfl:    Papav-Phentolamine-Alprostadil (TRI-MIX IC), 1 each by Intracavernosal route daily as needed., Disp: , Rfl:    tadalafil  (CIALIS ) 20 MG tablet, Take by mouth., Disp: , Rfl:    telmisartan  (MICARDIS ) 80 MG tablet, Take 1 tablet (80 mg total) by mouth daily., Disp: 90 tablet, Rfl: 1   thiamine (VITAMIN B1) 100 MG tablet, Take by mouth., Disp: , Rfl:   Allergies  Allergen Reactions   Ace Inhibitors Cough   Codeine Hives and Itching    ROS  Ten systems reviewed and is negative except as mentioned in HPI    Objective  Vitals:   05/20/23 0822  BP: 110/72  Pulse: 82  Resp: 16  Temp: 98.2 F (36.8 C)  TempSrc: Oral  SpO2: 96%  Weight: 222 lb 3.2 oz (100.8 kg)  Height: 6' 2 (1.88 m)    Body mass index is 28.53 kg/m.    Physical Exam  Constitutional: Patient appears well-developed and well-nourished. Obese  No distress.  HEENT: head atraumatic, normocephalic, pupils equal and reactive to light, ears normal TM, neck supple, throat within normal limits, tenderness during percussion of maxillary sinus, left worse than right side Cardiovascular: Normal rate, regular rhythm and normal heart sounds.  No murmur heard. No BLE edema. Pulmonary/Chest: Effort normal and breath sounds normal. No respiratory  distress. Abdominal: Soft.  There is no tenderness. Psychiatric: Patient has a normal mood and affect. behavior is normal. Judgment and thought content normal.   Assessment and Plan    Acute Sinusitis Symptoms include sinus pressure, postnasal drainage, decreased appetite, fatigue, and cough. History of recurrent sinus infections. Over-the-counter medications (Mucinex , Allegra , Flonase ) have provided minimal relief. -Prescribe Azithromycin  500mg  daily for 3 days. -Continue over-the-counter medications and saline rinses as needed. -Check availability for earlier follow-up appointment after 06/01/2023.  General Health Maintenance / Followup Plans

## 2023-05-27 NOTE — Telephone Encounter (Signed)
MyChart message sent to the patient, billing email sent to file OV for DOS 02/08/23.

## 2023-05-28 ENCOUNTER — Other Ambulatory Visit: Payer: Self-pay | Admitting: Family Medicine

## 2023-05-28 DIAGNOSIS — M19041 Primary osteoarthritis, right hand: Secondary | ICD-10-CM

## 2023-06-01 ENCOUNTER — Encounter: Payer: 59 | Admitting: Family Medicine

## 2023-06-07 ENCOUNTER — Other Ambulatory Visit: Payer: Self-pay | Admitting: Family Medicine

## 2023-06-07 DIAGNOSIS — I10 Essential (primary) hypertension: Secondary | ICD-10-CM

## 2023-06-07 DIAGNOSIS — F411 Generalized anxiety disorder: Secondary | ICD-10-CM

## 2023-06-07 DIAGNOSIS — E785 Hyperlipidemia, unspecified: Secondary | ICD-10-CM

## 2023-06-07 DIAGNOSIS — J302 Other seasonal allergic rhinitis: Secondary | ICD-10-CM

## 2023-06-15 ENCOUNTER — Ambulatory Visit (INDEPENDENT_AMBULATORY_CARE_PROVIDER_SITE_OTHER): Payer: 59 | Admitting: Family Medicine

## 2023-06-15 ENCOUNTER — Encounter: Payer: Self-pay | Admitting: Family Medicine

## 2023-06-15 VITALS — BP 134/82 | HR 63 | Resp 16 | Ht 74.0 in | Wt 221.8 lb

## 2023-06-15 DIAGNOSIS — Z0000 Encounter for general adult medical examination without abnormal findings: Secondary | ICD-10-CM | POA: Diagnosis not present

## 2023-06-15 DIAGNOSIS — Z79899 Other long term (current) drug therapy: Secondary | ICD-10-CM

## 2023-06-15 DIAGNOSIS — Z131 Encounter for screening for diabetes mellitus: Secondary | ICD-10-CM

## 2023-06-15 DIAGNOSIS — E785 Hyperlipidemia, unspecified: Secondary | ICD-10-CM

## 2023-06-15 NOTE — Progress Notes (Signed)
 Name: Gavin Scott   MRN: [id number]    DOB: 03-07-1966   Date:06/15/2023       Progress Note  Subjective  Chief Complaint  Chief Complaint  Patient presents with   Annual Exam    HPI  Patient presents for annual CPE .   IPSS     Row Name 06/15/23 1420         International Prostate Symptom Score   How often have you had the sensation of not emptying your bladder? Not at All     How often have you had to urinate less than every two hours? Not at All     How often have you found you stopped and started again several times when you urinated? Not at All     How often have you found it difficult to postpone urination? Less than half the time     How often have you had a weak urinary stream? Not at All     How often have you had to strain to start urination? Not at All     How many times did you typically get up at night to urinate? 1 Time     Total IPSS Score 3       Quality of Life due to urinary symptoms   If you were to spend the rest of your life with your urinary condition just the way it is now how would you feel about that? Pleased              Diet: eating more vegetables, also more fish, smaller portions , eating more tree nuts, no sweets drinks  Exercise: continue regular physical activity  Last Dental Exam: up to date  Last Eye Exam: up to date   Depression: phq 9 is negative    06/15/2023    2:21 PM 05/20/2023    8:17 AM 02/08/2023    8:58 AM 12/11/2022    2:09 PM 05/29/2022    7:38 AM  Depression screen PHQ 2/9  Decreased Interest 0 0 0 0 0  Down, Depressed, Hopeless 0 0 0 0 0  PHQ - 2 Score 0 0 0 0 0  Altered sleeping 0 0 0 0 0  Tired, decreased energy 0 0 0 0 0  Change in appetite 0 0 0 0 0  Feeling bad or failure about yourself  0 0 0 0 0  Trouble concentrating 0 0 0 1 0  Moving slowly or fidgety/restless 0 0 0 0 0  Suicidal thoughts 0 0 0 0 0  PHQ-9 Score 0 0 0 1 0  Difficult doing work/chores Not difficult at all Not difficult at all Not difficult  at all Not difficult at all     Hypertension:  BP Readings from Last 3 Encounters:  06/15/23 134/82  05/20/23 110/72  12/11/22 120/72    Obesity: Wt Readings from Last 3 Encounters:  06/15/23 221 lb 12.8 oz (100.6 kg)  05/20/23 222 lb 3.2 oz (100.8 kg)  12/11/22 218 lb 8 oz (99.1 kg)   BMI Readings from Last 3 Encounters:  06/15/23 28.48 kg/m  05/20/23 28.53 kg/m  12/11/22 28.05 kg/m     Flowsheet Row Office Visit from 06/15/2023 in [facility]  AUDIT-C Score 2        Married STD testing and prevention (HIV/chl/gon/syphilis):  not applicable Sexual history: he had prostate surgery, he has to use Trimix injections for erection and it works well  Hep C Screening:  completed Skin cancer: Discussed monitoring for atypical lesions Colorectal cancer: repeat in 2028 Prostate cancer:  yes sees Urologist  Lab Results  Component Value Date   PSA 5.5 (H) 05/18/2018   PSA 2.3 12/26/2014   PSA 1.8 07/07/2013     Lung cancer:  Low Dose CT Chest recommended if Age 65-80 years, 30 pack-year currently smoking OR have quit w/in 15years. Patient  is not a candidate for screening   AAA: The USPSTF recommends one-time screening with ultrasonography in men ages 58 to 75 years who have ever smoked. Patient   is not a candidate for screening  ECG:  2023  Vaccines: reviewed with the patient.   Advanced Care Planning: A voluntary discussion about advance care planning including the explanation and discussion of advance directives.  Discussed health care proxy and Living will, and the patient was able to identify a health care proxy as wife .  Patient does have a living will and power of attorney of health care   Patient Active Problem List   Diagnosis Date Noted   Family history of Alzheimer's disease 12/11/2022   Family history of heart disease 12/11/2022   APOE*3/*4 genotype 12/11/2022   Osteoarthritis of left knee 10/12/2022   Pain in joint of left knee  10/12/2022   Patellar tendinitis of left knee 10/30/2021   Osteoarthritis of fingers of both hands 10/30/2021   History of prostate cancer 10/30/2021   Hydrocele in adult 05/16/2021   Rectal polyp    Benign neoplasm of ascending colon    GERD without esophagitis 08/19/2015   GAD (generalized anxiety disorder) 12/18/2014   Basal cell carcinoma 12/18/2014   Hypertension, benign 12/18/2014   Circadian rhythm sleep disorder 12/18/2014   Dyslipidemia 12/18/2014   Perennial allergic rhinitis with seasonal variation 12/18/2014   Ear drum perforation 12/18/2014   Motion sickness 12/18/2014   Engages in travel abroad 12/18/2014   Dysmetabolic syndrome 12/18/2014    Past Surgical History:  Procedure Laterality Date   COLONOSCOPY WITH PROPOFOL N/A 09/29/2016   Procedure: COLONOSCOPY WITH PROPOFOL;  Surgeon: Midge Minium, MD;  Location: [facility] ENDOSCOPY;  Service: Endoscopy;  Laterality: N/A;   KNEE ARTHROSCOPY WITH MEDIAL MENISECTOMY Left 11/27/2021   Procedure: KNEE ARTHROSCOPY WITH MEDIAL MENISECTOMY;  Surgeon: Juanell Fairly, MD;  Location: [facility] ORS;  Service: Orthopedics;  Laterality: Left;   PROSTATE BIOPSY     [facility]-Negative    PROSTATECTOMY  10/17/2018   Done at [facility]   SHOULDER ARTHROSCOPY Right    rotator cuff repair    Family History  Problem Relation Age of Onset   Stroke Mother    Hyperlipidemia Mother    Anxiety disorder Mother    COPD Mother    Alcohol abuse Father    Stroke Father    Hyperlipidemia Father    Dementia Father    Arthritis Father    Cancer Maternal Grandfather    Stroke Paternal Grandfather    Cancer Paternal Grandmother    Stroke Paternal Uncle     Social History   Socioeconomic History   Marital status: Married    Spouse name: Not on file   Number of children: 2   Years of education: Not on file   Highest education level: Bachelor's degree (e.g., BA, AB, BS)  Occupational History   Occupation: Health and safety inspector   Tobacco  Use   Smoking status: Never   Smokeless tobacco: Former    Types: Snuff, Chew   Tobacco comments:    as a  child   Vaping Use   Vaping status: Never Used  Substance and Sexual Activity   Alcohol use: Yes    Alcohol/week: 1.0 standard drink of alcohol    Types: 1 Cans of beer per week    Comment: occasionally   Drug use: Never   Sexual activity: Yes    Partners: Female    Birth control/protection: Surgical    Comment: Prostatectomy 10/2018  Other Topics Concern   Not on file  Social History Narrative   Not on file   Social Drivers of Health   Financial Resource Strain: Low Risk  (06/14/2023)   Overall Financial Resource Strain (CARDIA)    Difficulty of Paying Living Expenses: Not hard at all  Food Insecurity: No Food Insecurity (06/14/2023)   Hunger Vital Sign    Worried About Running Out of Food in the Last Year: Never true    Ran Out of Food in the Last Year: Never true  Transportation Needs: No Transportation Needs (06/14/2023)   PRAPARE - Administrator, Civil Service (Medical): No    Lack of Transportation (Non-Medical): No  Physical Activity: Sufficiently Active (06/14/2023)   Exercise Vital Sign    Days of Exercise per Week: 5 days    Minutes of Exercise per Session: 40 min  Stress: No Stress Concern Present (06/14/2023)   [employer] of Occupational Health - Occupational Stress Questionnaire    Feeling of Stress : Only a little  Social Connections: Moderately Integrated (06/14/2023)   Social Connection and Isolation Panel [NHANES]    Frequency of Communication with Friends and Family: More than three times a week    Frequency of Social Gatherings with Friends and Family: Twice a week    Attends Religious Services: More than 4 times per year    Active Member of [employer] or Organizations: No    Attends Banker Meetings: Not on file    Marital Status: Married  Catering manager Violence: Not At Risk (06/15/2023)   Humiliation, Afraid, Rape, and Kick  questionnaire    Fear of Current or Ex-Partner: No    Emotionally Abused: No    Physically Abused: No    Sexually Abused: No     Current Outpatient Medications:    Ascorbic Acid (VITAMIN C) 1000 MG tablet, Take 1 tablet by mouth., Disp: , Rfl:    atorvastatin (LIPITOR) 40 MG tablet, TAKE 1 TABLET(40 MG) BY MOUTH DAILY, Disp: 90 tablet, Rfl: 1   azithromycin (ZITHROMAX) 500 MG tablet, Take 1 tablet (500 mg total) by mouth daily., Disp: 3 tablet, Rfl: 0   Black Elderberry 50 MG/5ML SYRP, Take 5 mLs by mouth daily., Disp: 240 mL, Rfl: 0   busPIRone (BUSPAR) 15 MG tablet, TAKE 1 TABLET(15 MG) BY MOUTH TWICE DAILY, Disp: 180 tablet, Rfl: 1   cyanocobalamin (VITAMIN B12) 1000 MCG tablet, Take by mouth., Disp: , Rfl:    diclofenac Sodium (VOLTAREN) 1 % GEL, Apply 4 g topically 4 (four) times daily., Disp: 300 g, Rfl: 1   fexofenadine (ALLEGRA) 180 MG tablet, Take 1 tablet (180 mg total) by mouth daily., Disp: 90 tablet, Rfl: 1   fluticasone (FLONASE) 50 MCG/ACT nasal spray, Place 2 sprays into both nostrils daily., Disp: 48 g, Rfl: 1   GINKGO BILOBA EXTRACT PO, Take by mouth., Disp: , Rfl:    loratadine (CLARITIN) 10 MG tablet, Take by mouth., Disp: , Rfl:    meloxicam (MOBIC) 15 MG tablet, TAKE 1 TABLET DAILY, Disp: 90 tablet,  Rfl: 0   montelukast (SINGULAIR) 10 MG tablet, TAKE 1 TABLET(10 MG) BY MOUTH AT BEDTIME, Disp: 90 tablet, Rfl: 1   Multiple Vitamin (MULTI-VITAMIN) tablet, Take 1 tablet by mouth daily., Disp: , Rfl:    Omega-3 Fatty Acids (RA FISH OIL) 1000 MG CAPS, Take 1 capsule by mouth daily. , Disp: , Rfl:    Papav-Phentolamine-Alprostadil (TRI-MIX IC), 1 each by Intracavernosal route daily as needed., Disp: , Rfl:    tadalafil (CIALIS) 20 MG tablet, Take by mouth., Disp: , Rfl:    telmisartan (MICARDIS) 80 MG tablet, Take 1 tablet (80 mg total) by mouth daily., Disp: 90 tablet, Rfl: 1   thiamine (VITAMIN B1) 100 MG tablet, Take by mouth., Disp: , Rfl:   Allergies  Allergen  Reactions   Ace Inhibitors Cough   Codeine Hives and Itching     ROS  Constitutional: Negative for fever or weight change.  Respiratory: Negative for cough and shortness of breath.   Cardiovascular: Negative for chest pain or palpitations.  Gastrointestinal: Negative for abdominal pain, no bowel changes.  Musculoskeletal: Negative for gait problem or joint swelling.  Skin: Negative for rash.  Neurological: Negative for dizziness or headache.  No other specific complaints in a complete review of systems (except as listed in HPI above).    Objective  Vitals:   06/15/23 1422  BP: 134/82  Pulse: 63  Resp: 16  SpO2: 97%  Weight: 221 lb 12.8 oz (100.6 kg)  Height: 6\' 2"  (1.88 m)    Body mass index is 28.48 kg/m.  Physical Exam  Constitutional: Patient appears well-developed and well-nourished. No distress.  HENT: Head: Normocephalic and atraumatic. Ears: B TMs ok, no erythema or effusion; Nose: Nose normal. Mouth/Throat: Oropharynx is clear and moist. No oropharyngeal exudate.  Eyes: Conjunctivae and EOM are normal. Pupils are equal, round, and reactive to light. No scleral icterus.  Neck: Normal range of motion. Neck supple. No JVD present. No thyromegaly present.  Cardiovascular: Normal rate, regular rhythm and normal heart sounds.  No murmur heard. No BLE edema. Pulmonary/Chest: Effort normal and breath sounds normal. No respiratory distress. Abdominal: Soft. Bowel sounds are normal, no distension. There is no tenderness. no masses MALE GENITALIA:not done  RECTAL:not done  Musculoskeletal: Normal range of motion, no joint effusions. No gross deformities Neurological: he is alert and oriented to person, place, and time. No cranial nerve deficit. Coordination, balance, strength, speech and gait are normal.  Skin: Skin is warm and dry. No rash noted. No erythema.  Psychiatric: Patient has a normal mood and affect. behavior is normal. Judgment and thought content normal.      Assessment & Plan  1. Well adult exam (Primary)  - CT CARDIAC SCORING (SELF PAY ONLY); Future  2. Dyslipidemia  - Cardio IQ (R) Advanced Lipid Panel - CT CARDIAC SCORING (SELF PAY ONLY); Future  3. Long-term use of high-risk medication  - CBC with Differential/Platelet - COMPLETE METABOLIC PANEL WITH GFR  4. Diabetes mellitus screening  - Hemoglobin A1c     -Prostate cancer screening and PSA options (with potential risks and benefits of testing vs not testing) were discussed along with recent recs/guidelines. -USPSTF grade A and B recommendations reviewed with patient; age-appropriate recommendations, preventive care, screening tests, etc discussed and encouraged; healthy living encouraged; see AVS for patient education given to patient -Discussed importance of 150 minutes of physical activity weekly, eat two servings of fish weekly, eat one serving of tree nuts ( cashews, pistachios, pecans, almonds.Marland Kitchen) every  other day, eat 6 servings of fruit/vegetables daily and drink plenty of water and avoid sweet beverages.  -Reviewed Health Maintenance: yes

## 2023-06-19 LAB — COMPLETE METABOLIC PANEL WITH GFR
AG Ratio: 2.2 (calc) (ref 1.0–2.5)
ALT: 40 U/L (ref 9–46)
AST: 24 U/L (ref 10–35)
Albumin: 4.4 g/dL (ref 3.6–5.1)
Alkaline phosphatase (APISO): 57 U/L (ref 35–144)
BUN: 19 mg/dL (ref 7–25)
CO2: 29 mmol/L (ref 20–32)
Calcium: 9.2 mg/dL (ref 8.6–10.3)
Chloride: 105 mmol/L (ref 98–110)
Creat: 0.76 mg/dL (ref 0.70–1.30)
Globulin: 2 g/dL (ref 1.9–3.7)
Glucose, Bld: 98 mg/dL (ref 65–99)
Potassium: 4.7 mmol/L (ref 3.5–5.3)
Sodium: 141 mmol/L (ref 135–146)
Total Bilirubin: 0.5 mg/dL (ref 0.2–1.2)
Total Protein: 6.4 g/dL (ref 6.1–8.1)
eGFR: 105 mL/min/{1.73_m2} (ref >=60)

## 2023-06-23 ENCOUNTER — Encounter: Payer: Self-pay | Admitting: Family Medicine

## 2023-06-24 ENCOUNTER — Ambulatory Visit
Admission: RE | Admit: 2023-06-24 | Discharge: 2023-06-24 | Disposition: A | Discharge status: Home / Self Care | Payer: Self-pay | Source: Ambulatory Visit | Attending: Family Medicine | Admitting: Family Medicine

## 2023-06-24 DIAGNOSIS — Z0000 Encounter for general adult medical examination without abnormal findings: Secondary | ICD-10-CM | POA: Insufficient documentation

## 2023-06-24 DIAGNOSIS — E785 Hyperlipidemia, unspecified: Secondary | ICD-10-CM | POA: Insufficient documentation

## 2023-06-25 ENCOUNTER — Encounter: Payer: Self-pay | Admitting: Family Medicine

## 2023-07-04 LAB — CBC WITH DIFFERENTIAL/PLATELET
Absolute Lymphocytes: 1613 {cells}/uL (ref 850–3900)
Absolute Monocytes: 528 {cells}/uL (ref 200–950)
Basophils Absolute: 19 {cells}/uL (ref 0–200)
Basophils Relative: 0.4 %
Eosinophils Absolute: 110 {cells}/uL (ref 15–500)
Eosinophils Relative: 2.3 %
HCT: 45.3 % (ref 38.5–50.0)
Hemoglobin: 15 g/dL (ref 13.2–17.1)
MCH: 29.6 pg (ref 27.0–33.0)
MCHC: 33.1 g/dL (ref 32.0–36.0)
MCV: 89.3 fL (ref 80.0–100.0)
MPV: 10.9 fL (ref 7.5–12.5)
Monocytes Relative: 11 %
Neutro Abs: 2530 {cells}/uL (ref 1500–7800)
Neutrophils Relative %: 52.7 %
Platelets: 205 10*3/uL (ref 140–400)
RBC: 5.07 10*6/uL (ref 4.20–5.80)
RDW: 12.2 % (ref 11.0–15.0)
Total Lymphocyte: 33.6 %
WBC: 4.8 10*3/uL (ref 3.8–10.8)

## 2023-07-04 LAB — COMPREHENSIVE METABOLIC PANEL
AG Ratio: 2.2 (calc) (ref 1.0–2.5)
ALT: 40 U/L (ref 9–46)
AST: 24 U/L (ref 10–35)
Albumin: 4.4 g/dL (ref 3.6–5.1)
Alkaline phosphatase (APISO): 57 U/L (ref 35–144)
BUN: 19 mg/dL (ref 7–25)
CO2: 29 mmol/L (ref 20–32)
Calcium: 9.2 mg/dL (ref 8.6–10.3)
Chloride: 105 mmol/L (ref 98–110)
Creat: 0.76 mg/dL (ref 0.70–1.30)
Globulin: 2 g/dL (ref 1.9–3.7)
Glucose, Bld: 98 mg/dL (ref 65–99)
Potassium: 4.7 mmol/L (ref 3.5–5.3)
Sodium: 141 mmol/L (ref 135–146)
Total Bilirubin: 0.5 mg/dL (ref 0.2–1.2)
Total Protein: 6.4 g/dL (ref 6.1–8.1)
eGFR: 105 mL/min/{1.73_m2} (ref >=60)

## 2023-07-04 LAB — HEMOGLOBIN A1C
Hgb A1c MFr Bld: 5.7 %{Hb} — ABNORMAL HIGH (ref <5.7)
Mean Plasma Glucose: 117 mg/dL
eAG (mmol/L): 6.5 mmol/L

## 2023-07-04 LAB — CARDIO IQ(R) ADVANCED LIPID PANEL
Apolipoprotein B: 64 mg/dL (ref <90)
Cholesterol: 108 mg/dL (ref <200)
HDL LARGE: 3092 nmol/L — ABNORMAL LOW (ref >6729)
HDL: 31 mg/dL — ABNORMAL LOW (ref >39)
LDL Cholesterol (Calc): 54 mg/dL (ref <100)
LDL Medium: 120 nmol/L (ref <215)
LDL Particle Number: 960 nmol/L (ref <1138)
LDL Peak Size: 208 Angstrom — ABNORMAL LOW (ref >222.9)
LDL Small: 168 nmol/L — ABNORMAL HIGH (ref <142)
Lipoprotein (a): 15 nmol/L (ref <75)
Non-HDL Cholesterol (Calc): 77 mg/dL (ref <130)
Total CHOL/HDL Ratio: 3.5 calc (ref <5.0)
Triglycerides: 144 mg/dL (ref <150)

## 2023-07-05 ENCOUNTER — Encounter: Payer: Self-pay | Admitting: Family Medicine

## 2023-07-05 ENCOUNTER — Ambulatory Visit: Payer: 59 | Admitting: Family Medicine

## 2023-07-05 VITALS — BP 128/80 | HR 69 | Temp 98.1°F | Resp 16 | Ht 74.0 in | Wt 224.5 lb

## 2023-07-05 DIAGNOSIS — I10 Essential (primary) hypertension: Secondary | ICD-10-CM | POA: Diagnosis not present

## 2023-07-05 DIAGNOSIS — Z1589 Genetic susceptibility to other disease: Secondary | ICD-10-CM

## 2023-07-05 DIAGNOSIS — K219 Gastro-esophageal reflux disease without esophagitis: Secondary | ICD-10-CM

## 2023-07-05 DIAGNOSIS — E785 Hyperlipidemia, unspecified: Secondary | ICD-10-CM

## 2023-07-05 DIAGNOSIS — R739 Hyperglycemia, unspecified: Secondary | ICD-10-CM

## 2023-07-05 DIAGNOSIS — M2560 Stiffness of unspecified joint, not elsewhere classified: Secondary | ICD-10-CM

## 2023-07-05 DIAGNOSIS — F411 Generalized anxiety disorder: Secondary | ICD-10-CM | POA: Diagnosis not present

## 2023-07-05 DIAGNOSIS — M19041 Primary osteoarthritis, right hand: Secondary | ICD-10-CM

## 2023-07-05 DIAGNOSIS — Z820 Family history of epilepsy and other diseases of the nervous system: Secondary | ICD-10-CM

## 2023-07-05 DIAGNOSIS — J3089 Other allergic rhinitis: Secondary | ICD-10-CM

## 2023-07-05 DIAGNOSIS — Z23 Encounter for immunization: Secondary | ICD-10-CM

## 2023-07-05 DIAGNOSIS — J302 Other seasonal allergic rhinitis: Secondary | ICD-10-CM

## 2023-07-05 DIAGNOSIS — M19042 Primary osteoarthritis, left hand: Secondary | ICD-10-CM

## 2023-07-05 DIAGNOSIS — Z8249 Family history of ischemic heart disease and other diseases of the circulatory system: Secondary | ICD-10-CM

## 2023-07-05 MED ORDER — BUSPIRONE HCL 15 MG PO TABS: ORAL_TABLET | ORAL | 1 refills | Status: DC

## 2023-07-05 MED ORDER — TELMISARTAN 80 MG PO TABS: 80.0000 mg | ORAL_TABLET | Freq: Every day | ORAL | 1 refills | Status: DC

## 2023-07-05 MED ORDER — ATORVASTATIN CALCIUM 40 MG PO TABS: ORAL_TABLET | ORAL | 1 refills | Status: DC

## 2023-07-05 MED ORDER — CELECOXIB 100 MG PO CAPS: 100.0000 mg | ORAL_CAPSULE | Freq: Two times a day (BID) | ORAL | 1 refills | Status: DC

## 2023-07-05 MED ORDER — MONTELUKAST SODIUM 10 MG PO TABS: ORAL_TABLET | ORAL | 1 refills | Status: DC

## 2023-07-05 NOTE — Progress Notes (Signed)
 Name: Gavin Scott   MRN: [id number]    DOB: 05-16-1965   Date:07/05/2023       Progress Note  Subjective  Chief Complaint  Chief Complaint  Patient presents with   Medical Management of Chronic Issues   HPI   HTN: BP is at goal, continue medications. No chest pain, palpitation or sob     Insomnia/shift work sleep disorder: only takes medication Ambien prn , mostly when he used to travel, discussed mindfulness at night . Unchanged    Depression/GAD:  Taking Buspar and doing well at this time. Mother died in 06/28/23  OA: left knee , getting worse, he states walking helps. He states left knee seems to still feel warm post meniscectomy done about 2 years ago. Noticing more pain and stiffness on his hands that lasts over one hour.   History of prostatectomy: 10/2017  doing well, it was for prostate cancer, but seeing urologist and last PSA was done 11/2021 and it was below 0.04 He still has ED using Tri-mix given by Urologist    Hyperglycemia: he denies polyphagia, polydipsia or polyuria. , eating healthier .Last A1C was 5.7 % , in the past as high as 6.1 %. Discussed low carb diet. Continue regular physical activity    GERD: he states symptoms are stable at this time , discussed risk of NSAID's   Family history of Alzheimer's : maternal grandmother and great maternal grandmother , also his father had dementia and paternal uncle diagnosed with Alzheimer's. Patient was  positive E3/E4 APOE, he is now seeing Dr. Sherryll Burger, could not get MRI brain due to insurance denial. Dr. Sherryll Burger discussed cold plunging with him. Also low HD   Dyslipidemia: He has family history of heart disease in their sixties's . He denies chest pain, palpitation or decrease in exercise tolerance. Reviewed cardiac calcium score of 2.65 - 46 th percentile for age and sex matched control done in 06-27-2021 , same LCx is now up to 20.6 and he is on 11 % for age and sex.  Discussed referral to cardiologist since only one vessel and rapid  progression  Patient Active Problem List   Diagnosis Date Noted   Family history of Alzheimer's disease 12/11/2022   Family history of heart disease 12/11/2022   APOE*3/*4 genotype 12/11/2022   Osteoarthritis of left knee 10/12/2022   Pain in joint of left knee 10/12/2022   Patellar tendinitis of left knee 10/30/2021   Osteoarthritis of fingers of both hands 10/30/2021   History of prostate cancer 10/30/2021   Hydrocele in adult 05/16/2021   Rectal polyp    Benign neoplasm of ascending colon    GERD without esophagitis 08/19/2015   GAD (generalized anxiety disorder) 12/18/2014   Basal cell carcinoma 12/18/2014   Hypertension, benign 12/18/2014   Circadian rhythm sleep disorder 12/18/2014   Dyslipidemia 12/18/2014   Perennial allergic rhinitis with seasonal variation 12/18/2014   Ear drum perforation 12/18/2014   Motion sickness 12/18/2014   Engages in travel abroad 12/18/2014   Dysmetabolic syndrome 12/18/2014    Past Surgical History:  Procedure Laterality Date   COLONOSCOPY WITH PROPOFOL N/A 09/29/2016   Procedure: COLONOSCOPY WITH PROPOFOL;  Surgeon: Midge Minium, MD;  Location: [facility] ENDOSCOPY;  Service: Endoscopy;  Laterality: N/A;   KNEE ARTHROSCOPY WITH MEDIAL MENISECTOMY Left 11/27/2021   Procedure: KNEE ARTHROSCOPY WITH MEDIAL MENISECTOMY;  Surgeon: Juanell Fairly, MD;  Location: [facility] ORS;  Service: Orthopedics;  Laterality: Left;   PROSTATE BIOPSY     [facility]    PROSTATECTOMY  10/17/2018   Done at [facility]   SHOULDER ARTHROSCOPY Right    rotator cuff repair    Family History  Problem Relation Age of Onset   Stroke Mother    Hyperlipidemia Mother    Anxiety disorder Mother    COPD Mother    Alcohol abuse Father    Stroke Father    Hyperlipidemia Father    Dementia Father    Arthritis Father    Cancer Maternal Grandfather    Stroke Paternal Grandfather    Cancer Paternal Grandmother    Stroke Paternal Uncle     Social History   Tobacco  Use   Smoking status: Never   Smokeless tobacco: Former    Types: Snuff, Chew   Tobacco comments:    as a child   Substance Use Topics   Alcohol use: Yes    Alcohol/week: 1.0 standard drink of alcohol    Types: 1 Cans of beer per week    Comment: occasionally     Current Outpatient Medications:    Ascorbic Acid (VITAMIN C) 1000 MG tablet, Take 1 tablet by mouth., Disp: , Rfl:    atorvastatin (LIPITOR) 40 MG tablet, TAKE 1 TABLET(40 MG) BY MOUTH DAILY, Disp: 90 tablet, Rfl: 1   Black Elderberry 50 MG/5ML SYRP, Take 5 mLs by mouth daily., Disp: 240 mL, Rfl: 0   busPIRone (BUSPAR) 15 MG tablet, TAKE 1 TABLET(15 MG) BY MOUTH TWICE DAILY, Disp: 180 tablet, Rfl: 1   cyanocobalamin (VITAMIN B12) 1000 MCG tablet, Take by mouth., Disp: , Rfl:    diclofenac Sodium (VOLTAREN) 1 % GEL, Apply 4 g topically 4 (four) times daily., Disp: 300 g, Rfl: 1   fexofenadine (ALLEGRA) 180 MG tablet, Take 1 tablet (180 mg total) by mouth daily., Disp: 90 tablet, Rfl: 1   fluticasone (FLONASE) 50 MCG/ACT nasal spray, Place 2 sprays into both nostrils daily., Disp: 48 g, Rfl: 1   GINKGO BILOBA EXTRACT PO, Take by mouth., Disp: , Rfl:    loratadine (CLARITIN) 10 MG tablet, Take by mouth., Disp: , Rfl:    meloxicam (MOBIC) 15 MG tablet, TAKE 1 TABLET DAILY, Disp: 90 tablet, Rfl: 0   montelukast (SINGULAIR) 10 MG tablet, TAKE 1 TABLET(10 MG) BY MOUTH AT BEDTIME, Disp: 90 tablet, Rfl: 1   Multiple Vitamin (MULTI-VITAMIN) tablet, Take 1 tablet by mouth daily., Disp: , Rfl:    Omega-3 Fatty Acids (RA FISH OIL) 1000 MG CAPS, Take 1 capsule by mouth daily. , Disp: , Rfl:    Papav-Phentolamine-Alprostadil (TRI-MIX IC), 1 each by Intracavernosal route daily as needed., Disp: , Rfl:    tadalafil (CIALIS) 20 MG tablet, Take by mouth., Disp: , Rfl:    telmisartan (MICARDIS) 80 MG tablet, Take 1 tablet (80 mg total) by mouth daily., Disp: 90 tablet, Rfl: 1   thiamine (VITAMIN B1) 100 MG tablet, Take by mouth., Disp: , Rfl:     azithromycin (ZITHROMAX) 500 MG tablet, Take 1 tablet (500 mg total) by mouth daily. (Patient not taking: Reported on 07/05/2023), Disp: 3 tablet, Rfl: 0  Allergies  Allergen Reactions   Ace Inhibitors Cough   Codeine Hives and Itching    I personally reviewed active problem list, medication list, allergies with the patient/caregiver today.   ROS  Ten systems reviewed and is negative except as mentioned in HPI    Objective  Vitals:   07/05/23 0816  BP: 128/80  Pulse: 69  Resp: 16  Temp: 98.1  F (36.7 C)  TempSrc: Oral  SpO2: 97%  Weight: 224 lb 8 oz (101.8 kg)  Height: 6\' 2"  (1.88 m)    Body mass index is 28.82 kg/m.  Physical Exam  Constitutional: Patient appears well-developed and well-nourished. No distress.  HEENT: head atraumatic, normocephalic, pupils equal and reactive to light, neck supple Cardiovascular: Normal rate, regular rhythm and normal heart sounds.  No murmur heard. No BLE edema. Pulmonary/Chest: Effort normal and breath sounds normal. No respiratory distress. Abdominal: Soft.  There is no tenderness. Muscular skeletal: deformities on both hands, no synovitis. DIP and PIP both hands Psychiatric: Patient has a normal mood and affect. behavior is normal. Judgment and thought content normal.   Recent Results (from the past 2160 hours)  CBC with Differential/Platelet     Status: None   Collection Time: 06/18/23  8:06 AM  Result Value Ref Range   WBC 4.8 3.8 - 10.8 Thousand/uL   RBC 5.07 4.20 - 5.80 Million/uL   Hemoglobin 15.0 13.2 - 17.1 g/dL   HCT 16.1 09.6 - 04.5 %   MCV 89.3 80.0 - 100.0 fL   MCH 29.6 27.0 - 33.0 pg   MCHC 33.1 32.0 - 36.0 g/dL    Comment: For adults, a slight decrease in the calculated MCHC value (in the range of 30 to 32 g/dL) is most likely not clinically significant; however, it should be interpreted with caution in correlation with other red cell parameters and the patient's clinical condition.    RDW 12.2 11.0 - 15.0  %   Platelets 205 140 - 400 Thousand/uL   MPV 10.9 7.5 - 12.5 fL   Neutro Abs 2,530 1,500 - 7,800 cells/uL   Absolute Lymphocytes 1,613 850 - 3,900 cells/uL   Absolute Monocytes 528 200 - 950 cells/uL   Eosinophils Absolute 110 15 - 500 cells/uL   Basophils Absolute 19 0 - 200 cells/uL   Neutrophils Relative % 52.7 %   Total Lymphocyte 33.6 %   Monocytes Relative 11.0 %   Eosinophils Relative 2.3 %   Basophils Relative 0.4 %  COMPLETE METABOLIC PANEL WITH GFR     Status: None   Collection Time: 06/18/23  8:06 AM  Result Value Ref Range   Glucose, Bld 98 65 - 99 mg/dL    Comment: .            Fasting reference interval .    BUN 19 7 - 25 mg/dL   Creat 4.09 8.11 - 9.14 mg/dL   eGFR 782 > OR = 60 NF/AOZ/3.08M5   BUN/Creatinine Ratio SEE NOTE: 6 - 22 (calc)    Comment:    Not Reported: BUN and Creatinine are within    reference range. .    Sodium 141 135 - 146 mmol/L   Potassium 4.7 3.5 - 5.3 mmol/L   Chloride 105 98 - 110 mmol/L   CO2 29 20 - 32 mmol/L   Calcium 9.2 8.6 - 10.3 mg/dL   Total Protein 6.4 6.1 - 8.1 g/dL   Albumin 4.4 3.6 - 5.1 g/dL   Globulin 2.0 1.9 - 3.7 g/dL (calc)   AG Ratio 2.2 1.0 - 2.5 (calc)   Total Bilirubin 0.5 0.2 - 1.2 mg/dL   Alkaline phosphatase (APISO) 57 35 - 144 U/L   AST 24 10 - 35 U/L   ALT 40 9 - 46 U/L  Hemoglobin A1c     Status: Abnormal   Collection Time: 06/18/23  8:06 AM  Result Value Ref Range  Hgb A1c MFr Bld 5.7 (H) <5.7 % of total Hgb    Comment: For someone without known diabetes, a hemoglobin  A1c value between 5.7% and 6.4% is consistent with prediabetes and should be confirmed with a  follow-up test. . For someone with known diabetes, a value <7% indicates that their diabetes is well controlled. A1c targets should be individualized based on duration of diabetes, age, comorbid conditions, and other considerations. . This assay result is consistent with an increased risk of diabetes. . Currently, no consensus  exists regarding use of hemoglobin A1c for diagnosis of diabetes for children. .    Mean Plasma Glucose 117 mg/dL   eAG (mmol/L) 6.5 mmol/L  Cardio IQ (R) Advanced Lipid Panel     Status: Abnormal   Collection Time: 06/18/23  8:06 AM  Result Value Ref Range   Cholesterol 108 <200 mg/dL   HDL 31 (L) >30 mg/dL   Triglycerides 865 <784 mg/dL   LDL Cholesterol (Calc) 54 <696 mg/dL (calc)    Comment: Desirable range <100 mg/dL for primary prevention; <70 mg/dL for patients with CHD or diabetic patients with >= 2 CHD risk factors.  LDL-C is now calculated using the Martin-Hopkins calculation, which is a validated novel method providing better accuracy than the Friedewald equation in the estimation of LDL-C. Horald Pollen et al. Lenox Ahr. 2952;841(32): 2061-2068 (http://education.[facility].com/faq/FAQ164)  LDL-C is now calculated using the Martin-Hopkins  calculation, which is a validated novel method providing  better accuracy than the Friedewald equation in the  estimation of LDL-C.  Horald Pollen et al. Lenox Ahr. 4401;027(25): 2061-2068  (http://education.[facility].com/faq/FAQ164)    Total CHOL/HDL Ratio 3.5 <5.0 calc   Non-HDL Cholesterol (Calc) 77 <366 mg/dL (calc)    Comment: For patients with diabetes plus 1 major ASCVD risk factor, treating to a non-HDL-C goal of <100 mg/dL (LDL-C of <44 mg/dL) is considered a therapeutic option.  For patients with diabetes plus 1 major ASCVD risk  factor, treating to a non-HDL-C goal of <100 mg/dL  (LDL-C of <03 mg/dL) is considered a therapeutic  option.    LDL Particle Number 960 <1,138 nmol/L    Comment: Relative Risk: Optimal <1138; Moderate [id number]; High >1409. Male and Male Reference Range: 1016 to 2185 nmol/L.     LDL Small 168 (H) <142 nmol/L    Comment: Relative Risk: Optimal <142; Moderate 142-219; High >219. Male Reference Range: 123 to 441 nmol/L; Male Reference Range: 115 to 386 nmol/L.     LDL Medium 120 <215 nmol/L     Comment: Relative Risk: Optimal <215; Moderate 215-301; High >301. Male Reference Range: 167 to 485 nmol/L; Male Reference Range: 121 to 397 nmol/L.     HDL LARGE 3,092 (L) >6,729 nmol/L    Comment: Relative Risk: Optimal >6729; Moderate [id number]; High <5353. Male Reference Range: 4334 to 10815 nmol/L; Male Reference Range: 5038 to 17886 nmol/L.     LDL Pattern B (A) A Pattern    Comment: Relative Risk: Optimal Pattern A; High Pattern B. Reference Range: Pattern A.     LDL Peak Size 208.0 (L) >222.9 Angstrom    Comment: This test was developed and its analytical performance characteristics have been determined by [facility]. It has not been cleared or approved by the U.S. Food and Drug Administration. This assay has been validated pursuant to the CLIA regulations and is used for clinical purposes.  Relative Risk: Optimal >222.9; Moderate 222.9-217.4; High <217.4. Male and Male Reference Range: 216 to  234.3 Angstrom. Adult cardiovascular event risk category cut points (optimal, moderate, high) are based on an adult [place] reference population plus two large cohort study populations. Association between lipoprotein subfractions and cardiovascular events is based on Musunuru et al. Jiles Crocker.2009;29:1975. For additional information, please refer to http://education.[facility].com/faq/FAQ134 (This link is being provided for informational/educational purposes only.)     Apolipoprotein B 64 <90 mg/dL    Comment: Risk: Optimal <90 mg/dL; Moderate 90-119 mg/dL; High >= 120 mg/dL;  Cardiovascular event risk category cut points (optimal, moderate, high) are based on National Lipid Association recommendations-  Perry Mount TA et al. J of Clin Lipid. 2015; 9: 129-169 and Jellinger PS et al. Baruch Goldmann Pract. 2017;23(Suppl 2):1-87.     Lipoprotein (a) 15 <75 nmol/L    Comment: Risk: Optimal <75 nmol/L; Moderate 75-125  nmol/L; High >125 nmol/L. Cardiovascular event risk category cut points (optimal, moderate, high) are based on Tsimika S. JACC 2017;69:692-711.    Comprehensive metabolic panel     Status: None   Collection Time: 06/18/23  8:06 AM  Result Value Ref Range   Glucose, Bld 98 65 - 99 mg/dL    Comment: .            Fasting reference interval .    BUN 19 7 - 25 mg/dL   Creat 1.61 0.96 - 0.45 mg/dL   eGFR 409 > OR = 60 WJ/XBJ/4.78G9   BUN/Creatinine Ratio SEE NOTE: 6 - 22 (calc)    Comment:    Not Reported: BUN and Creatinine are within    reference range. .    Sodium 141 135 - 146 mmol/L   Potassium 4.7 3.5 - 5.3 mmol/L   Chloride 105 98 - 110 mmol/L   CO2 29 20 - 32 mmol/L   Calcium 9.2 8.6 - 10.3 mg/dL   Total Protein 6.4 6.1 - 8.1 g/dL   Albumin 4.4 3.6 - 5.1 g/dL   Globulin 2.0 1.9 - 3.7 g/dL (calc)   AG Ratio 2.2 1.0 - 2.5 (calc)   Total Bilirubin 0.5 0.2 - 1.2 mg/dL   Alkaline phosphatase (APISO) 57 35 - 144 U/L   AST 24 10 - 35 U/L   ALT 40 9 - 46 U/L    Diabetic Foot Exam:     PHQ2/9:    07/05/2023    8:16 AM 06/15/2023    2:21 PM 05/20/2023    8:17 AM 02/08/2023    8:58 AM 12/11/2022    2:09 PM  Depression screen PHQ 2/9  Decreased Interest 0 0 0 0 0  Down, Depressed, Hopeless 0 0 0 0 0  PHQ - 2 Score 0 0 0 0 0  Altered sleeping 0 0 0 0 0  Tired, decreased energy 0 0 0 0 0  Change in appetite 0 0 0 0 0  Feeling bad or failure about yourself  0 0 0 0 0  Trouble concentrating 0 0 0 0 1  Moving slowly or fidgety/restless 0 0 0 0 0  Suicidal thoughts 0 0 0 0 0  PHQ-9 Score 0 0 0 0 1  Difficult doing work/chores Not difficult at all Not difficult at all Not difficult at all Not difficult at all Not difficult at all    phq 9 is negative  Fall Risk:    05/20/2023    8:15 AM 02/08/2023    8:58 AM 12/11/2022    2:09 PM 05/29/2022    7:38 AM 03/27/2022    1:38 PM  Fall Risk  Falls in the past year? 0 0 0 1 0  Number falls in past yr: 0 0  0 0  Injury with  Fall? 0 0  0 0  Risk for fall due to : No Fall Risks No Fall Risks No Fall Risks No Fall Risks   Follow up Falls prevention discussed;Education provided;Falls evaluation completed Falls prevention discussed;Education provided;Falls evaluation completed Falls prevention discussed Falls prevention discussed Falls evaluation completed     Assessment & Plan  1. Dyslipidemia (Primary)  - Ambulatory referral to Cardiology - atorvastatin (LIPITOR) 40 MG tablet; TAKE 1 TABLET(40 MG) BY MOUTH DAILY  Dispense: 90 tablet; Refill: 1  2. Hypertension, benign  - telmisartan (MICARDIS) 80 MG tablet; Take 1 tablet (80 mg total) by mouth daily.  Dispense: 90 tablet; Refill: 1  3. GAD (generalized anxiety disorder)  - busPIRone (BUSPAR) 15 MG tablet; TAKE 1 TABLET(15 MG) BY MOUTH TWICE DAILY  Dispense: 180 tablet; Refill: 1  4. APOE*3/*4 genotype  Done by Dr. Sherryll Burger   5. Family history of Alzheimer's disease  Under the care of Dr. Sherryll Burger  6. Perennial allergic rhinitis with seasonal variation  - montelukast (SINGULAIR) 10 MG tablet; TAKE 1 TABLET(10 MG) BY MOUTH AT BEDTIME  Dispense: 90 tablet; Refill: 1  7. GERD without esophagitis  Doing well   8. Hyperglycemia  Discussed low carbohydrate diet and Myfitnesspal  9. Family history of heart disease  - Ambulatory referral to Cardiology  10. Osteoarthritis of fingers of both hands  Stable, we will change from meloxicam to celebrex, try to take more tylenol than nsaid's  11. Joint stiffness  - C-reactive protein - Cyclic citrul peptide antibody, IgG - ANA - Rheumatoid factor - Sedimentation rate  12. Need for vaccination with 20-polyvalent pneumococcal conjugate vaccine  - Pneumococcal conjugate vaccine 20-valent (Prevnar 20)

## 2023-07-06 LAB — CYCLIC CITRUL PEPTIDE ANTIBODY, IGG: Cyclic Citrullin Peptide Ab: <16 U

## 2023-07-06 LAB — ANA: Anti Nuclear Antibody (ANA): NEGATIVE

## 2023-07-06 LAB — SEDIMENTATION RATE: Sed Rate: 2 mm/h (ref 0–20)

## 2023-07-06 LAB — RHEUMATOID FACTOR: Rheumatoid fact SerPl-aCnc: <10 [IU]/mL (ref <14)

## 2023-07-06 LAB — C-REACTIVE PROTEIN: CRP: <3 mg/L (ref <8.0)

## 2023-07-07 ENCOUNTER — Encounter: Payer: Self-pay | Admitting: Family Medicine

## 2023-07-19 ENCOUNTER — Ambulatory Visit: Attending: Cardiology | Admitting: Cardiology

## 2023-07-19 ENCOUNTER — Encounter: Payer: Self-pay | Admitting: Cardiology

## 2023-07-19 VITALS — BP 140/72 | HR 76 | Ht 73.0 in | Wt 223.4 lb

## 2023-07-19 DIAGNOSIS — R931 Abnormal findings on diagnostic imaging of heart and coronary circulation: Secondary | ICD-10-CM | POA: Diagnosis not present

## 2023-07-19 DIAGNOSIS — R0609 Other forms of dyspnea: Secondary | ICD-10-CM

## 2023-07-19 DIAGNOSIS — I10 Essential (primary) hypertension: Secondary | ICD-10-CM | POA: Diagnosis not present

## 2023-07-19 DIAGNOSIS — E785 Hyperlipidemia, unspecified: Secondary | ICD-10-CM | POA: Diagnosis not present

## 2023-07-19 NOTE — Patient Instructions (Addendum)
 Medication Instructions:  Your physician recommends that you continue on your current medications as directed. Please refer to the Current Medication list given to you today.  *If you need a refill on your cardiac medications before your next appointment, please call your pharmacy*   Lab Work: Lpa- today If you have labs (blood work) drawn today and your tests are completely normal, you will receive your results only by: MyChart Message (if you have MyChart) OR A paper copy in the mail If you have any lab test that is abnormal or we need to change your treatment, we will call you to review the results.   Testing/Procedures: Your physician has requested that you have an echocardiogram. Echocardiography is a painless test that uses sound waves to create images of your heart. It provides your doctor with information about the size and shape of your heart and how well your heart's chambers and valves are working. This procedure takes approximately one hour. There are no restrictions for this procedure. Please do NOT wear cologne, perfume, aftershave, or lotions (deodorant is allowed). Please arrive 15 minutes prior to your appointment time.  Please note: We ask at that you not bring children with you during ultrasound (echo/ vascular) testing. Due to room size and safety concerns, children are not allowed in the ultrasound rooms during exams. Our front office staff cannot provide observation of children in our lobby area while testing is being conducted. An adult accompanying a patient to their appointment will only be allowed in the ultrasound room at the discretion of the ultrasound technician under special circumstances. We apologize for any inconvenience.   Vascular Screening- No preparation   Follow-Up: At [facility], you and your health needs are our priority.  As part of our continuing mission to provide you with exceptional heart care, we have created designated Provider Care Teams.   These Care Teams include your primary Cardiologist (physician) and Advanced Practice Providers (APPs -  Physician Assistants and Nurse Practitioners) who all work together to provide you with the care you need, when you need it.  We recommend signing up for the patient portal called "MyChart".  Sign up information is provided on this After Visit Summary.  MyChart is used to connect with patients for Virtual Visits (Telemedicine).  Patients are able to view lab/test results, encounter notes, upcoming appointments, etc.  Non-urgent messages can be sent to your provider as well.   To learn more about what you can do with MyChart, go to [email or web address].    Your next appointment:   3 month(s)  The format for your next appointment:   In Person  Provider:   Gypsy Balsam, MD    Other Instructions NA

## 2023-07-19 NOTE — Addendum Note (Signed)
 Addended by: Baldo Ash D on: 07/19/2023 03:37 PM   Modules accepted: Orders

## 2023-07-19 NOTE — Progress Notes (Signed)
 Cardiology Consultation:    Date:  07/19/2023   ID:  Gavin Scott, DOB Nov 24, 1965, MRN [id number]  PCP:  Alba Cory, MD  Cardiologist:  Gypsy Balsam, MD   Referring MD: Alba Cory, MD   Chief Complaint  Patient presents with   Follow-up    History of Present Illness:    Gavin Scott is a 59 y.o. male who is being seen today for the evaluation of elevated calcium score at the request of Alba Cory, MD. past medical history significant for essential hypertension, dyslipidemia treated for about 10 years with statin, he did have coronary calcium done in 2023 which was 2.95, 46 percentile for his age, then he had calcium score repeated this year and calcium score increased to 20.6 which is 54 percentile he is alarmed by that and that this is the reason why he is here to talk about it.  Overall he is doing very well.  He denies having chest pain tightness squeezing pressure burning chest.  He is try to be active and exercise on a regular basis.  He did have injury to the left knee required some pausing from his exercise but now he is back to it and doing well.  He also got borderline diabetes as well as essential hypertension treated for years.  Multiple family members with CVA some young age.  He does not smoke does exercise on a regular basis trying to stick with a good diet  Past Medical History:  Diagnosis Date   Allergy    Anxiety    Arthritis 2016   Basal cell carcinoma 04/09/2014   Right upper arm   Circadian rhythm disorder    Hyperlipidemia    Hypertension    Metabolic syndrome    Obesity    Perforation of left tympanic membrane     Past Surgical History:  Procedure Laterality Date   COLONOSCOPY WITH PROPOFOL N/A 09/29/2016   Procedure: COLONOSCOPY WITH PROPOFOL;  Surgeon: Midge Minium, MD;  Location: [facility];  Service: Endoscopy;  Laterality: N/A;   KNEE ARTHROSCOPY WITH MEDIAL MENISECTOMY Left 11/27/2021   Procedure: KNEE ARTHROSCOPY WITH MEDIAL  MENISECTOMY;  Surgeon: Juanell Fairly, MD;  Location: [facility] ORS;  Service: Orthopedics;  Laterality: Left;   PROSTATE BIOPSY     [facility]-Negative    PROSTATECTOMY  10/17/2018   Done at [facility] ARTHROSCOPY Right    rotator cuff repair    Current Medications: Current Meds  Medication Sig   Ascorbic Acid (VITAMIN C) 1000 MG tablet Take 1 tablet by mouth daily.   atorvastatin (LIPITOR) 40 MG tablet TAKE 1 TABLET(40 MG) BY MOUTH DAILY (Patient taking differently: Take 40 mg by mouth daily. TAKE 1 TABLET(40 MG) BY MOUTH DAILY)   Black Elderberry 50 MG/5ML SYRP Take 5 mLs by mouth daily.   busPIRone (BUSPAR) 15 MG tablet TAKE 1 TABLET(15 MG) BY MOUTH TWICE DAILY (Patient taking differently: Take 15 mg by mouth 2 (two) times daily. TAKE 1 TABLET(15 MG) BY MOUTH TWICE DAILY)   celecoxib (CELEBREX) 100 MG capsule Take 1 capsule (100 mg total) by mouth 2 (two) times daily. In place of meloxicam   cyanocobalamin (VITAMIN B12) 1000 MCG tablet Take 1,000 mcg by mouth daily.   diclofenac Sodium (VOLTAREN) 1 % GEL Apply 4 g topically 4 (four) times daily.   fexofenadine (ALLEGRA) 180 MG tablet Take 1 tablet (180 mg total) by mouth daily.   fluticasone (FLONASE) 50 MCG/ACT nasal spray Place 2 sprays into both nostrils  daily.   GINKGO BILOBA EXTRACT PO Take 1 tablet by mouth daily.   loratadine (CLARITIN) 10 MG tablet Take 10 mg by mouth daily as needed for allergies.   montelukast (SINGULAIR) 10 MG tablet TAKE 1 TABLET(10 MG) BY MOUTH AT BEDTIME (Patient taking differently: Take 10 mg by mouth at bedtime. TAKE 1 TABLET(10 MG) BY MOUTH AT BEDTIME)   Multiple Vitamin (MULTI-VITAMIN) tablet Take 1 tablet by mouth daily.   Omega-3 Fatty Acids (RA FISH OIL) 1000 MG CAPS Take 1 capsule by mouth daily.    Papav-Phentolamine-Alprostadil (TRI-MIX IC) 1 each by Intracavernosal route daily as needed (qd).   tadalafil (CIALIS) 20 MG tablet Take 20 mg by mouth daily as needed for erectile dysfunction.    telmisartan (MICARDIS) 80 MG tablet Take 1 tablet (80 mg total) by mouth daily.   thiamine (VITAMIN B1) 100 MG tablet Take 100 mg by mouth daily.     Allergies:   Ace inhibitors and Codeine   Social History   Socioeconomic History   Marital status: Married    Spouse name: Not on file   Number of children: 2   Years of education: Not on file   Highest education level: Bachelor's degree (e.g., BA, AB, BS)  Occupational History   Occupation: Health and safety inspector   Tobacco Use   Smoking status: Never   Smokeless tobacco: Former    Types: Snuff, Chew   Tobacco comments:    as a child   Vaping Use   Vaping status: Never Used  Substance and Sexual Activity   Alcohol use: Yes    Alcohol/week: 1.0 standard drink of alcohol    Types: 1 Cans of beer per week    Comment: occasionally   Drug use: Never   Sexual activity: Yes    Partners: Female    Birth control/protection: Surgical    Comment: Prostatectomy 10/2018  Other Topics Concern   Not on file  Social History Narrative   Not on file   Social Drivers of Health   Financial Resource Strain: Low Risk  (06/14/2023)   Overall Financial Resource Strain (CARDIA)    Difficulty of Paying Living Expenses: Not hard at all  Food Insecurity: No Food Insecurity (06/14/2023)   Hunger Vital Sign    Worried About Running Out of Food in the Last Year: Never true    Ran Out of Food in the Last Year: Never true  Transportation Needs: No Transportation Needs (06/14/2023)   PRAPARE - Administrator, Civil Service (Medical): No    Lack of Transportation (Non-Medical): No  Physical Activity: Sufficiently Active (06/14/2023)   Exercise Vital Sign    Days of Exercise per Week: 5 days    Minutes of Exercise per Session: 40 min  Stress: No Stress Concern Present (06/14/2023)   [employer] of Occupational Health - Occupational Stress Questionnaire    Feeling of Stress : Only a little  Social Connections: Moderately Integrated  (06/14/2023)   Social Connection and Isolation Panel [NHANES]    Frequency of Communication with Friends and Family: More than three times a week    Frequency of Social Gatherings with Friends and Family: Twice a week    Attends Religious Services: More than 4 times per year    Active Member of [employer] or Organizations: No    Attends Engineer, structural: Not on file    Marital Status: Married     Family History: The patient's family history includes Alcohol abuse in  his father; Anxiety disorder in his mother; Arthritis in his father; COPD in his mother; Cancer in his maternal grandfather and paternal grandmother; Dementia in his father; Hyperlipidemia in his father and mother; Stroke in his father, mother, paternal grandfather, and paternal uncle. ROS:   Please see the history of present illness.    All 14 point review of systems negative except as described per history of present illness.  EKGs/Labs/Other Studies Reviewed:    The following studies were reviewed today:   EKG:  EKG Interpretation Date/Time:  Monday July 19 2023 14:36:51 EDT Ventricular Rate:  76 PR Interval:  152 QRS Duration:  92 QT Interval:  372 QTC Calculation: 418 R Axis:   -8  Text Interpretation: Normal sinus rhythm Moderate voltage criteria for LVH, may be normal variant Nonspecific ST abnormality Abnormal ECG When compared with ECG of 26-Nov-2021 08:34, Inverted T waves have replaced nonspecific T wave abnormality in Inferior leads Confirmed by Gypsy Balsam [phone]) on 07/19/2023 2:50:33 PM    Recent Labs: 06/18/2023: ALT 40; ALT 40; BUN 19; BUN 19; Creat 0.76; Creat 0.76; Hemoglobin 15.0; Platelets 205; Potassium 4.7; Potassium 4.7; Sodium 141; Sodium 141  Recent Lipid Panel    Component Value Date/Time   CHOL 108 06/18/2023 0806   CHOL 143 01/30/2016 0928   TRIG 144 06/18/2023 0806   HDL 31 (L) 06/18/2023 0806   HDL 30 (L) 01/30/2016 0928   CHOLHDL 3.5 06/18/2023 0806   LDLCALC 54  06/18/2023 0806    Physical Exam:    VS:  BP (!) 140/72 (BP Location: Right Arm, Patient Position: Sitting)   Pulse 76   Ht 6\' 1"  (1.854 m)   Wt 223 lb 6.4 oz (101.3 kg)   SpO2 96%   BMI 29.47 kg/m     Wt Readings from Last 3 Encounters:  07/19/23 223 lb 6.4 oz (101.3 kg)  07/05/23 224 lb 8 oz (101.8 kg)  06/15/23 221 lb 12.8 oz (100.6 kg)     GEN:  Well nourished, well developed in no acute distress HEENT: Normal NECK: No JVD; No carotid bruits LYMPHATICS: No lymphadenopathy CARDIAC: RRR, no murmurs, no rubs, no gallops RESPIRATORY:  Clear to auscultation without rales, wheezing or rhonchi  ABDOMEN: Soft, non-tender, non-distended MUSCULOSKELETAL:  No edema; No deformity  SKIN: Warm and dry NEUROLOGIC:  Alert and oriented x 3 PSYCHIATRIC:  Normal affect   ASSESSMENT:    1. Hypertension, benign   2. Elevated coronary artery calcium score 20.6, 54 percentile   3. Dyslipidemia    PLAN:    In order of problems listed above:  Elevated calcium score likely he is completely asymptomatic.  He can walk climb stairs with no difficulties.  The key will be risk factors modifications, I did calculate his 10 years predicted risk considering his calcium score he is a 10-year risk risk is 8.9% interestingly with gout calcium score 11.6 so actually this calcium score reduce chances for him to have a problem.  Obviously is very concerned about it we had a long discussion about potentially taking aspirin and in my opinion it would be reasonable to take 1 baby aspirin every single day.  We did talk also about need to exercise on the regular basis I recommended this 5 times a week 30 minutes moderate intensity exercise and I encouraged him to.  Will be more aggressive with his exercises.  Look like he is doing enough in terms of timing but not enough in terms of intensity.  He  is already on good diet low-carb Mediterranean diet would be good choice for him. Essential hypertension blood pressure  decently controlled for his visit in the office slightly on the higher side.  I will ask him to have an echocardiogram to assess left ventricular ejection fraction look for degree of left ventricular hypertrophy. Since he does have multiple risk factors for coronary artery disease I will schedule him to have vascular screening. Overall he got mild atherosclerosis will do some investigation which include LP(a) as well as vascular screening and echocardiogram.   Medication Adjustments/Labs and Tests Ordered: Current medicines are reviewed at length with the patient today.  Concerns regarding medicines are outlined above.  Orders Placed This Encounter  Procedures   EKG 12-Lead   No orders of the defined types were placed in this encounter.   Signed, Georgeanna Lea, MD, [facility]. 07/19/2023 3:26 PM    [facility]

## 2023-07-20 LAB — LIPOPROTEIN A (LPA): Lipoprotein (a): 11.8 nmol/L (ref <75.0)

## 2023-07-23 ENCOUNTER — Telehealth: Payer: Self-pay

## 2023-07-23 NOTE — Telephone Encounter (Signed)
 Left message on My Chart with normal lab results per Dr. Vanetta Shawl note. Routed to PCP.

## 2023-07-28 ENCOUNTER — Telehealth: Payer: Self-pay

## 2023-07-28 NOTE — Telephone Encounter (Signed)
 Pt viewed  Lab results on My Chart per Dr. Vanetta Shawl note. Routed to PCP.

## 2023-08-16 ENCOUNTER — Ambulatory Visit: Attending: Cardiology

## 2023-08-16 ENCOUNTER — Ambulatory Visit

## 2023-08-16 DIAGNOSIS — R0609 Other forms of dyspnea: Secondary | ICD-10-CM

## 2023-08-16 LAB — ECHOCARDIOGRAM COMPLETE
P 1/2 time: 751 ms
S' Lateral: 3.7 cm

## 2023-08-24 ENCOUNTER — Ambulatory Visit: Payer: Self-pay

## 2023-08-24 NOTE — Telephone Encounter (Signed)
-----   Message from Ralene Burger sent at 08/19/2023  4:57 PM EDT ----- Vascular screening negative

## 2023-08-24 NOTE — Telephone Encounter (Signed)
-----   Message from Ralene Burger sent at 08/19/2023  4:48 PM EDT ----- Echocardiogram showed preserved left ventricle ejection fraction overall looks good

## 2023-08-24 NOTE — Telephone Encounter (Signed)
 Patient notified of vascular results through my chart

## 2023-08-24 NOTE — Telephone Encounter (Addendum)
 Patient notified of Echo results through my chart.

## 2023-09-08 ENCOUNTER — Encounter (HOSPITAL_COMMUNITY): Payer: Self-pay | Admitting: Cardiology

## 2023-10-18 ENCOUNTER — Ambulatory Visit: Attending: Cardiology | Admitting: Cardiology

## 2023-10-18 ENCOUNTER — Encounter: Payer: Self-pay | Admitting: Cardiology

## 2023-10-18 VITALS — BP 130/80 | Resp 18 | Ht 73.0 in | Wt 221.8 lb

## 2023-10-18 DIAGNOSIS — I10 Essential (primary) hypertension: Secondary | ICD-10-CM | POA: Diagnosis not present

## 2023-10-18 DIAGNOSIS — Z131 Encounter for screening for diabetes mellitus: Secondary | ICD-10-CM | POA: Diagnosis not present

## 2023-10-18 DIAGNOSIS — E785 Hyperlipidemia, unspecified: Secondary | ICD-10-CM | POA: Diagnosis not present

## 2023-10-18 DIAGNOSIS — R931 Abnormal findings on diagnostic imaging of heart and coronary circulation: Secondary | ICD-10-CM

## 2023-10-18 NOTE — Addendum Note (Signed)
 Addended by: ARLOA PLANAS D on: 10/18/2023 01:44 PM   Modules accepted: Orders

## 2023-10-18 NOTE — Progress Notes (Signed)
 Cardiology Office Note:    Date:  10/18/2023   ID:  Gavin Scott, DOB May 20, 1965, MRN [id number]  PCP:  Sowles, Krichna, MD  Cardiologist:  Lamar Fitch, MD    Referring MD: Sowles, Krichna, MD   No chief complaint on file.   History of Present Illness:    Gavin Scott is a 58 y.o. male past medical history significant for elevated calcium  score, his calcium  score was 20.6 which is 54th percentile.  Problem include essential hypertension dyslipidemia borderline diabetes.  He is taking statin as versus aspirin  which I advised to continue.  Comes today for follow-up overall doing well.  Denies have any chest pain tightness squeezing pressure burning chest.  Trying to be active and exercise on a regular basis.  He is hemoglobin 8C was 5.7  Past Medical History:  Diagnosis Date   Allergy    Anxiety    Arthritis 2016   Basal cell carcinoma 04/09/2014   Right upper arm   Circadian rhythm disorder    Hyperlipidemia    Hypertension    Metabolic syndrome    Obesity    Perforation of left tympanic membrane     Past Surgical History:  Procedure Laterality Date   COLONOSCOPY WITH PROPOFOL  N/A 09/29/2016   Procedure: COLONOSCOPY WITH PROPOFOL ;  Surgeon: Jinny Carmine, MD;  Location: [facility];  Service: Endoscopy;  Laterality: N/A;   KNEE ARTHROSCOPY WITH MEDIAL MENISECTOMY Left 11/27/2021   Procedure: KNEE ARTHROSCOPY WITH MEDIAL MENISECTOMY;  Surgeon: Marchia Drivers, MD;  Location: [facility] ORS;  Service: Orthopedics;  Laterality: Left;   PROSTATE BIOPSY     [facility]-Negative    PROSTATECTOMY  10/17/2018   Done at [facility] ARTHROSCOPY Right    rotator cuff repair    Current Medications: Current Meds  Medication Sig   Ascorbic Acid (VITAMIN C) 1000 MG tablet Take 1 tablet by mouth daily.   aspirin  EC 81 MG tablet Take 81 mg by mouth daily. Swallow whole.   atorvastatin  (LIPITOR) 40 MG tablet TAKE 1 TABLET(40 MG) BY MOUTH DAILY   Black Elderberry 50 MG/5ML SYRP  Take 5 mLs by mouth daily.   busPIRone  (BUSPAR ) 15 MG tablet TAKE 1 TABLET(15 MG) BY MOUTH TWICE DAILY   celecoxib  (CELEBREX ) 100 MG capsule Take 1 capsule (100 mg total) by mouth 2 (two) times daily. In place of meloxicam    cyanocobalamin (VITAMIN B12) 1000 MCG tablet Take 1,000 mcg by mouth daily.   diclofenac  Sodium (VOLTAREN ) 1 % GEL Apply 4 g topically 4 (four) times daily.   fexofenadine  (ALLEGRA ) 180 MG tablet Take 1 tablet (180 mg total) by mouth daily.   fluticasone  (FLONASE ) 50 MCG/ACT nasal spray Place 2 sprays into both nostrils daily.   GINKGO BILOBA EXTRACT PO Take 1 tablet by mouth daily.   montelukast  (SINGULAIR ) 10 MG tablet TAKE 1 TABLET(10 MG) BY MOUTH AT BEDTIME   Multiple Vitamin (MULTI-VITAMIN) tablet Take 1 tablet by mouth daily.   Omega-3 Fatty Acids (RA FISH OIL) 1000 MG CAPS Take 1 capsule by mouth daily.    tadalafil  (CIALIS ) 20 MG tablet Take 20 mg by mouth daily as needed for erectile dysfunction.   telmisartan  (MICARDIS ) 80 MG tablet Take 1 tablet (80 mg total) by mouth daily.   thiamine (VITAMIN B1) 100 MG tablet Take 100 mg by mouth daily.   [DISCONTINUED] loratadine  (CLARITIN ) 10 MG tablet Take 10 mg by mouth daily as needed for allergies.     Allergies:   Ace inhibitors and  Codeine   Social History   Socioeconomic History   Marital status: Married    Spouse name: Not on file   Number of children: 2   Years of education: Not on file   Highest education level: Bachelor's degree (e.g., BA, AB, BS)  Occupational History   Occupation: Health and safety inspector   Tobacco Use   Smoking status: Never   Smokeless tobacco: Former    Types: Snuff, Chew   Tobacco comments:    as a child   Vaping Use   Vaping status: Never Used  Substance and Sexual Activity   Alcohol use: Yes    Alcohol/week: 1.0 standard drink of alcohol    Types: 1 Cans of beer per week    Comment: occasionally   Drug use: Never   Sexual activity: Yes    Partners: Female    Birth  control/protection: Surgical    Comment: Prostatectomy 10/2018  Other Topics Concern   Not on file  Social History Narrative   Not on file   Social Drivers of Health   Financial Resource Strain: Low Risk  (06/14/2023)   Overall Financial Resource Strain (CARDIA)    Difficulty of Paying Living Expenses: Not hard at all  Food Insecurity: No Food Insecurity (06/14/2023)   Hunger Vital Sign    Worried About Running Out of Food in the Last Year: Never true    Ran Out of Food in the Last Year: Never true  Transportation Needs: No Transportation Needs (06/14/2023)   PRAPARE - Administrator, Civil Service (Medical): No    Lack of Transportation (Non-Medical): No  Physical Activity: Sufficiently Active (06/14/2023)   Exercise Vital Sign    Days of Exercise per Week: 5 days    Minutes of Exercise per Session: 40 min  Stress: No Stress Concern Present (06/14/2023)   [employer] of Occupational Health - Occupational Stress Questionnaire    Feeling of Stress : Only a little  Social Connections: Moderately Integrated (06/14/2023)   Social Connection and Isolation Panel    Frequency of Communication with Friends and Family: More than three times a week    Frequency of Social Gatherings with Friends and Family: Twice a week    Attends Religious Services: More than 4 times per year    Active Member of [employer] or Organizations: No    Attends Engineer, structural: Not on file    Marital Status: Married     Family History: The patient's family history includes Alcohol abuse in his father; Anxiety disorder in his mother; Arthritis in his father; COPD in his mother; Cancer in his maternal grandfather and paternal grandmother; Dementia in his father; Hyperlipidemia in his father and mother; Stroke in his father, mother, paternal grandfather, and paternal uncle. ROS:   Please see the history of present illness.    All 14 point review of systems negative except as described per history of  present illness  EKGs/Labs/Other Studies Reviewed:         Recent Labs: 06/18/2023: ALT 40; ALT 40; BUN 19; BUN 19; Creat 0.76; Creat 0.76; Hemoglobin 15.0; Platelets 205; Potassium 4.7; Potassium 4.7; Sodium 141; Sodium 141  Recent Lipid Panel    Component Value Date/Time   CHOL 108 06/18/2023 0806   CHOL 143 01/30/2016 0928   TRIG 144 06/18/2023 0806   HDL 31 (L) 06/18/2023 0806   HDL 30 (L) 01/30/2016 0928   CHOLHDL 3.5 06/18/2023 0806   LDLCALC 54 06/18/2023 0806  Physical Exam:    VS:  BP 130/80 (BP Location: Left Arm, Patient Position: Sitting, Cuff Size: Normal)   Resp 18   Ht 6' 1 (1.854 m)   Wt 221 lb 12.8 oz (100.6 kg)   SpO2 96%   BMI 29.26 kg/m     Wt Readings from Last 3 Encounters:  10/18/23 221 lb 12.8 oz (100.6 kg)  07/19/23 223 lb 6.4 oz (101.3 kg)  07/05/23 224 lb 8 oz (101.8 kg)     GEN:  Well nourished, well developed in no acute distress HEENT: Normal NECK: No JVD; No carotid bruits LYMPHATICS: No lymphadenopathy CARDIAC: RRR, no murmurs, no rubs, no gallops RESPIRATORY:  Clear to auscultation without rales, wheezing or rhonchi  ABDOMEN: Soft, non-tender, non-distended MUSCULOSKELETAL:  No edema; No deformity  SKIN: Warm and dry LOWER EXTREMITIES: no swelling NEUROLOGIC:  Alert and oriented x 3 PSYCHIATRIC:  Normal affect   ASSESSMENT:    1. Elevated coronary artery calcium  score 20.6, 54 percentile   2. Hypertension, benign   3. Dyslipidemia    PLAN:    In order of problems listed above:  Elevated calcium  score again we stressed importance of exercising on the regular basis good diet sticking with the Mediterranean diet. Need to exercise on the regular basis 5 times a week 30 minutes moderate intensity exercise. Hypertension blood pressure well-controlled continue present management. Dyslipidemia I did review K PN which show me his LDL of 54 HDL 31.  This is from March of this year continue present management.  He is having LP(a)  was very low, his vascular screening was negative Prediabetes we spent great of time talking about this I encouraged him to get CGM and continue monitoring.  Will recheck hemogram once he   Medication Adjustments/Labs and Tests Ordered: Current medicines are reviewed at length with the patient today.  Concerns regarding medicines are outlined above.  No orders of the defined types were placed in this encounter.  Medication changes: No orders of the defined types were placed in this encounter.   Signed, Lamar DOROTHA Fitch, MD, [facility] 10/18/2023 1:35 PM    [facility]

## 2023-10-18 NOTE — Patient Instructions (Signed)
 Medication Instructions:  Your physician recommends that you continue on your current medications as directed. Please refer to the Current Medication list given to you today.  *If you need a refill on your cardiac medications before your next appointment, please call your pharmacy*   Lab Work: Your physician recommends that you return for lab work in: when fasting You need to have labs done when you are fasting.  You can come Monday through Friday 8:30 am to 12:00 pm and 1:15 to 4:30. You do not need to make an appointment as the order has already been placed. The labs you are going to have done are Lipid, A1C.    Testing/Procedures: None Ordered   Follow-Up: At [facility], you and your health needs are our priority.  As part of our continuing mission to provide you with exceptional heart care, we have created designated Provider Care Teams.  These Care Teams include your primary Cardiologist (physician) and Advanced Practice Providers (APPs -  Physician Assistants and Nurse Practitioners) who all work together to provide you with the care you need, when you need it.  We recommend signing up for the patient portal called MyChart.  Sign up information is provided on this After Visit Summary.  MyChart is used to connect with patients for Virtual Visits (Telemedicine).  Patients are able to view lab/test results, encounter notes, upcoming appointments, etc.  Non-urgent messages can be sent to your provider as well.   To learn more about what you can do with MyChart, go to [email or web address].    Your next appointment:   12 month(s)  The format for your next appointment:   In Person  Provider:   Lamar Fitch, MD    Other Instructions NA

## 2023-11-16 ENCOUNTER — Ambulatory Visit: Admitting: Dermatology

## 2023-11-16 ENCOUNTER — Encounter: Payer: Self-pay | Admitting: Dermatology

## 2023-11-16 DIAGNOSIS — Z85828 Personal history of other malignant neoplasm of skin: Secondary | ICD-10-CM

## 2023-11-16 DIAGNOSIS — Z1283 Encounter for screening for malignant neoplasm of skin: Secondary | ICD-10-CM | POA: Diagnosis not present

## 2023-11-16 DIAGNOSIS — W908XXA Exposure to other nonionizing radiation, initial encounter: Secondary | ICD-10-CM

## 2023-11-16 DIAGNOSIS — L814 Other melanin hyperpigmentation: Secondary | ICD-10-CM

## 2023-11-16 DIAGNOSIS — I781 Nevus, non-neoplastic: Secondary | ICD-10-CM

## 2023-11-16 DIAGNOSIS — D239 Other benign neoplasm of skin, unspecified: Secondary | ICD-10-CM

## 2023-11-16 DIAGNOSIS — D2361 Other benign neoplasm of skin of right upper limb, including shoulder: Secondary | ICD-10-CM | POA: Diagnosis not present

## 2023-11-16 DIAGNOSIS — Z7189 Other specified counseling: Secondary | ICD-10-CM

## 2023-11-16 DIAGNOSIS — L820 Inflamed seborrheic keratosis: Secondary | ICD-10-CM | POA: Diagnosis not present

## 2023-11-16 DIAGNOSIS — L578 Other skin changes due to chronic exposure to nonionizing radiation: Secondary | ICD-10-CM

## 2023-11-16 DIAGNOSIS — D229 Melanocytic nevi, unspecified: Secondary | ICD-10-CM

## 2023-11-16 NOTE — Progress Notes (Signed)
 Follow-Up Visit   Subjective  Gavin Scott is a 58 y.o. male who presents for the following: Skin Cancer Screening and Full Body Skin Exam Hx of bcc Hx of aks, isks, hx of moles that we measure, hx of neurofibroma, hx of rosacea not on treatment, hx of fibrous papule at nose  Today reports a spot at scalp that did not go away after treated with Ln2 , other spots at left arm, back, right arm and chest he would like checked.   The patient presents for Total-Body Skin Exam (TBSE) for skin cancer screening and mole check. The patient has spots, moles and lesions to be evaluated, some may be new or changing and the patient may have concern these could be cancer.  The following portions of the chart were reviewed this encounter and updated as appropriate: medications, allergies, medical history  Review of Systems:  No other skin or systemic complaints except as noted in HPI or Assessment and Plan.  Objective  Well appearing patient in no apparent distress; mood and affect are within normal limits.  A full examination was performed including scalp, head, eyes, ears, nose, lips, neck, chest, axillae, abdomen, back, buttocks, bilateral upper extremities, bilateral lower extremities, hands, feet, fingers, toes, fingernails, and toenails. All findings within normal limits unless otherwise noted below.   Relevant physical exam findings are noted in the Assessment and Plan.  left scalp x 1, low back spinal x 1, left middle lateral tricep x 1 (3) Erythematous stuck-on, waxy papule or plaque  Assessment & Plan   SKIN CANCER SCREENING PERFORMED TODAY.  ACTINIC DAMAGE - Chronic condition, secondary to cumulative UV/sun exposure - diffuse scaly erythematous macules with underlying dyspigmentation - Recommend daily broad spectrum sunscreen SPF 30+ to sun-exposed areas, reapply every 2 hours as needed.  - Staying in the shade or wearing long sleeves, sun glasses (UVA+UVB protection) and wide brim hats  (4-inch brim around the entire circumference of the hat) are also recommended for sun protection.  - Call for new or changing lesions.  LENTIGINES, SEBORRHEIC KERATOSES, HEMANGIOMAS - Benign normal skin lesions - Benign-appearing - Call for any changes  TELANGIECTASIA Exam: dilated blood vessel face Treatment Plan: Benign appearing on exam Call for changes Counseling for BBL / IPL / Laser and Coordination of Care Discussed the treatment option of Broad Band Light (BBL) /Intense Pulsed Light (IPL)/ Laser for skin discoloration, including brown spots and redness.  Typically we recommend at least 1-3 treatment sessions about 5-8 weeks apart for best results.  Cannot have tanned skin when BBL performed, and regular use of sunscreen/photoprotection is advised after the procedure to help maintain results. The patient's condition may also require maintenance treatments in the future.  The fee for BBL / laser treatments is $350 per treatment session for the whole face.  A fee can be quoted for other parts of the body.  Insurance typically does not pay for BBL/laser treatments and therefore the fee is an out-of-pocket cost. Recommend prophylactic valtrex treatment. Once scheduled for procedure, will send Rx in prior to patient's appointment.    MELANOCYTIC NEVI 0.4 cm flesh papule on the L chest, stable  0.5 cm waxy flesh lobulated papule vs SK on the L med chest, stable 0.5 x 0.3 cm brown macule R abdomen, stable 0.4 x 0.3 cm med brown macule L post ankle, stable - Tan-brown and/or pink-flesh-colored symmetric macules and papules - Benign appearing on exam today - Observation - Call clinic for new or changing  moles - Recommend daily use of broad spectrum spf 30+ sunscreen to sun-exposed areas.  Benign-appearing. Stable compared to previous visit. Observation.  Call clinic for new or changing moles.  Recommend daily use of broad spectrum spf 30+ sunscreen to sun-exposed areas.   Neurofibroma vs  nevus  - 0.4 cm soft flesh papule on the R upper arm above the elbow.   Stable compared to previous visit. Benign-appearing.  Observation.  Call clinic for new or changing lesions.  Recommend daily use of broad spectrum spf 30+ sunscreen to sun-exposed areas.   DERMATOFIBROMA Exam: Firm pink/brown papulenodule with dimple sign. At left medial upper thigh Treatment Plan: A dermatofibroma is a benign growth possibly related to trauma, such as an insect bite, cut from shaving, or inflamed acne-type bump.  Treatment options to remove include shave or excision with resulting scar and risk of recurrence.  Since benign-appearing and not bothersome, will observe for now.   HISTORY OF BASAL CELL CARCINOMA OF THE SKIN 04/09/2014   Right posterior shoulder  Excision - attempted to call [facility] about pathology- was told unable to open due to being over 10 years dated 10/28/2011 done by Dr. Towana  - No evidence of recurrence today - Recommend regular full body skin exams - Recommend daily broad spectrum sunscreen SPF 30+ to sun-exposed areas, reapply every 2 hours as needed.  - Call if any new or changing lesions are noted between office visits  INFLAMED SEBORRHEIC KERATOSIS (3) left scalp x 1, low back spinal x 1, left middle lateral tricep x 1 (3) Symptomatic, irritating, patient would like treated. Destruction of lesion - left scalp x 1, low back spinal x 1, left middle lateral tricep x 1 (3) Complexity: simple   Destruction method: cryotherapy   Informed consent: discussed and consent obtained   Timeout:  patient name, date of birth, surgical site, and procedure verified Lesion destroyed using liquid nitrogen: Yes   Region frozen until ice ball extended beyond lesion: Yes   Outcome: patient tolerated procedure well with no complications   Post-procedure details: wound care instructions given    HISTORY OF BASAL CELL CARCINOMA   ACTINIC SKIN DAMAGE   LENTIGO   MELANOCYTIC NEVUS,  UNSPECIFIED LOCATION   SKIN CANCER SCREENING   Return in about 1 year (around 11/15/2024) for TBSE.  IEleanor Blush, CMA, am acting as scribe for Alm Rhyme, MD.   Documentation: I have reviewed the above documentation for accuracy and completeness, and I agree with the above.  Alm Rhyme, MD

## 2023-11-16 NOTE — Patient Instructions (Signed)

## 2023-12-30 ENCOUNTER — Other Ambulatory Visit: Payer: Self-pay | Admitting: Family Medicine

## 2023-12-30 DIAGNOSIS — E785 Hyperlipidemia, unspecified: Secondary | ICD-10-CM

## 2023-12-30 DIAGNOSIS — I10 Essential (primary) hypertension: Secondary | ICD-10-CM

## 2023-12-30 DIAGNOSIS — F411 Generalized anxiety disorder: Secondary | ICD-10-CM

## 2023-12-30 DIAGNOSIS — J302 Other seasonal allergic rhinitis: Secondary | ICD-10-CM

## 2024-01-05 ENCOUNTER — Encounter: Payer: Self-pay | Admitting: Family Medicine

## 2024-01-05 ENCOUNTER — Ambulatory Visit: Admitting: Family Medicine

## 2024-01-05 VITALS — BP 114/72 | HR 75 | Resp 16 | Ht 73.0 in | Wt 220.7 lb

## 2024-01-05 DIAGNOSIS — F411 Generalized anxiety disorder: Secondary | ICD-10-CM

## 2024-01-05 DIAGNOSIS — J3089 Other allergic rhinitis: Secondary | ICD-10-CM

## 2024-01-05 DIAGNOSIS — Z820 Family history of epilepsy and other diseases of the nervous system: Secondary | ICD-10-CM

## 2024-01-05 DIAGNOSIS — R7303 Prediabetes: Secondary | ICD-10-CM | POA: Diagnosis not present

## 2024-01-05 DIAGNOSIS — Z8546 Personal history of malignant neoplasm of prostate: Secondary | ICD-10-CM

## 2024-01-05 DIAGNOSIS — J302 Other seasonal allergic rhinitis: Secondary | ICD-10-CM

## 2024-01-05 DIAGNOSIS — M1732 Unilateral post-traumatic osteoarthritis, left knee: Secondary | ICD-10-CM

## 2024-01-05 DIAGNOSIS — E785 Hyperlipidemia, unspecified: Secondary | ICD-10-CM

## 2024-01-05 DIAGNOSIS — Z23 Encounter for immunization: Secondary | ICD-10-CM | POA: Diagnosis not present

## 2024-01-05 DIAGNOSIS — K219 Gastro-esophageal reflux disease without esophagitis: Secondary | ICD-10-CM

## 2024-01-05 DIAGNOSIS — I10 Essential (primary) hypertension: Secondary | ICD-10-CM

## 2024-01-05 MED ORDER — MONTELUKAST SODIUM 10 MG PO TABS: ORAL_TABLET | ORAL | 1 refills | Status: AC

## 2024-01-05 MED ORDER — CELECOXIB 100 MG PO CAPS: 100.0000 mg | ORAL_CAPSULE | Freq: Two times a day (BID) | ORAL | 1 refills | Status: AC

## 2024-01-05 MED ORDER — BUSPIRONE HCL 15 MG PO TABS: ORAL_TABLET | ORAL | 1 refills | Status: AC

## 2024-01-05 MED ORDER — FLUTICASONE PROPIONATE 50 MCG/ACT NA SUSP: 2.0000 | Freq: Every day | NASAL | 1 refills | Status: AC

## 2024-01-05 MED ORDER — TELMISARTAN 80 MG PO TABS: 80.0000 mg | ORAL_TABLET | Freq: Every day | ORAL | 1 refills | Status: AC

## 2024-01-05 MED ORDER — DEXCOM G7 SENSOR MISC: 1.0000 | 5 refills | Status: AC

## 2024-01-05 MED ORDER — FEXOFENADINE HCL 180 MG PO TABS: 180.0000 mg | ORAL_TABLET | Freq: Every day | ORAL | 1 refills | Status: AC

## 2024-01-05 MED ORDER — ATORVASTATIN CALCIUM 40 MG PO TABS: ORAL_TABLET | ORAL | 1 refills | Status: AC

## 2024-01-05 NOTE — Progress Notes (Signed)
 Name: Gavin Scott   MRN: [id number]    DOB: 06-29-1965   Date:01/05/2024       Progress Note  Subjective  Chief Complaint  Chief Complaint  Patient presents with   Medical Management of Chronic Issues   Discussed the use of AI scribe software for clinical note transcription with the patient, who gave verbal consent to proceed.  History of Present Illness Gavin Scott is a 58 year old male with hypertension and knee arthritis who presents for follow-up on his blood pressure and knee pain management.  He has experienced significant improvement in knee pain following a recent cortisone injection. Previously, the pain was rated as a 'twelve' on a scale of zero to 10 , severely limiting his ability to walk or stand from a seated position. Post-injection, the pain is now rated as a 'point five', allowing him to stand without assistance and engage in activities like running on trails and weight training. He previously tried hyaluronic acid injections without success. He uses Celebrex  twice daily and applies a CBD gel for additional relief.  He takes telmisartan  80 mg daily for hypertension. His blood pressure readings have improved from 130/80 mmHg to 114/72 mmHg. No dizziness, orthostatic changes, chest pain, or palpitations are reported.  He has dyslipidemia and prediabetes, managed with atorvastatin  and baby aspirin . He has made dietary changes, reducing sugar intake and being more mindful of snacking.  He has generalized anxiety disorder, managed with Buspar  5 mg twice daily, which he finds effective.  He has a history of prostate cancer, currently five years post-surgery, and is monitored with PSA tests.  He experiences seasonal allergies, managed with Flonase , Allegra , Singulair , and occasionally Claritin  D.  No current symptoms of reflux or indigestion are reported.    Patient Active Problem List   Diagnosis Date Noted   Elevated coronary artery calcium  score 20.6, 54 percentile  07/19/2023   Family history of Alzheimer's disease 12/11/2022   Family history of heart disease 12/11/2022   APOE*3/*4 genotype 12/11/2022   Osteoarthritis of left knee 10/12/2022   Pain in joint of left knee 10/12/2022   Patellar tendinitis of left knee 10/30/2021   Osteoarthritis of fingers of both hands 10/30/2021   History of prostate cancer 10/30/2021   Hydrocele in adult 05/16/2021   Rectal polyp    Benign neoplasm of ascending colon    GERD without esophagitis 08/19/2015   GAD (generalized anxiety disorder) 12/18/2014   Basal cell carcinoma 12/18/2014   Hypertension, benign 12/18/2014   Circadian rhythm sleep disorder 12/18/2014   Dyslipidemia 12/18/2014   Perennial allergic rhinitis with seasonal variation 12/18/2014   Ear drum perforation 12/18/2014   Motion sickness 12/18/2014   Engages in travel abroad 12/18/2014   Dysmetabolic syndrome 12/18/2014    Past Surgical History:  Procedure Laterality Date   COLONOSCOPY WITH PROPOFOL  N/A 09/29/2016   Procedure: COLONOSCOPY WITH PROPOFOL ;  Surgeon: Jinny Carmine, MD;  [facility];  Service: Endoscopy;  Laterality: N/A;   KNEE ARTHROSCOPY WITH MEDIAL MENISECTOMY Left 11/27/2021   Procedure: KNEE ARTHROSCOPY WITH MEDIAL MENISECTOMY;  Surgeon: Marchia Drivers, MD;  Location: [facility] ORS;  Service: Orthopedics;  Laterality: Left;   PROSTATE BIOPSY     [facility]-Negative    PROSTATECTOMY  10/17/2018   Done at [facility] ARTHROSCOPY Right    rotator cuff repair    Family History  Problem Relation Age of Onset   Stroke Mother    Hyperlipidemia Mother    Anxiety disorder Mother  COPD Mother    Alcohol abuse Father    Stroke Father    Hyperlipidemia Father    Dementia Father    Arthritis Father    Cancer Maternal Grandfather    Cancer Paternal Grandmother    Stroke Paternal Grandfather    Stroke Paternal Uncle     Social History   Tobacco Use   Smoking status: Never   Smokeless tobacco:  Former    Types: Snuff, Chew   Tobacco comments:    as a child   Substance Use Topics   Alcohol use: Yes    Alcohol/week: 1.0 standard drink of alcohol    Types: 1 Cans of beer per week    Comment: occasionally     Current Outpatient Medications:    Ascorbic Acid (VITAMIN C) 1000 MG tablet, Take 1 tablet by mouth daily., Disp: , Rfl:    aspirin  EC 81 MG tablet, Take 81 mg by mouth daily. Swallow whole., Disp: , Rfl:    Black Elderberry 50 MG/5ML SYRP, Take 5 mLs by mouth daily., Disp: 240 mL, Rfl: 0   Continuous Glucose Sensor (DEXCOM G7 SENSOR) MISC, 1 each by Does not apply route as directed., Disp: 1 each, Rfl: 5   cyanocobalamin (VITAMIN B12) 1000 MCG tablet, Take 1,000 mcg by mouth daily., Disp: , Rfl:    diclofenac  Sodium (VOLTAREN ) 1 % GEL, Apply 4 g topically 4 (four) times daily., Disp: 300 g, Rfl: 1   GINKGO BILOBA EXTRACT PO, Take 1 tablet by mouth daily., Disp: , Rfl:    Multiple Vitamin (MULTI-VITAMIN) tablet, Take 1 tablet by mouth daily., Disp: , Rfl:    tadalafil  (CIALIS ) 20 MG tablet, Take 20 mg by mouth daily as needed for erectile dysfunction., Disp: , Rfl:    thiamine (VITAMIN B1) 100 MG tablet, Take 100 mg by mouth daily., Disp: , Rfl:    atorvastatin  (LIPITOR) 40 MG tablet, TAKE 1 TABLET(40 MG) BY MOUTH DAILY, Disp: 90 tablet, Rfl: 1   busPIRone  (BUSPAR ) 15 MG tablet, TAKE 1 TABLET(15 MG) BY MOUTH TWICE DAILY, Disp: 180 tablet, Rfl: 1   celecoxib  (CELEBREX ) 100 MG capsule, Take 1 capsule (100 mg total) by mouth 2 (two) times daily. In place of meloxicam , Disp: 180 capsule, Rfl: 1   fexofenadine  (ALLEGRA ) 180 MG tablet, Take 1 tablet (180 mg total) by mouth daily., Disp: 90 tablet, Rfl: 1   fluticasone  (FLONASE ) 50 MCG/ACT nasal spray, Place 2 sprays into both nostrils daily., Disp: 48 g, Rfl: 1   montelukast  (SINGULAIR ) 10 MG tablet, TAKE 1 TABLET(10 MG) BY MOUTH AT BEDTIME, Disp: 90 tablet, Rfl: 1   telmisartan  (MICARDIS ) 80 MG tablet, Take 1 tablet (80 mg total)  by mouth daily., Disp: 90 tablet, Rfl: 1  Allergies  Allergen Reactions   Ace Inhibitors Cough   Codeine Hives and Itching    I personally reviewed active problem list, medication list, allergies, family history with the patient/caregiver today.   ROS  Ten systems reviewed and is negative except as mentioned in HPI    Objective Physical Exam  CONSTITUTIONAL: Patient appears well-developed and well-nourished.  No distress. HEENT: Head atraumatic, normocephalic, neck supple. CARDIOVASCULAR: Normal rate, regular rhythm and normal heart sounds.  No murmur heard. No BLE edema. PULMONARY: Effort normal and breath sounds normal. No respiratory distress. ABDOMINAL: There is no tenderness or distention. MUSCULOSKELETAL: Normal gait.  PSYCHIATRIC: Patient has a normal mood and affect. behavior is normal. Judgment and thought content normal.  Vitals:   01/05/24  0815  BP: 114/72  Pulse: 75  Resp: 16  SpO2: 97%  Weight: 220 lb 11.2 oz (100.1 kg)  Height: 6' 1 (1.854 m)    Body mass index is 29.12 kg/m.    PHQ2/9:    01/05/2024    8:08 AM 07/05/2023    8:16 AM 06/15/2023    2:21 PM 05/20/2023    8:17 AM 02/08/2023    8:58 AM  Depression screen PHQ 2/9  Decreased Interest 0 0 0 0 0  Down, Depressed, Hopeless 0 0 0 0 0  PHQ - 2 Score 0 0 0 0 0  Altered sleeping  0 0 0 0  Tired, decreased energy  0 0 0 0  Change in appetite  0 0 0 0  Feeling bad or failure about yourself   0 0 0 0  Trouble concentrating  0 0 0 0  Moving slowly or fidgety/restless  0 0 0 0  Suicidal thoughts  0 0 0 0  PHQ-9 Score  0 0 0 0  Difficult doing work/chores  Not difficult at all Not difficult at all Not difficult at all Not difficult at all    phq 9 is negative  Fall Risk:    01/05/2024    8:08 AM 05/20/2023    8:15 AM 02/08/2023    8:58 AM 12/11/2022    2:09 PM 05/29/2022    7:38 AM  Fall Risk   Falls in the past year? 0 0 0 0 1  Number falls in past yr: 0 0 0  0  Injury with Fall? 0 0 0  0   Risk for fall due to : No Fall Risks No Fall Risks No Fall Risks No Fall Risks No Fall Risks  Follow up Falls evaluation completed Falls prevention discussed;Education provided;Falls evaluation completed Falls prevention discussed;Education provided;Falls evaluation completed Falls prevention discussed Falls prevention discussed      Assessment & Plan Osteoarthritis of right knee, post-traumatic, with history of meniscus tear and surgery Significant pain reduction post-corticosteroid injection, enabling physical activity. Injection delayed knee replacement. - Continue Celebrex  twice daily. - Consider corticosteroid injections as needed. - Use CBD gel for additional pain relief as needed.  Hypertension Well-controlled with telmisartan  80 mg. Blood pressure improved to 114/72 mmHg without orthostatic symptoms. - Continue telmisartan  80 mg daily.  Hyperlipidemia Managed with atorvastatin . LDL cholesterol at 54 mg/dL. - Continue atorvastatin  as prescribed. - Refill atorvastatin  prescription.  Prediabetes Monitoring with dietary modifications. Advised CGM use for diet impact assessment. - Send prescription for Dexcom CGM to local pharmacy for price comparison. - Advise purchase of CGM online if cost-effective. - Encourage continued dietary modifications.  Generalized anxiety disorder Managed with Buspar  5 mg twice daily, effective in symptom control. - Continue Buspar  5 mg twice daily.  Allergic rhinitis Controlled with Flonase , Allegra , Singulair , and occasional Claritin  D. - Continue Flonase , Allegra , Singulair . - Use Claritin  D as needed during fall.  History of prostate cancer, post-prostatectomy, under surveillance Five years post-surgery with satisfactory PSA levels. Follow-up with urologist scheduled. - Attend follow-up with urologist next month. - Discuss future PSA monitoring frequency with urologist.

## 2024-02-28 ENCOUNTER — Ambulatory Visit: Payer: 59 | Admitting: Dermatology

## 2024-06-15 ENCOUNTER — Encounter: Admitting: Family Medicine

## 2024-07-04 ENCOUNTER — Ambulatory Visit: Admitting: Family Medicine

## 2024-11-15 ENCOUNTER — Ambulatory Visit: Admitting: Dermatology
